# Patient Record
Sex: Male | Born: 1950 | Hispanic: No | Marital: Married | Smoking: Never smoker
Health system: Southern US, Community
[De-identification: ages and names within clinical notes are randomized; demographics above are authoritative.]

## PROBLEM LIST (undated history)

## (undated) DIAGNOSIS — I499 Cardiac arrhythmia, unspecified: Secondary | ICD-10-CM

## (undated) DIAGNOSIS — R002 Palpitations: Secondary | ICD-10-CM

## (undated) DIAGNOSIS — F32A Depression, unspecified: Secondary | ICD-10-CM

## (undated) DIAGNOSIS — I480 Paroxysmal atrial fibrillation: Secondary | ICD-10-CM

## (undated) DIAGNOSIS — F329 Major depressive disorder, single episode, unspecified: Secondary | ICD-10-CM

## (undated) DIAGNOSIS — M1990 Unspecified osteoarthritis, unspecified site: Secondary | ICD-10-CM

## (undated) HISTORY — DX: Palpitations: R00.2

## (undated) HISTORY — DX: Depression, unspecified: F32.A

## (undated) HISTORY — DX: Major depressive disorder, single episode, unspecified: F32.9

## (undated) HISTORY — DX: Paroxysmal atrial fibrillation: I48.0

## (undated) HISTORY — PX: INGUINAL HERNIA REPAIR: SUR1180

## (undated) HISTORY — PX: TOE SURGERY: SHX1073

---

## 2003-05-12 ENCOUNTER — Encounter: Payer: Self-pay | Admitting: Endocrinology

## 2003-05-12 ENCOUNTER — Encounter: Admission: RE | Admit: 2003-05-12 | Discharge: 2003-05-12 | Payer: Self-pay | Admitting: Endocrinology

## 2004-08-28 ENCOUNTER — Ambulatory Visit (HOSPITAL_COMMUNITY): Admission: RE | Admit: 2004-08-28 | Discharge: 2004-08-28 | Payer: Self-pay | Admitting: Gastroenterology

## 2008-05-18 ENCOUNTER — Ambulatory Visit: Payer: Self-pay | Admitting: Cardiology

## 2008-11-10 ENCOUNTER — Ambulatory Visit: Payer: Self-pay | Admitting: Cardiology

## 2011-02-13 NOTE — Assessment & Plan Note (Signed)
[facility]                            CARDIOLOGY OFFICE NOTE   NAME:Bush, Nathaniel                       MRN:          [id number]  DATE:05/18/2008                            DOB:          01/11/1951    I was asked by Dr. Corrin Parker to evaluate Nathaniel Bush with  intermittent palpitations.   HISTORY OF PRESENT ILLNESS:  He is 60 years of age, married, and has two  children.  He has been extremely athletic all his life and now enjoys  riding his bike and walk on the treadmill.  He sometimes gets his heart  upto 160 when climbing hills, but does not get short of breath, has no  chest discomfort, no symptoms of angina.  He has had no presyncope or  syncope.  His palpitations are really noted after he stops and cools off  for a couple of hours.  He has had cycles of these, which have been very  disturbing.   He is extremely health conscious, does not really use much caffeine,  drinks glass of wine at a time, but no more, and uses no recreational  products, and does not smoke and never has.   His past medical history; he is currently on risperidone 1 mg a day and  Cymbalta 60 mg a day for obsessive compulsive disorder and history of  depression.   He seems to think and has read that risperidone and alcohol can increase  these.  This is not a consistent finding for him however.   PAST SURGICAL HISTORY:  Had a hernia repair in 2000.   FAMILY HISTORY:  Negative for premature coronary artery disease.   SOCIAL HISTORY:  He is married and has two children.  He is retired  Archivist.   REVIEW OF SYSTEMS:  As noted in the HPI, he has a history of anxiety,  depression, and seasonal allergies.   PHYSICAL EXAM:  VITAL SIGNS:  His blood pressure is 139/83, his pulse 64  and regular, he is 5 feet and 10 inches and weighs 170 pounds.  GENERAL:  He is in great shape for man of his age.  HEENT:  Normocephalic, atraumatic.  PERRLA.  Extraocular movements  are  intact.  Sclerae are clear.  Face symmetry is normal.  NECK:  Carotids upstrokes are equal bilateral without bruits, no JVD.  Thyroid is not enlarged.  Trachea is midline.  LUNGS:  Clear.  HEART:  Regular rate and rhythm.  Soft S1 and S2.  No murmur, rub, or  gallop.  He has a significant sinus arrhythmia when I asked him to  breath in and out.  ABDOMEN:  Soft, good bowel sounds.  There is no pulsatile masses.  There  is no organomegaly.  EXTREMITIES:  No cyanosis, clubbing, or edema.  Pulses are brisk.  NEURO:  Intact.  MUSCULOSKELETAL:  Intact.  SKIN:  Unremarkable.   EKG from Dr. Jerelene Redden office on March 23, 2008, shows sinus bradycardia,  normal PR, QRS, and QTc interval.  He had a rhythm strip that he  obtained on April 07, 2008, when he  was having some of these and it shows  basically sinus arrhythmia.  No PACs.   ASSESSMENT AND PLAN:  I suspect Mr. Yates is having an occasional  premature atrial contraction or he may be having symptoms of having  baseline sinus bradycardia from conditioning on top of the sinus  arrhythmia.  I told this is a sign of good, autonomic and vagal tone,  and actually is associated with a lower risk of sudden cardiac death.   If he starts to have a cycle of these palpitations in the future, he  will call and we will place an event recorder.  I think the yield right  now will be extremely low.  I think after reassurance and reinforcing  the need for him to continue his exercise program, he felt better.  We  will see him back on a p.r.n. basis, otherwise.     Thomas C. Daleen Squibb, MD, [facility]  Electronically Signed    TCW/MedQ  DD: 05/18/2008  DT: 05/19/2008  Job #: [id number]   cc:   Alfonse Alpers. Dagoberto Ligas, M.D.

## 2011-02-16 NOTE — Op Note (Signed)
Nathaniel Bush, Nathaniel Bush              ACCOUNT NO.:  [id number]   MEDICAL RECORD NO.:  [id number]          PATIENT TYPE:  AMB   LOCATION:  ENDO                         FACILITY:  [facility]   PHYSICIAN:  John C. Madilyn Fireman, M.D.    DATE OF BIRTH:  [birth year]   DATE OF PROCEDURE:  08/28/2004  DATE OF DISCHARGE:                                 OPERATIVE REPORT   INDICATIONS FOR PROCEDURE:  Average-risk colon cancer screening in a 60-year-  old patient with no previous screening.   PROCEDURE:  The patient was placed in the left lateral decubitus position  and placed on the pulse monitor with continuous low flow oxygen delivered by  nasal cannula. The patient was sedated with 75 mcg of IV fentanyl and 7.5 mg  IV Versed. The Olympus video colonoscope was inserted into the rectum and  advanced to the cecum, confirmed by translumination of McBurney's point and  visualization of ileocecal valve and appendiceal orifice. The prep was  excellent. The cecum, ascending, transverse, descending, and sigmoid colon  all appeared normal with no masses polyps, diverticula, or other mucosal  abnormalities. The rectum likewise appeared normal, and retroflexed view of  the anus revealed no obvious internal hemorrhoids. The scope was then  withdrawn, and the patient returned to the recovery room in stable  condition. The patient tolerated the procedure well, and there were no  immediate complications.   IMPRESSION:  Normal colonoscopy.   PLAN:  Next colon screening by sigmoidoscopy in 5 years.      JCH/MEDQ  D:  08/28/2004  T:  08/28/2004  Job:  [id number]   cc:   Alfonse Alpers. Dagoberto Ligas, M.D.  [address]  [place]  [place] [address]  Fax: [phone]

## 2012-04-21 ENCOUNTER — Other Ambulatory Visit: Payer: Self-pay | Admitting: Otolaryngology

## 2012-04-21 DIAGNOSIS — J310 Chronic rhinitis: Secondary | ICD-10-CM

## 2012-04-25 ENCOUNTER — Ambulatory Visit
Admission: RE | Admit: 2012-04-25 | Discharge: 2012-04-25 | Disposition: A | Discharge status: Home / Self Care | Payer: 59 | Source: Ambulatory Visit | Attending: Otolaryngology | Admitting: Otolaryngology

## 2012-04-25 DIAGNOSIS — J310 Chronic rhinitis: Secondary | ICD-10-CM

## 2015-01-28 ENCOUNTER — Encounter: Payer: Self-pay | Admitting: Cardiology

## 2015-01-28 ENCOUNTER — Ambulatory Visit (INDEPENDENT_AMBULATORY_CARE_PROVIDER_SITE_OTHER): Payer: 59 | Admitting: Cardiology

## 2015-01-28 VITALS — BP 146/84 | HR 46 | Ht 70.0 in | Wt 172.4 lb

## 2015-01-28 DIAGNOSIS — R001 Bradycardia, unspecified: Secondary | ICD-10-CM | POA: Diagnosis not present

## 2015-01-28 NOTE — Patient Instructions (Signed)
Your physician recommends that you schedule a follow-up appointment AS NEEDED.  

## 2015-01-28 NOTE — Progress Notes (Signed)
Cardiology Office Note   Date:  01/28/2015   ID:  Nathaniel Bush, DOB [birth year], MRN [id number]  PCP:  Ezequiel Kayser, MD  Cardiologist:   Rollene Rotunda, MD   Chief Complaint  Patient presents with  . Bradycardia      History of Present Illness: Nathaniel Bush is a 64 y.o. male who presents for resulting for evaluation of bradycardia. He has no past cardiac history other than palpitations some years ago. He said he wore a monitor for a little while but didn't work very well. He's worried because he has a slow heart rate. His heart rate is in the 40s. He does not however have any symptoms. He doesn't have any presyncope or syncope. He doesn't having chest pressure, neck or arm discomfort. He has no weight gain or edema. He has no shortness of breath, PND or orthopnea. He bikes 5 times per week 20 miles at a time. His heart rate starts low but he can get it up easily and then it calms down quickly.   Past Medical History  Diagnosis Date  . Palpitations   . Depression     Past Surgical History  Procedure Laterality Date  . Inguinal hernia repair    . Toe surgery       Current Outpatient Prescriptions  Medication Sig Dispense Refill  . lithium carbonate (ESKALITH) 450 MG CR tablet Take 450 mg by mouth daily.     No current facility-administered medications for this visit.    Allergies:   Review of patient's allergies indicates no known allergies.    Social History:  The patient  reports that he has never smoked. He does not have any smokeless tobacco history on file.   Family History:  The patient's family history includes Atrial fibrillation in his father.    ROS:  Please see the history of present illness.   Otherwise, review of systems are positive for decreased hearing with hearing aids, urinary frequency..   All other systems are reviewed and negative.    PHYSICAL EXAM: VS:  BP 146/84 mmHg  Pulse 46  Ht  (1.778 m)  Wt 172 lb 6.4 oz (78.2 kg)  BMI  24.74 kg/m2 , BMI Body mass index is 24.74 kg/(m^2). GENERAL:  Well appearing HEENT:  Pupils equal round and reactive, fundi not visualized, oral mucosa unremarkable NECK:  No jugular venous distention, waveform within normal limits, carotid upstroke brisk and symmetric, no bruits, no thyromegaly LYMPHATICS:  No cervical, inguinal adenopathy LUNGS:  Clear to auscultation bilaterally BACK:  No CVA tenderness CHEST:  Unremarkable HEART:  PMI not displaced or sustained,S1 and S2 within normal limits, no S3, no S4, no clicks, no rubs, no murmurs ABD:  Flat, positive bowel sounds normal in frequency in pitch, no bruits, no rebound, no guarding, no midline pulsatile mass, no hepatomegaly, no splenomegaly EXT:  2 plus pulses throughout, no edema, no cyanosis no clubbing SKIN:  No rashes no nodules NEURO:  Cranial nerves II through XII grossly intact, motor grossly intact throughout PSYCH:  Cognitively intact, oriented to person place and time    EKG:  EKG is ordered today. The ekg ordered today demonstrates sinus bradycardia, rate 46, left axis deviation, no acute ST-T wave changes.  01/28/2015   Recent Labs: No results found for requested labs within last 365 days.    Lipid Panel No results found for: CHOL, TRIG, HDL, CHOLHDL, VLDL, LDLCALC, LDLDIRECT    Wt Readings from Last 3 Encounters:  01/28/15  172 lb 6.4 oz (78.2 kg)      Other studies Reviewed: Additional studies/ records that were reviewed today include: Office labs. Review of the above records demonstrates:  Please see elsewhere in the note.     ASSESSMENT AND PLAN:  Sinus bradycardia: This is a heightened vagal tone likely related to his endurance training. He seems this directly to have chronotropic competence. He has no symptoms. No further testing is indicated. We discussed this at length. Of note his thyroid studies were recently normal.  HTN:  His blood pressure is slightly elevated but he says this is unusual.  He'll keep an eye on this. No change in therapy is indicated.   Current medicines are reviewed at length with the patient today.  The patient does not have concerns regarding medicines.  The following changes have been made:  no change  Labs/ tests ordered today include: None  No orders of the defined types were placed in this encounter.     Disposition:   FU with me as needed.     Signed, Rollene RotundaJames Sanaz Scarlett, MD  01/28/2015 9:49 AM    [facility]

## 2018-02-06 ENCOUNTER — Encounter: Payer: Self-pay | Admitting: Cardiology

## 2018-02-06 NOTE — Progress Notes (Signed)
Cardiology Office Note   Date:  02/07/2018   ID:  Nathaniel Bush, DOB [birth year], MRN [id number]  PCP:  Rodrigo Ran, MD  Cardiologist:   Rollene Rotunda, MD   No chief complaint on file.     History of Present Illness: Nathaniel Bush is a 67 y.o. male who presents for resulting for evaluation of palpitations and low BP.  I last saw him over three years ago for evaluation of bradycardia.   At that time he had no significant symptoms so no further work up was indicated.   He called with complaints of an episode earlier this week of lightheadedness and palpitations.  He reports that he walked his dog.  He did his chores.  However, when he went to ride his bike he felt poorly.  Trouble pedaling the uphill portion out of his neighborhood.  He felt his arms tingling.  His legs were blotchy.  When he got home he sat down to take his pulse and he could not feel this.  Wife who was telemetry nurse came home many hours later and said that his heart rate go from the 50s to 120s.  The BP was 80/50.  His wife gave him a Xanax and he did have improvement.  Not seek medical attention at that time.  However, he has been quite anxious about this.   He has had some orthostatic symptoms.  He has not had any frank syncope.  He denies any chest pressure, neck or arm discomfort.  He has had no new shortness of breath, PND or orthopnea.   Past Medical History:  Diagnosis Date  . Depression   . Palpitations      Current Outpatient Medications  Medication Sig Dispense Refill  . LITHIUM CARBONATE ER PO Take 700 mg by mouth daily.      No current facility-administered medications for this visit.     Allergies:   Patient has no known allergies.    Social History:  The patient  reports that he has never smoked. He has never used smokeless tobacco.   Family History:  The patient's family history includes Atrial fibrillation in his father; Heart disease in his paternal grandmother.    ROS:  Please see  the history of present illness.   Otherwise, review of systems are positive for none..   All other systems are reviewed and negative.    PHYSICAL EXAM: VS:  BP 128/74   Pulse (!) 50   Ht  (1.753 m)   Wt 182 lb 12.8 oz (82.9 kg)   BMI 26.99 kg/m  , BMI Body mass index is 26.99 kg/m.  GENERAL:  Well appearing HEENT:  Pupils equal round and reactive, fundi not visualized, oral mucosa unremarkable NECK:  No jugular venous distention, waveform within normal limits, carotid upstroke brisk and symmetric, no bruits, no thyromegaly LYMPHATICS:  No cervical, inguinal adenopathy LUNGS:  Clear to auscultation bilaterally BACK:  No CVA tenderness CHEST:  Unremarkable HEART:  PMI not displaced or sustained,S1 and S2 within normal limits, no S3, no S4, no clicks, no rubs, no murmurs ABD:  Flat, positive bowel sounds normal in frequency in pitch, no bruits, no rebound, no guarding, no midline pulsatile mass, no hepatomegaly, no splenomegaly EXT:  2 plus pulses throughout, no edema, no cyanosis no clubbing SKIN:  No rashes no nodules NEURO:  Cranial nerves II through XII grossly intact, motor grossly intact throughout PSYCH:  Cognitively intact, oriented to person place and time  EKG:  EKG is  ordered today. The ekg ordered today demonstrates sinus bradycardia, rate 50, left axis deviation, no acute ST-T wave changes.  Poor anterior R wave progression, sinus arrhythmia.  02/07/2018   Recent Labs: No results found for requested labs within last 8760 hours.    Lipid Panel No results found for: CHOL, TRIG, HDL, CHOLHDL, VLDL, LDLCALC, LDLDIRECT    Wt Readings from Last 3 Encounters:  02/07/18 182 lb 12.8 oz (82.9 kg)  01/28/15 172 lb 6.4 oz (78.2 kg)      Other studies Reviewed: Additional studies/ records that were reviewed today include:  Review of the above records demonstrates:  Please see elsewhere in the note.     ASSESSMENT AND PLAN:  PALPITATIONS: I am unable to  understand exactly what happened the day he was feeling poorly.  We talked at length about getting this recorded by going to an urgent care or calling EMS.  He is also going to buy Apple watch.  This will allow [place] to check his arrhythmia in the future.  Given the fact that he had symptoms with exercise I am going to bring him back for a POET (Plain Old Exercise Treadmill).  Of note he was not orthostatic in the office.  Sinus bradycardia:   This will be monitored as above.  I had a long conversation about the fact that there is not an indication at this point for a pacemaker.   HTN:  The blood pressure is at target. No change in medications is indicated. We will continue with therapeutic lifestyle changes (TLC).  Current medicines are reviewed at length with the patient today.  The patient does not have concerns regarding medicines.  The following changes have been made:  None  Labs/ tests ordered today include:   Orders Placed This Encounter  Procedures  . EXERCISE TOLERANCE TEST (ETT)  . EKG 12-Lead     Disposition:   FU with me in one month.     Signed, Rollene Rotunda, MD  02/07/2018 1:16 PM    [facility]

## 2018-02-07 ENCOUNTER — Ambulatory Visit: Payer: Medicare Other | Admitting: Cardiology

## 2018-02-07 ENCOUNTER — Encounter: Payer: Self-pay | Admitting: Cardiology

## 2018-02-07 VITALS — BP 128/74 | HR 50 | Ht 69.0 in | Wt 182.8 lb

## 2018-02-07 DIAGNOSIS — R002 Palpitations: Secondary | ICD-10-CM | POA: Diagnosis not present

## 2018-02-07 DIAGNOSIS — R001 Bradycardia, unspecified: Secondary | ICD-10-CM

## 2018-02-07 DIAGNOSIS — R42 Dizziness and giddiness: Secondary | ICD-10-CM

## 2018-02-07 NOTE — Patient Instructions (Signed)
Medication Instructions:  Continue current medications  If you need a refill on your cardiac medications before your next appointment, please call your pharmacy.  Labwork: None Ordered  Testing/Procedures: Your physician has requested that you have an exercise tolerance test. For further information please visit [email or web address] Please also follow instruction sheet, as given.   Follow-Up: Your physician wants you to follow-up in: 1 Month.      Thank you for choosing [facility] at [facility]!!

## 2018-02-11 ENCOUNTER — Telehealth (HOSPITAL_COMMUNITY): Payer: Self-pay

## 2018-02-11 NOTE — Telephone Encounter (Signed)
Encounter complete. 

## 2018-02-12 ENCOUNTER — Ambulatory Visit (HOSPITAL_COMMUNITY)
Admission: RE | Admit: 2018-02-12 | Discharge: 2018-02-12 | Disposition: A | Discharge status: Home / Self Care | Payer: Medicare Other | Source: Ambulatory Visit | Attending: Internal Medicine | Admitting: Internal Medicine

## 2018-02-12 ENCOUNTER — Encounter (HOSPITAL_COMMUNITY): Payer: Self-pay | Admitting: *Deleted

## 2018-02-12 DIAGNOSIS — R42 Dizziness and giddiness: Secondary | ICD-10-CM | POA: Diagnosis not present

## 2018-02-12 LAB — EXERCISE TOLERANCE TEST
CSEPEDS: 0 s
CSEPEW: 15.3 METS
CSEPHR: 91 %
CSEPPHR: 141 {beats}/min
Exercise duration (min): 13 min
MPHR: 154 {beats}/min
RPE: 18
Rest HR: 51 {beats}/min

## 2018-02-12 NOTE — Progress Notes (Unsigned)
Patient was asymptomatic during ETT STUDY.Slowi ncrease in his heart rate was noted and viewed by Dr. Rennis Golden.

## 2018-02-13 ENCOUNTER — Telehealth: Payer: Self-pay | Admitting: Cardiology

## 2018-02-13 NOTE — Telephone Encounter (Signed)
Pt is calling  Pt is returning call and stated to please leave the message if he don't pick up.

## 2018-02-13 NOTE — Telephone Encounter (Signed)
Pt notified forwarded to PCP as directed ----------- Notes recorded by Barrie Dunker on 02/13/2018 at 11:03 AM EDT Leave message for pt to call back ------  Notes recorded by Rollene Rotunda, MD on 02/12/2018 at 10:57 PM EDT Normal POET (Plain Old Exercise Treadmill). Plan as outlined in the note.  Call Mr. Buehrer with the results and send results to Rodrigo Ran, MD

## 2018-03-19 NOTE — Progress Notes (Signed)
Cardiology Office Note   Date:  03/21/2018   ID:  Nathaniel Bush, DOB 1951-01-19, MRN [id number]  PCP:  Nathaniel Ran, MD  Cardiologist:   Rollene Rotunda, MD   Chief Complaint  Patient presents with  . Palpitations      History of Present Illness: Nathaniel Bush is a 67 y.o. male who presents for resulting for evaluation of palpitations and low BP.  I saw him recently for this and he had no abnormalities on POET (Plain Old Exercise Treadmill).  He had an excellent exercise tolerance.  He has an Apple watch now.  He has had none of the episodes that he had previously.  He has had no tachypalpitations.  He has had a normal heart rate response to exercise.  He has had low heart rates into the 30s at night but nothing during the day.  He has had no presyncope or syncope.  He said no chest pain or shortness of breath.  He is concerned about the low heart rates.  His wife was a Set designer and is concerned about this.  He has some mild orthostatic symptoms only if he stands up from his recliner.    Past Medical History:  Diagnosis Date  . Depression   . Palpitations      Current Outpatient Medications  Medication Sig Dispense Refill  . lithium carbonate (ESKALITH) 450 MG CR tablet Take 1 tablet by mouth daily.    Marland Kitchen lithium carbonate 300 MG capsule Take 1 capsule by mouth daily.     No current facility-administered medications for this visit.     Allergies:   Patient has no known allergies.    ROS:  Please see the history of present illness.   Otherwise, review of systems are positive for none..   All other systems are reviewed and negative.    PHYSICAL EXAM: VS:  BP 122/76   Pulse 60   Ht 5\' 10"  (1.778 m)   Wt 181 lb (82.1 kg)   BMI 25.97 kg/m  , BMI Body mass index is 25.97 kg/m.  GENERAL:  Well appearing NECK:  No jugular venous distention, waveform within normal limits, carotid upstroke brisk and symmetric, no bruits, no thyromegaly LUNGS:  Clear to auscultation  bilaterally CHEST:  Unremarkable HEART:  PMI not displaced or sustained,S1 and S2 within normal limits, no S3, no S4, no clicks, no rubs, no murmurs ABD:  Flat, positive bowel sounds normal in frequency in pitch, no bruits, no rebound, no guarding, no midline pulsatile mass, no hepatomegaly, no splenomegaly EXT:  2 plus pulses throughout, no edema, no cyanosis no clubbing   EKG:  EKG is  not ordered today.    Recent Labs: No results found for requested labs within last 8760 hours.    Lipid Panel No results found for: CHOL, TRIG, HDL, CHOLHDL, VLDL, LDLCALC, LDLDIRECT    Wt Readings from Last 3 Encounters:  03/21/18 181 lb (82.1 kg)  02/07/18 182 lb 12.8 oz (82.9 kg)  01/28/15 172 lb 6.4 oz (78.2 kg)      Other studies Reviewed: Additional studies/ records that were reviewed today include: POET (Plain Old Exercise Treadmill) Review of the above records demonstrates:     ASSESSMENT AND PLAN:  PALPITATIONS:    He has not had any tachypalpitations.  He will continue to monitor with his Apple watch.   Sinus bradycardia:  I will check a 24 hour Holter given the recorded bradycardia.    HTN:  The blood  pressure is at target.  No change in therapy.   Current medicines are reviewed at length with the patient today.  The patient has no concerns regarding medicines.  The following changes have been made:  None  Labs/ tests ordered today include:   Orders Placed This Encounter  Procedures  . HOLTER MONITOR - 24 HOUR     Disposition:   FU with me as needed.   Signed, Rollene RotundaJames Hanadi Stanly, MD  03/21/2018 8:46 AM    [facility]

## 2018-03-21 ENCOUNTER — Ambulatory Visit: Payer: Medicare Other | Admitting: Cardiology

## 2018-03-21 ENCOUNTER — Encounter: Payer: Self-pay | Admitting: Cardiology

## 2018-03-21 VITALS — BP 122/76 | HR 60 | Ht 70.0 in | Wt 181.0 lb

## 2018-03-21 DIAGNOSIS — R002 Palpitations: Secondary | ICD-10-CM | POA: Diagnosis not present

## 2018-03-21 DIAGNOSIS — I10 Essential (primary) hypertension: Secondary | ICD-10-CM

## 2018-03-21 DIAGNOSIS — R001 Bradycardia, unspecified: Secondary | ICD-10-CM

## 2018-03-21 NOTE — Patient Instructions (Signed)
Medication Instructions:  Continue current medications  If you need a refill on your cardiac medications before your next appointment, please call your pharmacy.  Labwork: None Ordered   Testing/Procedures: Your physician has recommended that you wear a holter monitor for 24 hours. Holter monitors are medical devices that record the heart's electrical activity. Doctors most often use these monitors to diagnose arrhythmias. Arrhythmias are problems with the speed or rhythm of the heartbeat. The monitor is a small, portable device. You can wear one while you do your normal daily activities. This is usually used to diagnose what is causing palpitations/syncope (passing out).   Follow-Up: Your physician wants you to follow-up in: As Needed.     Thank you for choosing [facility]!!

## 2018-03-31 ENCOUNTER — Other Ambulatory Visit: Payer: Self-pay | Admitting: Cardiology

## 2018-03-31 ENCOUNTER — Ambulatory Visit (INDEPENDENT_AMBULATORY_CARE_PROVIDER_SITE_OTHER): Payer: Medicare Other

## 2018-03-31 DIAGNOSIS — R001 Bradycardia, unspecified: Secondary | ICD-10-CM | POA: Diagnosis not present

## 2018-03-31 DIAGNOSIS — R002 Palpitations: Secondary | ICD-10-CM | POA: Diagnosis not present

## 2018-04-14 ENCOUNTER — Encounter: Payer: Self-pay | Admitting: Cardiology

## 2018-04-14 ENCOUNTER — Telehealth: Payer: Self-pay | Admitting: Cardiology

## 2018-04-14 NOTE — Telephone Encounter (Signed)
New Message:     Pt said he suffers with AFIB and Dr  LionsHochrien told him to run strips  From his Apple Watch and send to him. Pt says he does not know how to do this. He had an episode yesterday.

## 2018-04-14 NOTE — Telephone Encounter (Signed)
Patient called in asking how to send his EKG readings to the provider. He has been given a new My Chart code in order to activate MyChart so that he may send his readings. He verbalized his understanding.

## 2018-04-15 NOTE — Telephone Encounter (Signed)
I reviewed the monitor and he indeed does have atrial fib on his Alive Cor.  He has a Mr. Debroah BallerDavid Ensey has a CHA2DS2 - VASc score of 1.  He will start ASA 81 mg.  I would like to order an echo and then schedule follow up with me.

## 2018-04-24 ENCOUNTER — Encounter: Payer: Self-pay | Admitting: Cardiology

## 2018-04-24 DIAGNOSIS — I480 Paroxysmal atrial fibrillation: Secondary | ICD-10-CM

## 2018-04-25 NOTE — Telephone Encounter (Signed)
Order for Echo placed and send to scheduling

## 2018-04-25 NOTE — Telephone Encounter (Signed)
MESSAGE SENT TO SCHEDULING 

## 2018-04-29 ENCOUNTER — Other Ambulatory Visit: Payer: Self-pay

## 2018-04-29 ENCOUNTER — Ambulatory Visit (HOSPITAL_COMMUNITY): Payer: Medicare Other | Attending: Cardiology

## 2018-04-29 DIAGNOSIS — I081 Rheumatic disorders of both mitral and tricuspid valves: Secondary | ICD-10-CM | POA: Diagnosis not present

## 2018-04-29 DIAGNOSIS — I4891 Unspecified atrial fibrillation: Secondary | ICD-10-CM | POA: Diagnosis not present

## 2018-04-29 DIAGNOSIS — I480 Paroxysmal atrial fibrillation: Secondary | ICD-10-CM | POA: Diagnosis not present

## 2018-04-30 ENCOUNTER — Encounter: Payer: Self-pay | Admitting: Cardiology

## 2018-05-14 ENCOUNTER — Encounter: Payer: Self-pay | Admitting: Cardiology

## 2018-05-14 NOTE — Progress Notes (Signed)
Cardiology Office Note   Date:  05/15/2018   ID:  Nathaniel BallerDavid Bush, DOB November 15, 1950, MRN [id number]  PCP:  Rodrigo RanPerini, Mark, MD  Cardiologist:   Rollene RotundaJames Jayln Madeira, MD   Chief Complaint  Patient presents with  . Atrial Fibrillation      History of Present Illness: Nathaniel Bush is a 67 y.o. male who presents for resulting for evaluation of palpitations and low BP.  I saw him recently for this and he had no abnormalities on POET (Plain Old Exercise Treadmill).  He had an excellent exercise tolerance.  He has an Apple watch now.  He was found to have atrial fib. Nathaniel Bush has a CHA2DS2 - VASc score of 1.   Echo was essentially unremarkable.    Since the one event recorded and verified at this appt he has had no further episodes.  The patient denies any new symptoms such as chest discomfort, neck or arm discomfort. There has been no new shortness of breath, PND or orthopnea. There have been no reported palpitations, presyncope or syncope.  He is active riding his bike.  He is very nervous about this event.    Past Medical History:  Diagnosis Date  . Depression   . PAF (paroxysmal atrial fibrillation) (HCC)   . Palpitations      Current Outpatient Medications  Medication Sig Dispense Refill  . lithium carbonate (ESKALITH) 450 MG CR tablet Take 1 tablet by mouth daily.    Marland Kitchen. lithium carbonate 300 MG capsule Take 1 capsule by mouth daily.     No current facility-administered medications for this visit.     Allergies:   Patient has no known allergies.    ROS:  Please see the history of present illness.   Otherwise, review of systems are positive for none..   All other systems are reviewed and negative.    PHYSICAL EXAM: VS:  BP 130/74   Pulse (!) 52   Ht 5\' 9"  (1.753 m)   Wt 177 lb 3.2 oz (80.4 kg)   BMI 26.17 kg/m  , BMI Body mass index is 26.17 kg/m.  GENERAL:  Well appearing NECK:  No jugular venous distention, waveform within normal limits, carotid upstroke brisk  and symmetric, no bruits, no thyromegaly LUNGS:  Clear to auscultation bilaterally CHEST:  Unremarkable HEART:  PMI not displaced or sustained,S1 and S2 within normal limits, no S3, no S4, no clicks, no rubs, no murmurs ABD:  Flat, positive bowel sounds normal in frequency in pitch, no bruits, no rebound, no guarding, no midline pulsatile mass, no hepatomegaly, no splenomegaly EXT:  2 plus pulses throughout, no edema, no cyanosis no clubbing    EKG:  EKG is not ordered today.    Recent Labs: No results found for requested labs within last 8760 hours.    Lipid Panel No results found for: CHOL, TRIG, HDL, CHOLHDL, VLDL, LDLCALC, LDLDIRECT    Wt Readings from Last 3 Encounters:  05/15/18 177 lb 3.2 oz (80.4 kg)  03/21/18 181 lb (82.1 kg)  02/07/18 182 lb 12.8 oz (82.9 kg)      Other studies Reviewed: Additional studies/ records that were reviewed today include: Apple watch recording Review of the above records demonstrates:     ASSESSMENT AND PLAN:  PAF:   Nathaniel Bush has a CHA2DS2 - VASc score of 1.    He has not had any tachypalpitations.  He will continue to monitor with his Apple watch.   No change in therapy.  SINUS BRADYCARDIA:  There were no bradycardias.    No further evaluation.   HTN:    BP is at target.  No change in therapy.   Current medicines are reviewed at length with the patient today.  The patient has no concerns regarding medicines.  The following changes have been made:  None  Labs/ tests ordered today include: None  No orders of the defined types were placed in this encounter.    Disposition:   FU with me in six months.   Signed, Rollene RotundaJames Javiel Canepa, MD  05/15/2018 5:20 PM    [facility]

## 2018-05-15 ENCOUNTER — Encounter: Payer: Self-pay | Admitting: Cardiology

## 2018-05-15 ENCOUNTER — Ambulatory Visit: Payer: Medicare Other | Admitting: Cardiology

## 2018-05-15 VITALS — BP 130/74 | HR 52 | Ht 69.0 in | Wt 177.2 lb

## 2018-05-15 DIAGNOSIS — I480 Paroxysmal atrial fibrillation: Secondary | ICD-10-CM | POA: Diagnosis not present

## 2018-05-15 NOTE — Patient Instructions (Signed)
Medication Instructions:  Continue current medications  If you need a refill on your cardiac medications before your next appointment, please call your pharmacy.  Labwork: None Ordered   Testing/Procedures: None Ordered   Follow-Up: Your physician wants you to follow-up in: 6 Months You should receive a reminder letter in the mail two months in advance. If you do not receive a letter, please call our office in [phone] to schedule your follow-up appointment.     Thank you for choosing [facility]!!       

## 2018-08-21 ENCOUNTER — Encounter: Payer: Self-pay | Admitting: Psychiatry

## 2018-08-21 ENCOUNTER — Ambulatory Visit: Payer: Medicare Other | Admitting: Psychiatry

## 2018-08-21 DIAGNOSIS — F4310 Post-traumatic stress disorder, unspecified: Secondary | ICD-10-CM | POA: Diagnosis not present

## 2018-08-21 DIAGNOSIS — F411 Generalized anxiety disorder: Secondary | ICD-10-CM

## 2018-08-21 DIAGNOSIS — F319 Bipolar disorder, unspecified: Secondary | ICD-10-CM

## 2018-08-21 NOTE — Progress Notes (Signed)
Nathaniel Bush [id number] Oct 06, 1950 67 y.o.  Subjective:   [name] is a 67 y.o. (DOB 1951-06-10) male.  Chief Complaint:  Chief Complaint  Patient presents with  . Depression  . Anxiety    HPI Nathaniel Bush presents to the office today for follow-up of depression and anxiety.  Got really anxious and felt driven and distracted from the lamotrigine so stopped it and it went away.  Had started Lamotrigine bc was feeling blue in the morning and it helped those sx.  Maybe the Lamictal would have worked at 25mg .  Off the lamotrigine the drivenness is gone.  Started having unwanted hypersexual thoughts, intrusive and persistant out of the blue in early October after he had already stopped the lamotrigine.  So he put up with it for 2 weeks, then about Oct 21 he stopped the 300mg  lithium for 10 days.  By the end of the 10 days the thoughts were gone.  Then he restarted the 300mg  lithium with the 450 and has been fine since then. Denies other hypomanic sx when he had the hypersexual thoughts.  The thoughts started milder about a month before he went to [place].  Got worse when he went to [place] and it was wearing him down.  thougts were weird bc they were directed in any woman not specific attractive women.  Didn't like it. Not a fetish either.  Hasn't had these thoughts for several years, but had an episode like this several years ago. No trigger for the thoughts.  Not depressed. Patient reports stable mood and denies depressed or irritable moods.  Patient denies any recent difficulty with anxiety.  Patient denies difficulty with sleep initiation or maintenance. Denies appetite disturbance.  Patient reports that energy and motivation have been good.  Patient denies any difficulty with concentration.  Patient denies any suicidal ideation.  Past psychiatric medication trials include in Vega, Cerefolin NAC, Deplin, Viibryd, mirtazapine, Cytomel, lamotrigine, venlafaxine, buspirone,  carbamazepine, Trileptal, risperidone, Abilify, nefazodone, mirtazapine, Viibryd which cause GI pain, trazodone 25 mg which cause manic symptoms, Wellbutrin, duloxetine, Celexa, Lexapro, propranolol Zoloft, Wellbutrin, Trintellix.  I am uncertain if he is taking Depakote before  Review of Systems:  Review of Systems  Neurological: Negative for tremors and weakness.  Psychiatric/Behavioral: Negative for agitation, behavioral problems, confusion, decreased concentration, dysphoric mood, hallucinations, self-injury, sleep disturbance and suicidal ideas. The patient is not nervous/anxious and is not hyperactive.     Medications: I have reviewed the patient's current medications.  Current Outpatient Medications  Medication Sig Dispense Refill  . lithium carbonate (ESKALITH) 450 MG CR tablet Take 1 tablet by mouth daily.    Marland Kitchen lithium carbonate 300 MG capsule Take 1 capsule by mouth daily.    Marland Kitchen lamoTRIgine (LAMICTAL) 25 MG tablet Take 25 mg by mouth 2 (two) times daily.     No current facility-administered medications for this visit.     Medication Side Effects: None  Allergies: No Known Allergies  Past Medical History:  Diagnosis Date  . Depression   . PAF (paroxysmal atrial fibrillation) (HCC)   . Palpitations     Family History  Problem Relation Age of Onset  . Atrial fibrillation Father        pacemaker  . Heart disease Paternal Grandmother        pacemaker    Social History   Socioeconomic History  . Marital status: Married    Spouse name: Not on file  . Number of children: 2  . Years  of education: Not on file  . Highest education level: Not on file  Occupational History  . Not on file  Social Needs  . Financial resource strain: Not on file  . Food insecurity:    Worry: Not on file    Inability: Not on file  . Transportation needs:    Medical: Not on file    Non-medical: Not on file  Tobacco Use  . Smoking status: Never Smoker  . Smokeless tobacco: Never Used   Substance and Sexual Activity  . Alcohol use: Not on file  . Drug use: Not on file  . Sexual activity: Not on file  Lifestyle  . Physical activity:    Days per week: Not on file    Minutes per session: Not on file  . Stress: Not on file  Relationships  . Social connections:    Talks on phone: Not on file    Gets together: Not on file    Attends religious service: Not on file    Active member of club or organization: Not on file    Attends meetings of clubs or organizations: Not on file    Relationship status: Not on file  . Intimate partner violence:    Fear of current or ex partner: Not on file    Emotionally abused: Not on file    Physically abused: Not on file    Forced sexual activity: Not on file  Other Topics Concern  . Not on file  Social History Narrative   Lives at home with wife.  Retired Futures traderpolice detective.     Past Medical History, Surgical history, Social history, and Family history were reviewed and updated as appropriate.   W has no immediate plans to retire.  Please see review of systems for further details on the patient's review from today.   Objective:   Physical Exam:  There were no vitals taken for this visit.  Physical Exam  Constitutional: He is oriented to person, place, and time. He appears well-developed. No distress.  Musculoskeletal: He exhibits no deformity.  Neurological: He is alert and oriented to person, place, and time. He displays no tremor. Coordination and gait normal.  Psychiatric: He has a normal mood and affect. His speech is normal and behavior is normal. Judgment and thought content normal. His mood appears not anxious. His affect is not angry, not blunt, not labile and not inappropriate. Cognition and memory are normal. He does not exhibit a depressed mood. He expresses no homicidal and no suicidal ideation. He expresses no suicidal plans and no homicidal plans.  Insight intact. No auditory or visual hallucinations.  Recent  intrusive thoughts resolved.  He is attentive.    Lab Review:  No results found for: NA, K, CL, CO2, GLUCOSE, BUN, CREATININE, CALCIUM, PROT, ALBUMIN, AST, ALT, ALKPHOS, BILITOT, GFRNONAA, GFRAA  No results found for: WBC, RBC, HGB, HCT, PLT, MCV, MCH, MCHC, RDW, LYMPHSABS, MONOABS, EOSABS, BASOSABS  No results found for: POCLITH, LITHIUM   No results found for: PHENYTOIN, PHENOBARB, VALPROATE, CBMZ  Last lithium level May 13, 2018 on 750 mg lithium per day was 0.7  .res Assessment: Plan:    Bipolar affective disorder, rapid cycling (HCC)  Generalized anxiety disorder  PTSD (post-traumatic stress disorder)   Medication sensitive.  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  He is medication sensitive and is not typically stable for very long.  Reviewed the long list of prior psychiatric medications.  Consider retrying Depakote that  was not tried before because of the rapid cycling aspect of his illness.  He has some benefit from the lithium for his depression which is very helpful.  Her lithium level is stable.  He is currently stable as of 3 weeks.  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  Possible trigger was coming off the lamotrigine given no other recent change.  Discussed the other idea that somehow this is related to PTSD from working the police department but we could identify no trigger.  This appt was 30 mins.  FU 2 mos and need to repeat lithium level.  Meredith Staggers, MD, DFAPA  Please see After Visit Summary for patient specific instructions.  Future Appointments  Date Time Provider Department Center  10/21/2018  3:30 PM Cottle, Steva Ready., MD [facility] None    No orders of the defined types were placed in this encounter.     -------------------------------

## 2018-09-17 ENCOUNTER — Telehealth: Payer: Self-pay | Admitting: Psychiatry

## 2018-09-19 NOTE — Telephone Encounter (Signed)
Error

## 2018-09-22 ENCOUNTER — Telehealth: Payer: Self-pay | Admitting: Psychiatry

## 2018-09-22 ENCOUNTER — Other Ambulatory Visit: Payer: Self-pay

## 2018-09-22 MED ORDER — LITHIUM CARBONATE 300 MG PO CAPS: 300.0000 mg | ORAL_CAPSULE | Freq: Every day | ORAL | 1 refills | Status: DC

## 2018-09-22 MED ORDER — LITHIUM CARBONATE ER 450 MG PO TBCR: 450.0000 mg | EXTENDED_RELEASE_TABLET | Freq: Every day | ORAL | 1 refills | Status: DC

## 2018-09-22 NOTE — Telephone Encounter (Signed)
done

## 2018-09-22 NOTE — Telephone Encounter (Signed)
°  Pt. Called and said that he needs a refill of lithium 450 mg and lithium 300mg  sent [facility]

## 2018-10-21 ENCOUNTER — Encounter: Payer: Self-pay | Admitting: Psychiatry

## 2018-10-21 ENCOUNTER — Ambulatory Visit: Payer: Medicare Other | Admitting: Psychiatry

## 2018-10-21 DIAGNOSIS — F411 Generalized anxiety disorder: Secondary | ICD-10-CM | POA: Diagnosis not present

## 2018-10-21 DIAGNOSIS — F319 Bipolar disorder, unspecified: Secondary | ICD-10-CM | POA: Diagnosis not present

## 2018-10-21 DIAGNOSIS — F4310 Post-traumatic stress disorder, unspecified: Secondary | ICD-10-CM | POA: Diagnosis not present

## 2018-10-21 NOTE — Progress Notes (Signed)
Nathaniel Bush [id number] [birth year] 68 y.o.  Subjective:   [name] is a 68 y.o. (DOB [birth year]) male.  Chief Complaint:  Chief Complaint  Patient presents with  . Follow-up    Medication management    HPI last seen August 21, 2018 [name] presents to the office today for follow-up of depression and anxiety.     No med changes made at the last visit bc of the following history before the last visit: Got really anxious and felt driven and distracted from the lamotrigine so stopped it and it went away.  Had started Lamotrigine bc was feeling blue in the morning and it helped those sx.  Maybe the Lamictal would have worked at 25mg .  Off the lamotrigine the drivenness was gone.  Started having unwanted hypersexual thoughts, intrusive and persistant out of the blue in early October after he had already stopped the lamotrigine.  So he put up with it for 2 weeks, then about Oct 21 he stopped the 300mg  lithium for 10 days.  By the end of the 10 days the thoughts were gone.  Then he restarted the 300mg  lithium with the 450 and has been fine since then. Denies other hypomanic sx when he had the hypersexual thoughts.  The thoughts started milder about a month before he went to [place].  Got worse when he went to [place] and it was wearing him down.  thougts were weird bc they were directed in any woman not specific attractive women.  Didn't like it. Not a fetish either.  Hasn't had these thoughts for several years, but had an episode like this several years ago. No trigger for the thoughts.  Got through the holidays ok but it's hard bc feels wife goes overboard on decorations.  She's hyper and intense.  I try to fix stuff.  Not like a couple we just live together and it irritates him bc she's not attentive.  Big argument Dec 21.  Had lithium level checked and it was stable at 0.8.  Recognizes he over thinks and wife complains of it too.  Frustrated wife won't have sex.   Counseling a long while ago did not help bc one or the other would leave.  Not depressed but frustrated with marriage. Patient reports stable mood and denies depressed or irritable moods.  Patient denies any recent difficulty with anxiety.  Patient denies difficulty with sleep initiation or maintenance. Denies appetite disturbance.  Patient reports that energy and motivation have been good.  Patient denies any difficulty with concentration.  Patient denies any suicidal ideation.  Past psychiatric medication trials include in Vega, Cerefolin NAC, Deplin, Viibryd, mirtazapine, Cytomel, lamotrigine, venlafaxine, buspirone, carbamazepine, Trileptal, risperidone, Abilify, nefazodone, mirtazapine, Viibryd which cause GI pain, trazodone 25 mg which cause manic symptoms, Wellbutrin, duloxetine, Celexa, Lexapro, propranolol Zoloft, Wellbutrin, Trintellix.  I am uncertain if he is taking Depakote before  Review of Systems:  Review of Systems  Neurological: Negative for tremors and weakness.  Psychiatric/Behavioral: Negative for agitation, behavioral problems, confusion, decreased concentration, dysphoric mood, hallucinations, self-injury, sleep disturbance and suicidal ideas. The patient is not nervous/anxious and is not hyperactive.     Medications: I have reviewed the patient's current medications.  Current Outpatient Medications  Medication Sig Dispense Refill  . b complex vitamins capsule Take 1 capsule by mouth daily.    Marland Kitchen lithium carbonate (ESKALITH) 450 MG CR tablet Take 1 tablet (450 mg total) by mouth daily. 90 tablet 1  . lithium carbonate 300  MG capsule Take 1 capsule (300 mg total) by mouth daily. 90 capsule 1  . lamoTRIgine (LAMICTAL) 25 MG tablet Take 25 mg by mouth 2 (two) times daily.     No current facility-administered medications for this visit.     Medication Side Effects: None  Allergies: No Known Allergies  Past Medical History:  Diagnosis Date  . Depression   . PAF  (paroxysmal atrial fibrillation) (HCC)   . Palpitations     Family History  Problem Relation Age of Onset  . Atrial fibrillation Father        pacemaker  . Heart disease Paternal Grandmother        pacemaker    Social History   Socioeconomic History  . Marital status: Married    Spouse name: Not on file  . Number of children: 2  . Years of education: Not on file  . Highest education level: Not on file  Occupational History  . Not on file  Social Needs  . Financial resource strain: Not on file  . Food insecurity:    Worry: Not on file    Inability: Not on file  . Transportation needs:    Medical: Not on file    Non-medical: Not on file  Tobacco Use  . Smoking status: Never Smoker  . Smokeless tobacco: Never Used  Substance and Sexual Activity  . Alcohol use: Not on file  . Drug use: Not on file  . Sexual activity: Not on file  Lifestyle  . Physical activity:    Days per week: Not on file    Minutes per session: Not on file  . Stress: Not on file  Relationships  . Social connections:    Talks on phone: Not on file    Gets together: Not on file    Attends religious service: Not on file    Active member of club or organization: Not on file    Attends meetings of clubs or organizations: Not on file    Relationship status: Not on file  . Intimate partner violence:    Fear of current or ex partner: Not on file    Emotionally abused: Not on file    Physically abused: Not on file    Forced sexual activity: Not on file  Other Topics Concern  . Not on file  Social History Narrative   Lives at home with wife.  Retired Futures traderpolice detective.     Past Medical History, Surgical history, Social history, and Family history were reviewed and updated as appropriate.   W has no immediate plans to retire.  Please see review of systems for further details on the patient's review from today.   Objective:   Physical Exam:  There were no vitals taken for this  visit.  Physical Exam Constitutional:      General: He is not in acute distress.    Appearance: He is well-developed.  Musculoskeletal:        General: No deformity.  Neurological:     Mental Status: He is alert and oriented to person, place, and time.     Motor: No tremor.     Coordination: Coordination normal.     Gait: Gait normal.  Psychiatric:        Attention and Perception: He is attentive.        Mood and Affect: Mood is not anxious or depressed. Affect is not labile, blunt, angry or inappropriate.        Speech: Speech normal.  Behavior: Behavior normal.        Thought Content: Thought content normal. Thought content does not include homicidal or suicidal ideation. Thought content does not include homicidal or suicidal plan.        Judgment: Judgment normal.     Comments: Insight intact. No auditory or visual hallucinations.  Recent intrusive thoughts resolved.      Lab Review:  No results found for: NA, K, CL, CO2, GLUCOSE, BUN, CREATININE, CALCIUM, PROT, ALBUMIN, AST, ALT, ALKPHOS, BILITOT, GFRNONAA, GFRAA  No results found for: WBC, RBC, HGB, HCT, PLT, MCV, MCH, MCHC, RDW, LYMPHSABS, MONOABS, EOSABS, BASOSABS  No results found for: POCLITH, LITHIUM   No results found for: PHENYTOIN, PHENOBARB, VALPROATE, CBMZ  Last lithium level May 13, 2018 on 750 mg lithium per day was 0.7  Lithium December 18, 0.8  .res Assessment: Plan:    Bipolar affective disorder, rapid cycling (HCC)  Generalized anxiety disorder  PTSD (post-traumatic stress disorder)   Medication sensitive.  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  He is medication sensitive and is not typically stable for very long.  Part of the problem is he has a tendency to change medications on his own which complicates treatment.  Fortunately he has not done that since the last appointment.  We reinforced the importance of compliance.  Reviewed the long list of prior  psychiatric medications.  Consider retrying Depakote that was not tried before because of the rapid cycling aspect of his illness.  He has some benefit from the lithium for his depression which is very helpful.  Her lithium level is stable.  He is currently stable as of a couple of months.  Most of current stress is marital and not likely to be fixed with medication.  "If my wife would be nice to me I think I would be fine."   I don't know why we can't have sex.  Rec marital counseling.  Disc how to evaluate the effect of counseling.  No mood swings unrelated to wife.  Problem solving counseling around the marital conflict.  He will initiate marital counseling.  If he wants may consider asking Dr. Quintella ReichertAiken for a second opinion.  Depakote is the obvious alternative.  This appt was 30 mins.  3 mos  Meredith Staggersarey Cottle, MD, DFAPA  Please see After Visit Summary for patient specific instructions.  No future appointments.  No orders of the defined types were placed in this encounter.     -------------------------------

## 2019-01-19 ENCOUNTER — Ambulatory Visit: Payer: Medicare Other | Admitting: Psychiatry

## 2019-02-02 ENCOUNTER — Telehealth: Payer: Self-pay | Admitting: Cardiology

## 2019-02-02 NOTE — Telephone Encounter (Signed)
Nya,  Please help him sign up for mychart.  We are not allowed to get patient information emailed directly.

## 2019-02-02 NOTE — Telephone Encounter (Signed)
Per pt has apple watch and is wanting to email readings to Dr Antoine Poche not sure how to do this as pt states he does not have  My Chart .Will forward to Nya to see if can help pt with this. Per pt had episode off and on for 4 hours today of irreg heart beat./cy

## 2019-02-02 NOTE — Telephone Encounter (Signed)
New Message    Patient c/o Palpitations:  High priority if patient c/o lightheadedness, shortness of breath, or chest pain  1) How long have you had palpitations/irregular HR/ Afib? Are you having the symptoms now? Yes, currently having symptoms  2) Are you currently experiencing lightheadedness, SOB or CP? No   3) Do you have a history of afib (atrial fibrillation) or irregular heart rhythm? Yes  4) Have you checked your BP or HR? (document readings if available): Yes and its Normal   5) Are you experiencing any other symptoms? No, Pt is wondering how to send something from his watch

## 2019-02-03 NOTE — Telephone Encounter (Signed)
appt schedule for pt 05/06 @ 9:20 am

## 2019-02-03 NOTE — Progress Notes (Signed)
Virtual Visit via Video Note   This visit type was conducted due to national recommendations for restrictions regarding the COVID-19 Pandemic (e.g. social distancing) in an effort to limit this patient's exposure and mitigate transmission in our community.  Due to his co-morbid illnesses, this patient is at least at moderate risk for complications without adequate follow up.  This format is felt to be most appropriate for this patient at this time.  All issues noted in this document were discussed and addressed.  A limited physical exam was performed with this format.  Please refer to the patient's chart for his consent to telehealth [facility].   Date:  02/04/2019   ID:  Nathaniel Bush, DOB 12-18-1950, MRN [id number]  Patient Location: Home Provider Location: Home  PCP:  Rodrigo RanPerini, Mark, MD  Cardiologist:  Rollene RotundaJames Adelie Croswell, MD  Electrophysiologist:  None   Evaluation Performed:  Follow-Up Visit  Chief Complaint:  Palpitations  History of Present Illness:    Nathaniel Bush is a 68 y.o. male who presents for resulting for evaluation of palpitations and low BP.  When I saw him last  he had no abnormalities on POET (Plain Old Exercise Treadmill).  He had an excellent exercise tolerance.  He has Apple watch .  He was found to have atrial fib.   Echo was essentially unremarkable.    He called the other day .  He was out riding his bike.  He felt a slight fluttering.  He took his rhythm strip on this and indeed he was in flutter.  Said his heart rate riding the bike never got above his typical which can be as high as 140.  He came home and his heart rate was 100-110 which is unusual for him because he is in the 40s and 50s.  It lasted for about 8 hours and then went away spontaneously.  Heart rates now in the 40s were typically is a 50s when he moves around.  He denies any presyncope or syncope.  He said no chest pressure, neck or arm discomfort.  He feels fine.  As opposed to when he had this  diagnosed and he felt very unusual riding his bike he actually felt fine was able to complete his ride.  He is pretty convinced this is only the first episode since his initial diagnosis.  He otherwise is done well.  Denies any chest pressure, neck or arm discomfort.  He has had no new shortness of breath, PND or orthopnea.  He said no weight gain or edema.  The patient does not have symptoms concerning for COVID-19 infection (fever, chills, cough, or new shortness of breath).    Past Medical History:  Diagnosis Date   Depression    PAF (paroxysmal atrial fibrillation) (HCC)    Palpitations    Past Surgical History:  Procedure Laterality Date   INGUINAL HERNIA REPAIR     TOE SURGERY       Current Meds  Medication Sig   lithium carbonate (ESKALITH) 450 MG CR tablet Take 1 tablet (450 mg total) by mouth daily.   lithium carbonate 300 MG capsule Take 1 capsule (300 mg total) by mouth daily.     Allergies:   Patient has no known allergies.   Social History   Tobacco Use   Smoking status: Never Smoker   Smokeless tobacco: Never Used  Substance Use Topics   Alcohol use: Not on file   Drug use: Not on file     Family  Hx: The patient's family history includes Atrial fibrillation in his father; Heart disease in his paternal grandmother.  ROS:   Please see the history of present illness.    As stated in the HPI and negative for all other systems.   Prior CV studies:   The following studies were reviewed today: None  Labs/Other Tests and Data Reviewed:    EKG:  The patient's Apple Watch cardiac telemetry strip(s) personally reviewed today demonstrate:  PAF with controlled rates  Recent Labs: No results found for requested labs within last 8760 hours.   Recent Lipid Panel No results found for: CHOL, TRIG, HDL, CHOLHDL, LDLCALC, LDLDIRECT  Wt Readings from Last 3 Encounters:  02/04/19 160 lb (72.6 kg)  05/15/18 177 lb 3.2 oz (80.4 kg)  03/21/18 181 lb (82.1  kg)     Objective:    Vital Signs:  Ht 5\' 10"  (1.778 m)    Wt 160 lb (72.6 kg)    BMI 22.96 kg/m    VITAL SIGNS:  reviewed GEN:  no acute distress RESPIRATORY:  normal respiratory effort, symmetric expansion NEURO:  alert and oriented x 3, no obvious focal deficit PSYCH:  normal affect  ASSESSMENT & PLAN:    PAF:   Nathaniel Bush has a CHA2DS2 - VASc score of 1.   He had self limited episode.  At this point he has not had any change in his situation and he is not at higher risk for stroke that he was last year.  Since this was self-limited and only his second episode in 1 year and only any change in therapy or further testing is indicated.  He would let me know if this happens in the future.  We could consider further therapies at that point.    COVID-19 Education: The signs and symptoms of COVID-19 were discussed with the patient and how to seek care for testing (follow up with PCP or arrange E-visit).  The importance of social distancing was discussed today.  Time:   Today, I have spent 16 minutes with the patient with telehealth technology discussing the above problems.     Medication Adjustments/Labs and Tests Ordered: Current medicines are reviewed at length with the patient today.  Concerns regarding medicines are outlined above.   Tests Ordered: No orders of the defined types were placed in this encounter.   Medication Changes: No orders of the defined types were placed in this encounter.   Disposition:  Follow up see me in one year  Signed, Rollene Rotunda, MD  02/04/2019 9:17 AM    [facility]

## 2019-02-04 ENCOUNTER — Telehealth (INDEPENDENT_AMBULATORY_CARE_PROVIDER_SITE_OTHER): Payer: Medicare Other | Admitting: Cardiology

## 2019-02-04 ENCOUNTER — Encounter: Payer: Self-pay | Admitting: Cardiology

## 2019-02-04 VITALS — BP 126/67 | HR 49 | Ht 70.0 in | Wt 160.0 lb

## 2019-02-04 DIAGNOSIS — Z7189 Other specified counseling: Secondary | ICD-10-CM

## 2019-02-04 DIAGNOSIS — I480 Paroxysmal atrial fibrillation: Secondary | ICD-10-CM

## 2019-02-04 NOTE — Patient Instructions (Signed)

## 2019-02-24 ENCOUNTER — Telehealth: Payer: Self-pay | Admitting: Psychiatry

## 2019-02-24 ENCOUNTER — Other Ambulatory Visit: Payer: Self-pay

## 2019-02-24 MED ORDER — ALPRAZOLAM 0.25 MG PO TABS: 0.2500 mg | ORAL_TABLET | Freq: Every day | ORAL | 0 refills | Status: DC | PRN

## 2019-02-24 NOTE — Telephone Encounter (Signed)
Okay #15 alprazolam 0.25 mg 1 daily as needed anxiety

## 2019-02-24 NOTE — Telephone Encounter (Signed)
Need to review chart, alprazolam not listed in meds in epic chart

## 2019-02-24 NOTE — Telephone Encounter (Signed)
Patient need refill on Alazopam to be sent to [facility]

## 2019-02-24 NOTE — Telephone Encounter (Signed)
Looks like last fill for alprazolam 0.25 mg was 08/2017, please advise

## 2019-02-24 NOTE — Telephone Encounter (Signed)
rx called in to [place] 

## 2019-03-04 ENCOUNTER — Other Ambulatory Visit: Payer: Self-pay | Admitting: Psychiatry

## 2019-03-16 ENCOUNTER — Other Ambulatory Visit: Payer: Self-pay

## 2019-03-16 ENCOUNTER — Encounter: Payer: Self-pay | Admitting: Psychiatry

## 2019-03-16 ENCOUNTER — Ambulatory Visit: Payer: Medicare Other | Admitting: Psychiatry

## 2019-03-16 DIAGNOSIS — F319 Bipolar disorder, unspecified: Secondary | ICD-10-CM | POA: Diagnosis not present

## 2019-03-16 DIAGNOSIS — F411 Generalized anxiety disorder: Secondary | ICD-10-CM | POA: Diagnosis not present

## 2019-03-16 DIAGNOSIS — F4310 Post-traumatic stress disorder, unspecified: Secondary | ICD-10-CM | POA: Diagnosis not present

## 2019-03-16 NOTE — Progress Notes (Addendum)
Nathaniel Bush Duhe [id number] 18-Feb-1951 68 y.o.  Subjective:   [name] Grulke is a 68 y.o. (DOB 18-Feb-1951) male.  Chief Complaint:  Chief Complaint  Patient presents with  . Follow-up    Medication Management    HPI   Nathaniel Bush Fullen presents to the office today for follow-up of depression and anxiety.    Last seen in January.  It was relatively stable except for marital stress.  No meds were changed.  Nothing's changed.  Including meds.  Handling stress of socialization and Covid very well.  No unusual issues.. Really slowed down watching the news bc creating stress.  Not taking much Xanax.   No problems with the lithium.   Not depressed but frustrated with marriage. Patient reports stable mood and denies depressed or irritable moods.  Patient denies any recent difficulty with anxiety.  Patient denies difficulty with sleep initiation or maintenance. Denies appetite disturbance.  Patient reports that energy and motivation have been good.  Patient denies any difficulty with concentration.  Patient denies any suicidal ideation.  Going to PCP on the July 7 and will have lithium level.  Still riding bike regularly.  Fastest time this year yesterday.  Past psychiatric medication trials include in [place], Cerefolin NAC, Deplin, Viibryd, mirtazapine, Cytomel, lamotrigine, venlafaxine, buspirone, carbamazepine, Trileptal, risperidone, Abilify, nefazodone, mirtazapine, Viibryd which cause GI pain, trazodone 25 mg which cause manic symptoms, Wellbutrin, duloxetine, Celexa, Lexapro, propranolol Zoloft, Wellbutrin, Trintellix.  I am uncertain if he is taking Depakote before  Review of Systems:  Review of Systems  Neurological: Negative for tremors and weakness.  Psychiatric/Behavioral: Negative for agitation, behavioral problems, confusion, decreased concentration, dysphoric mood, hallucinations, self-injury, sleep disturbance and suicidal ideas. The patient is not nervous/anxious and  is not hyperactive.     Medications: I have reviewed the patient's current medications.  Current Outpatient Medications  Medication Sig Dispense Refill  . ALPRAZolam (XANAX) 0.25 MG tablet Take 1 tablet (0.25 mg total) by mouth daily as needed for anxiety. 15 tablet 0  . diclofenac sodium (VOLTAREN) 1 % GEL     . lithium carbonate (ESKALITH) 450 MG CR tablet TAKE 1 TABLET BY MOUTH  DAILY 90 tablet 0  . lithium carbonate 300 MG capsule TAKE 1 CAPSULE BY MOUTH  DAILY 90 capsule 0  . Multiple Vitamins-Minerals (OCUVITE EXTRA PO) Take by mouth.     No current facility-administered medications for this visit.     Medication Side Effects: None  Allergies: No Known Allergies  Past Medical History:  Diagnosis Date  . Depression   . PAF (paroxysmal atrial fibrillation) (HCC)   . Palpitations     Family History  Problem Relation Age of Onset  . Atrial fibrillation Father        pacemaker  . Heart disease Paternal Grandmother        pacemaker    Social History   Socioeconomic History  . Marital status: Married    Spouse name: Not on file  . Number of children: 2  . Years of education: Not on file  . Highest education level: Not on file  Occupational History  . Not on file  Social Needs  . Financial resource strain: Not on file  . Food insecurity    Worry: Not on file    Inability: Not on file  . Transportation needs    Medical: Not on file    Non-medical: Not on file  Tobacco Use  . Smoking status: Never Smoker  . Smokeless tobacco:  Never Used  Substance and Sexual Activity  . Alcohol use: Not on file  . Drug use: Not on file  . Sexual activity: Not on file  Lifestyle  . Physical activity    Days per week: Not on file    Minutes per session: Not on file  . Stress: Not on file  Relationships  . Social Musicianconnections    Talks on phone: Not on file    Gets together: Not on file    Attends religious service: Not on file    Active member of club or organization: Not  on file    Attends meetings of clubs or organizations: Not on file    Relationship status: Not on file  . Intimate partner violence    Fear of current or ex partner: Not on file    Emotionally abused: Not on file    Physically abused: Not on file    Forced sexual activity: Not on file  Other Topics Concern  . Not on file  Social History Narrative   Lives at home with wife.  Retired Futures traderpolice detective.     Past Medical History, Surgical history, Social history, and Family history were reviewed and updated as appropriate.   W has no immediate plans to retire.  Please see review of systems for further details on the patient's review from today.   Objective:   Physical Exam:  There were no vitals taken for this visit.  Physical Exam Constitutional:      General: He is not in acute distress.    Appearance: He is well-developed.  Musculoskeletal:        General: No deformity.  Neurological:     Mental Status: He is alert and oriented to person, place, and time.     Motor: No tremor.     Coordination: Coordination normal.     Gait: Gait normal.  Psychiatric:        Attention and Perception: He is attentive.        Mood and Affect: Mood is not anxious or depressed. Affect is not labile, blunt, angry or inappropriate.        Speech: Speech normal.        Behavior: Behavior normal.        Thought Content: Thought content normal. Thought content does not include homicidal or suicidal ideation. Thought content does not include homicidal or suicidal plan.        Judgment: Judgment normal.     Comments: Insight intact. No auditory or visual hallucinations.  Recent intrusive thoughts resolved.     Lab Review:  No results found for: NA, K, CL, CO2, GLUCOSE, BUN, CREATININE, CALCIUM, PROT, ALBUMIN, AST, ALT, ALKPHOS, BILITOT, GFRNONAA, GFRAA  No results found for: WBC, RBC, HGB, HCT, PLT, MCV, MCH, MCHC, RDW, LYMPHSABS, MONOABS, EOSABS, BASOSABS  No results found for: POCLITH,  LITHIUM   No results found for: PHENYTOIN, PHENOBARB, VALPROATE, CBMZ  Last lithium level May 13, 2018 on 750 mg lithium per day was 0.7  Lithium December 18, 0.8  .res Assessment: Plan:   Onalee HuaDavid was seen today for follow-up.  Diagnoses and all orders for this visit:  Bipolar affective disorder, rapid cycling (HCC)  Generalized anxiety disorder  PTSD (post-traumatic stress disorder)  Medication sensitive.  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  He is medication sensitive and is not typically stable for very long.  Part of the problem is he has a tendency to change medications on his  own which complicates treatment.  Fortunately he has not done that since the last appointment.  We reinforced the importance of compliance.  Reviewed the long list of prior psychiatric medications.  Consider retrying Depakote that was not tried before because of the rapid cycling aspect of his illness.  He has some benefit from the lithium for his depression which is very helpful.  Her lithium level is stable.  He is currently stable for several months.  Most of current stress is marital and not likely to be fixed with medication.  "If my wife would be nice to me I think I would be fine."   I don't know why we can't have sex.  Rec marital counseling.  Disc how to evaluate the effect of counseling.  No mood swings unrelated to wife.  Problem solving counseling around the marital conflict.  He will initiate marital counseling.  If he wants may consider asking Dr. Barbie Banner for a second opinion.  Depakote is the obvious alternative.  Pending lithium level.  4-6 mos  Lynder Parents, MD, DFAPA  Please see After Visit Summary for patient specific instructions.  No future appointments.  No orders of the defined types were placed in this encounter.  Labs received did not dated April 07, 2019: Normal BMP with creatinine 1.0 calcium 9.6.  Normal CBC and lipid profile.  Normal TSH Vitamin  D 36.9 Testosterone 7.6 with normal 6.6-18.1 Lithium 0.8 No indication for med changes Lynder Parents MD, DFAPA   -------------------------------

## 2019-04-09 ENCOUNTER — Other Ambulatory Visit: Payer: Self-pay | Admitting: Internal Medicine

## 2019-04-09 DIAGNOSIS — E785 Hyperlipidemia, unspecified: Secondary | ICD-10-CM

## 2019-04-21 ENCOUNTER — Other Ambulatory Visit: Payer: Medicare Other

## 2019-05-18 ENCOUNTER — Other Ambulatory Visit: Payer: Self-pay | Admitting: Internal Medicine

## 2019-05-18 DIAGNOSIS — M533 Sacrococcygeal disorders, not elsewhere classified: Secondary | ICD-10-CM

## 2019-05-18 DIAGNOSIS — M5136 Other intervertebral disc degeneration, lumbar region: Secondary | ICD-10-CM

## 2019-06-04 ENCOUNTER — Other Ambulatory Visit: Payer: Self-pay

## 2019-06-04 ENCOUNTER — Ambulatory Visit
Admission: RE | Admit: 2019-06-04 | Discharge: 2019-06-04 | Disposition: A | Discharge status: Home / Self Care | Payer: Medicare Other | Source: Ambulatory Visit | Attending: Internal Medicine | Admitting: Internal Medicine

## 2019-06-04 DIAGNOSIS — M533 Sacrococcygeal disorders, not elsewhere classified: Secondary | ICD-10-CM

## 2019-06-04 DIAGNOSIS — M5136 Other intervertebral disc degeneration, lumbar region: Secondary | ICD-10-CM

## 2019-06-10 ENCOUNTER — Other Ambulatory Visit: Payer: Self-pay | Admitting: Psychiatry

## 2019-06-13 ENCOUNTER — Other Ambulatory Visit: Payer: Medicare Other

## 2019-07-03 ENCOUNTER — Telehealth: Payer: Self-pay | Admitting: *Deleted

## 2019-07-03 NOTE — Telephone Encounter (Signed)
Spoke with patient and he feels fine, does not feel like heart is racing. Blood pressure 123/85 and 120/76. Patient wants to know if he should be taking ASA if so what dose.  Will forward to  Dr Percival Spanish for review Scheduled appointment for 10/6 with Doreene Burke PA

## 2019-07-03 NOTE — Telephone Encounter (Signed)
  Below [employer] received, Left message to call back to discuss how patient is feeling  10 days ago I went to [place] to visit my son (returned yesterday).  The day before I left I noticed that my resting pulse rate had increased from 49 to anywhere from 69 to 90.  I felt fine and had no ill effects and walked an average of 3 to 6 miles daily, but no biking.  I also had intermittent AFIB from time to time,.  Last night resting at bed time was 64.  I rode the bike trainer today and with moderate exertion met pulse went to a steady 128 where it would usually be a steady 95 indoors,.  When I finished I was in AFIB yet again.   What's up with this sudden increase in my pulse rate. No new meds and still just taking 763m of Lithium.  My Lithium tests are always in the normal range and one is due next month again.   What do I need to do before taking the bike out on the road again? Thanks [employer]

## 2019-07-05 NOTE — Telephone Encounter (Signed)
Great question.  Current guidelines in this [place] do suggest ASA although the data are not compelling in support of this.  Until the guidelines change to agree with the [place] guidelines I do suggest a low dose 81 mg ASA daily.

## 2019-07-07 ENCOUNTER — Encounter: Payer: Self-pay | Admitting: Cardiology

## 2019-07-07 ENCOUNTER — Ambulatory Visit (INDEPENDENT_AMBULATORY_CARE_PROVIDER_SITE_OTHER): Payer: Medicare Other | Admitting: Cardiology

## 2019-07-07 ENCOUNTER — Other Ambulatory Visit: Payer: Self-pay

## 2019-07-07 VITALS — BP 124/78 | HR 65 | Temp 97.0°F | Ht 69.0 in | Wt 163.0 lb

## 2019-07-07 DIAGNOSIS — I4892 Unspecified atrial flutter: Secondary | ICD-10-CM | POA: Insufficient documentation

## 2019-07-07 DIAGNOSIS — I483 Typical atrial flutter: Secondary | ICD-10-CM | POA: Diagnosis not present

## 2019-07-07 DIAGNOSIS — R001 Bradycardia, unspecified: Secondary | ICD-10-CM | POA: Diagnosis not present

## 2019-07-07 DIAGNOSIS — Z8659 Personal history of other mental and behavioral disorders: Secondary | ICD-10-CM | POA: Diagnosis not present

## 2019-07-07 LAB — CBC WITH DIFFERENTIAL/PLATELET
Basophils Absolute: 0.1 10*3/uL (ref 0.0–0.2)
Basos: 1 %
EOS (ABSOLUTE): 0.3 10*3/uL (ref 0.0–0.4)
Eos: 4 %
Hematocrit: 41.9 % (ref 37.5–51.0)
Hemoglobin: 14.7 g/dL (ref 13.0–17.7)
Immature Grans (Abs): 0 10*3/uL (ref 0.0–0.1)
Immature Granulocytes: 0 %
Lymphocytes Absolute: 1.6 10*3/uL (ref 0.7–3.1)
Lymphs: 24 %
MCH: 32 pg (ref 26.6–33.0)
MCHC: 35.1 g/dL (ref 31.5–35.7)
MCV: 91 fL (ref 79–97)
Monocytes Absolute: 0.6 10*3/uL (ref 0.1–0.9)
Monocytes: 9 %
Neutrophils Absolute: 4.1 10*3/uL (ref 1.4–7.0)
Neutrophils: 62 %
Platelets: 163 10*3/uL (ref 150–450)
RBC: 4.6 x10E6/uL (ref 4.14–5.80)
RDW: 11.3 % — ABNORMAL LOW (ref 11.6–15.4)
WBC: 6.6 10*3/uL (ref 3.4–10.8)

## 2019-07-07 LAB — COMPREHENSIVE METABOLIC PANEL
ALT: 17 IU/L (ref 0–44)
AST: 20 IU/L (ref 0–40)
Albumin/Globulin Ratio: 2.4 — ABNORMAL HIGH (ref 1.2–2.2)
Albumin: 4.6 g/dL (ref 3.8–4.8)
Alkaline Phosphatase: 54 IU/L (ref 39–117)
BUN/Creatinine Ratio: 16 (ref 10–24)
BUN: 16 mg/dL (ref 8–27)
Bilirubin Total: 1.5 mg/dL — ABNORMAL HIGH (ref 0.0–1.2)
CO2: 24 mmol/L (ref 20–29)
Calcium: 10 mg/dL (ref 8.6–10.2)
Chloride: 102 mmol/L (ref 96–106)
Creatinine, Ser: 1 mg/dL (ref 0.76–1.27)
GFR calc Af Amer: 89 mL/min/{1.73_m2} (ref >59)
GFR calc non Af Amer: 77 mL/min/{1.73_m2} (ref >59)
Globulin, Total: 1.9 g/dL (ref 1.5–4.5)
Glucose: 91 mg/dL (ref 65–99)
Potassium: 4.6 mmol/L (ref 3.5–5.2)
Sodium: 140 mmol/L (ref 134–144)
Total Protein: 6.5 g/dL (ref 6.0–8.5)

## 2019-07-07 LAB — TSH: TSH: 3.17 u[IU]/mL (ref 0.450–4.500)

## 2019-07-07 LAB — MAGNESIUM: Magnesium: 2.4 mg/dL — ABNORMAL HIGH (ref 1.6–2.3)

## 2019-07-07 MED ORDER — APIXABAN 5 MG PO TABS: 5.0000 mg | ORAL_TABLET | Freq: Two times a day (BID) | ORAL | 3 refills | Status: DC

## 2019-07-07 NOTE — Telephone Encounter (Signed)
Patient was seen in office today by Doreene Burke PA who started Eliquis for Atrial Flutter

## 2019-07-07 NOTE — Patient Instructions (Addendum)
Medication Instructions:  STOP Aspirin  START Eliquis Take 1 tablet twice a day  If you need a refill on your cardiac medications before your next appointment, please call your pharmacy.   Lab work: Your physician recommends that you return for lab work in: TODAY-CBC, CMET, MAG, TSH If you have labs (blood work) drawn today and your tests are completely normal, you will receive your results only by: Marland Kitchen MyChart Message (if you have MyChart) OR . A paper copy in the mail If you have any lab test that is abnormal or we need to change your treatment, we will call you to review the results.  Testing/Procedures: NONE   Follow-Up: At [facility], you and your health needs are our priority.  As part of our continuing mission to provide you with exceptional heart care, we have created designated Provider Care Teams.  These Care Teams include your primary Cardiologist (physician) and Advanced Practice Providers (APPs -  Physician Assistants and Nurse Practitioners) who all work together to provide you with the care you need, when you need it. You will need a follow up appointment in 6-8 weeks.  Please call our office 2 months in advance to schedule this appointment.  You may see Minus Breeding, MD or one of the following Advanced Practice Providers on your designated Care Team:   Rosaria Ferries, PA-C . Jory Sims, DNP, ANP  Any Other Special Instructions Will Be Listed Below (If Applicable). You have been referred to AFIB CLINIC-STAT

## 2019-07-07 NOTE — Assessment & Plan Note (Signed)
Recent onset

## 2019-07-07 NOTE — Assessment & Plan Note (Signed)
H/O depression and OCD- on Lithium, he reports stable drug levels

## 2019-07-07 NOTE — Progress Notes (Signed)
Cardiology Office Note:    Date:  07/07/2019   ID:  Nathaniel Bush, DOB 07-May-1951, MRN [id number]  PCP:  Crist Infante, MD  Cardiologist:  Minus Breeding, MD  Electrophysiologist:  None   Referring MD: Crist Infante, MD   Palpitations-   History of Present Illness:    Nathaniel Bush is a 68 y.o. male with a hx of palpitations, presumed PAF, and obsessive-compulsive disorder and depression on chronic lithium.  He has had bradycardia in the past which is been a concern to the patient and his wife although it is not clear that he has been symptomatic with this.  He has had palpitations and is had what we think is PAF by his monitor watch.  He is a Sports administrator DS VASC 1 and has not been on anticoagulation.  Holter monitor in July 2019 showed no arrhythmia or significant bradycardia.  Treadmill test in May 2019 was negative.  Echocardiogram in July 2019 showed normal LV function with normal left atrial size.  The patient is in the office today accompanied by his wife who is an Therapist, sports.  He was recently out of town and noted erratic heart rate readings on his watch.  He says at times it is in the 40s and other times up to 120 with exercise which is high for him.  He denies any syncope.  In the past he has had near syncope when he stands up too quickly, but not recently.  His EKG in the office today shows atrial flutter with a ventricular response of 65.    Past Medical History:  Diagnosis Date  . Depression   . PAF (paroxysmal atrial fibrillation) ([place])   . Palpitations     Past Surgical History:  Procedure Laterality Date  . INGUINAL HERNIA REPAIR    . TOE SURGERY      Current Medications: Current Meds  Medication Sig  . ALPRAZolam (XANAX) 0.25 MG tablet Take 1 tablet (0.25 mg total) by mouth daily as needed for anxiety.  . diclofenac sodium (VOLTAREN) 1 % GEL   . lithium carbonate (ESKALITH) 450 MG CR tablet TAKE 1 TABLET BY MOUTH  DAILY  . lithium carbonate 300 MG capsule TAKE 1 CAPSULE BY  MOUTH  DAILY  . Multiple Vitamins-Minerals (OCUVITE EXTRA PO) Take by mouth.     Allergies:   Patient has no known allergies.   Social History   Socioeconomic History  . Marital status: Married    Spouse name: Not on file  . Number of children: 2  . Years of education: Not on file  . Highest education level: Not on file  Occupational History  . Not on file  Social Needs  . Financial resource strain: Not on file  . Food insecurity    Worry: Not on file    Inability: Not on file  . Transportation needs    Medical: Not on file    Non-medical: Not on file  Tobacco Use  . Smoking status: Never Smoker  . Smokeless tobacco: Never Used  Substance and Sexual Activity  . Alcohol use: Not on file  . Drug use: Not on file  . Sexual activity: Not on file  Lifestyle  . Physical activity    Days per week: Not on file    Minutes per session: Not on file  . Stress: Not on file  Relationships  . Social Herbalist on phone: Not on file    Gets together: Not on file  Attends religious service: Not on file    Active member of club or organization: Not on file    Attends meetings of clubs or organizations: Not on file    Relationship status: Not on file  Other Topics Concern  . Not on file  Social History Narrative   Lives at home with wife.  Retired Futures traderpolice detective.      Family History: The patient's family history includes Atrial fibrillation in his father; Heart disease in his paternal grandmother. His father and brother had pacemakers placed.  ROS:   Please see the history of present illness.  No history of fever, chills, OTC medication use or GI bleeding.    All other systems reviewed and are negative.  EKGs/Labs/Other Studies Reviewed:    The following studies were reviewed today: Echo July 2019 POET [id number]  EKG:  EKG is ordered today.  The ekg ordered today demonstrates Atrial flutter with VR 65  Recent Labs: No results found for requested labs within  last 8760 hours.  Recent Lipid Panel No results found for: CHOL, TRIG, HDL, CHOLHDL, VLDL, LDLCALC, LDLDIRECT  Physical Exam:    VS:  BP 124/78   Pulse 65   Temp (!) 97 F (36.1 C)   Ht 5\' 9"  (1.753 m)   Wt 163 lb (73.9 kg)   BMI 24.07 kg/m     Wt Readings from Last 3 Encounters:  07/07/19 163 lb (73.9 kg)  02/04/19 160 lb (72.6 kg)  05/15/18 177 lb 3.2 oz (80.4 kg)     GEN:  Well nourished, well developed in no acute distress HEENT: Normal NECK: No JVD; No carotid bruits LYMPHATICS: No lymphadenopathy CARDIAC: regularly irregular rhythm, no murmurs, rubs, gallops RESPIRATORY:  Clear to auscultation without rales, wheezing or rhonchi  ABDOMEN: Soft, non-tender, non-distended MUSCULOSKELETAL:  No edema; No deformity  SKIN: Warm and dry NEUROLOGIC:  Alert and oriented x 3 PSYCHIATRIC:  Normal affect   ASSESSMENT:    Atrial flutter (HCC) Recent onset  Bradycardia H/O chronic bradycardia, not clearly symptomatic, the patient is very athletic and an avid exerciser and bicyclists.   History of depression H/O depression and OCD- on Lithium, he reports stable drug levels  PLAN:    Refer to AF clinic- ? Ablation candidate. Start Eliquis 5 mg BID, stop ASA.  Check labs-CBC, CMET, Mg++, TSH.    Medication Adjustments/Labs and Tests Ordered: Current medicines are reviewed at length with the patient today.  Concerns regarding medicines are outlined above.  Orders Placed This Encounter  Procedures  . CBC with Differential  . TSH  . Comprehensive Metabolic Panel (CMET)  . Magnesium  . Amb Referral to AFIB Clinic   Meds ordered this encounter  Medications  . apixaban (ELIQUIS) 5 MG TABS tablet    Sig: Take 1 tablet (5 mg total) by mouth 2 (two) times daily.    Dispense:  60 tablet    Refill:  3    Patient Instructions  Medication Instructions:  STOP Aspirin  START Eliquis Take 1 tablet twice a day  If you need a refill on your cardiac medications before your next  appointment, please call your pharmacy.   Lab work: Your physician recommends that you return for lab work in: TODAY-CBC, CMET, MAG, TSH If you have labs (blood work) drawn today and your tests are completely normal, you will receive your results only by: Marland Kitchen. MyChart Message (if you have MyChart) OR . A paper copy in the mail If you have any  lab test that is abnormal or we need to change your treatment, we will call you to review the results.  Testing/Procedures: NONE   Follow-Up: At [facility], you and your health needs are our priority.  As part of our continuing mission to provide you with exceptional heart care, we have created designated Provider Care Teams.  These Care Teams include your primary Cardiologist (physician) and Advanced Practice Providers (APPs -  Physician Assistants and Nurse Practitioners) who all work together to provide you with the care you need, when you need it. You will need a follow up appointment in 6-8 weeks.  Please call our office 2 months in advance to schedule this appointment.  You may see Rollene Rotunda, MD or one of the following Advanced Practice Providers on your designated Care Team:   Theodore Demark, PA-C . Joni Reining, DNP, ANP  Any Other Special Instructions Will Be Listed Below (If Applicable). You have been referred to AFIB CLINIC-STAT      Signed, Corine Shelter, PA-C  07/07/2019 11:03 AM    [facility]

## 2019-07-07 NOTE — Assessment & Plan Note (Signed)
H/O chronic bradycardia, not clearly symptomatic, the patient is very athletic and an avid exerciser and bicyclists.

## 2019-07-08 NOTE — Addendum Note (Signed)
Addended by: Therisa Doyne on: 07/08/2019 12:04 PM   Modules accepted: Orders

## 2019-07-09 ENCOUNTER — Encounter (HOSPITAL_COMMUNITY): Payer: Self-pay | Admitting: Physician Assistant

## 2019-07-09 ENCOUNTER — Other Ambulatory Visit: Payer: Self-pay

## 2019-07-09 ENCOUNTER — Ambulatory Visit (HOSPITAL_COMMUNITY)
Admission: RE | Admit: 2019-07-09 | Discharge: 2019-07-09 | Disposition: A | Discharge status: Home / Self Care | Payer: Medicare Other | Source: Ambulatory Visit | Attending: Nurse Practitioner | Admitting: Nurse Practitioner

## 2019-07-09 VITALS — BP 146/74 | HR 80 | Ht 69.0 in | Wt 161.4 lb

## 2019-07-09 DIAGNOSIS — Z79899 Other long term (current) drug therapy: Secondary | ICD-10-CM | POA: Insufficient documentation

## 2019-07-09 DIAGNOSIS — F329 Major depressive disorder, single episode, unspecified: Secondary | ICD-10-CM | POA: Insufficient documentation

## 2019-07-09 DIAGNOSIS — I480 Paroxysmal atrial fibrillation: Secondary | ICD-10-CM | POA: Insufficient documentation

## 2019-07-09 DIAGNOSIS — Z7901 Long term (current) use of anticoagulants: Secondary | ICD-10-CM | POA: Diagnosis not present

## 2019-07-09 DIAGNOSIS — I483 Typical atrial flutter: Secondary | ICD-10-CM | POA: Diagnosis present

## 2019-07-09 NOTE — Progress Notes (Signed)
Primary Care Physician: Crist Infante, MD Primary Cardiologist: Dr Percival Spanish Primary Electrophysiologist: none Referring Physician: Kerin Ransom PA-C   Nathaniel Bush is a 68 y.o. male with a history of OCD and depression on chronic lithium and atrial flutter who presents for consultation in the [facility]. Patient seen by Kerin Ransom on 07/07/19 and found to have rate controlled, typical atrial flutter. He has had palpitations in the past and cardiac monitoring showed no arrhythmias. 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 Echocardiogram in July 2019 showed normal LV function with normal left atrial size. Patient denies significant snoring but does admit to alcohol use. He does have symptoms of heart racing at times and exercise intolerance.   Today, he denies symptoms of chest pain, shortness of breath, orthopnea, PND, lower extremity edema, dizziness, presyncope, syncope, snoring, daytime somnolence, bleeding, or neurologic sequela. The patient is tolerating medications without difficulties and is otherwise without complaint today.    Atrial Fibrillation Risk Factors:  he does not have symptoms or diagnosis of sleep apnea. he does not have a history of rheumatic fever. he does have a history of alcohol use. The patient does have a history of early familial atrial fibrillation or other arrhythmias. Father and grandfather had PPM.  he has a BMI of Body mass index is 23.83 kg/m.Marland Kitchen Filed Weights   07/09/19 1337  Weight: 73.2 kg    Family History  Problem Relation Age of Onset  . Atrial fibrillation Father        pacemaker  . Heart disease Paternal Grandmother        pacemaker     Atrial Fibrillation Management history:  Previous antiarrhythmic drugs: none Previous cardioversions: none Previous ablations: none  CHADS2VASC score: 1 Anticoagulation history: Eliquis   Past Medical History:  Diagnosis Date  . Depression   . PAF (paroxysmal atrial fibrillation) ([place])   . Palpitations    Past Surgical History:  Procedure Laterality Date  . INGUINAL HERNIA REPAIR    . TOE SURGERY      Current Outpatient Medications  Medication Sig Dispense Refill  . ALPRAZolam (XANAX) 0.25 MG tablet Take 1 tablet (0.25 mg total) by mouth daily as needed for anxiety. 15 tablet 0  . apixaban (ELIQUIS) 5 MG TABS tablet Take 1 tablet (5 mg total) by mouth 2 (two) times daily. 60 tablet 3  . diclofenac sodium (VOLTAREN) 1 % GEL     . lithium carbonate (ESKALITH) 450 MG CR tablet TAKE 1 TABLET BY MOUTH  DAILY 90 tablet 0  . lithium carbonate 300 MG capsule TAKE 1 CAPSULE BY MOUTH  DAILY 90 capsule 0  . Multiple Vitamins-Minerals (OCUVITE EXTRA PO) Take by mouth.     No current facility-administered medications for this encounter.     No Known Allergies  Social History   Socioeconomic History  . Marital status: Married    Spouse name: Not on file  . Number of children: 2  . Years of education: Not on file  . Highest education level: Not on file  Occupational History  . Not on file  Social Needs  . Financial resource strain: Not on file  . Food insecurity    Worry: Not on file    Inability: Not on file  . Transportation needs    Medical: Not on file  Non-medical: Not on file  Tobacco Use  . Smoking status: Never Smoker  . Smokeless tobacco: Never Used  Substance and Sexual Activity  . Alcohol use: Not on file  . Drug use: Not on file  . Sexual activity: Not on file  Lifestyle  . Physical activity    Days per week: Not on file    Minutes per session: Not on file  . Stress: Not on file  Relationships  . Social Musicianconnections    Talks on phone: Not on file    Gets together: Not on file    Attends religious service: Not on file    Active member of club or organization: Not on file    Attends  meetings of clubs or organizations: Not on file    Relationship status: Not on file  . Intimate partner violence    Fear of current or ex partner: Not on file    Emotionally abused: Not on file    Physically abused: Not on file    Forced sexual activity: Not on file  Other Topics Concern  . Not on file  Social History Narrative   Lives at home with wife.  Retired Futures traderpolice detective.      ROS- All systems are reviewed and negative except as per the HPI above.  Physical Exam: Vitals:   07/09/19 1337  BP: (!) 146/74  Pulse: 80  Weight: 73.2 kg  Height: 5\' 9"  (1.753 m)    GEN- The patient is well appearing, alert and oriented x 3 today.   Head- normocephalic, atraumatic Eyes-  Sclera clear, conjunctiva pink Ears- hearing intact Oropharynx- clear Neck- supple  Lungs- Clear to ausculation bilaterally, normal work of breathing Heart- irregular rate and rhythm, no murmurs, rubs or gallops  GI- soft, NT, ND, + BS Extremities- no clubbing, cyanosis, or edema MS- no significant deformity or atrophy Skin- no rash or lesion Psych- euthymic mood, full affect Neuro- strength and sensation are intact  Wt Readings from Last 3 Encounters:  07/09/19 73.2 kg  07/07/19 73.9 kg  02/04/19 72.6 kg    EKG today demonstrates typical atrial flutter with variable block HR 80, QRS 90, QTc 424  Echo 04/29/18 demonstrated  - Left ventricle: The cavity size was normal. Systolic function was   normal. The estimated ejection fraction was in the range of 55%   to 60%. Wall motion was normal; there were no regional wall   motion abnormalities. Left ventricular diastolic function   parameters were normal. - Aortic valve: There was trivial regurgitation. - Mitral valve: There was mild regurgitation. - Right ventricle: The cavity size was mildly dilated. Wall   thickness was normal. - Tricuspid valve: There was mild regurgitation. - Pulmonic valve: There was mild regurgitation.  Impressions:   - Normal LVF with mild MR, mild TR and mild PR. Trivial AR. Mildly   dilated RV. PASP 27mmHg.  Epic records are reviewed at length today  Assessment and Plan:  1. Typical atrial flutter/? Paroxysmal afib General education about atrial flutter discussed and questions answered.  We also discussed his stroke risk and the risks and benefits of anticoagulation.  We discussed therapeutic options including ablation. After discussing the risk and benefits he would like to be referred for ablation. Continue Eliquis 5 mg BID Patient noted afib on Apple Watch but difficult to tell if true afib or atrial flutter with variable block. Lifestyle modification was discussed and encouraged including regular physical activity and alcohol reduction.   This patients  CHA2DS2-VASc Score and unadjusted Ischemic Stroke Rate (% per year) is equal to 0.6 % stroke rate/year from a score of 1  Above score calculated as 1 point each if present [CHF, HTN, DM, Vascular=MI/PAD/Aortic Plaque, Age if 65-74, or Male] Above score calculated as 2 points each if present [Age > 75, or Stroke/TIA/TE]   Follow up with Dr Elberta Fortis to consider ablation.    Jorja Loa PA-C [facility] [address] [place], [place] [address] [phone] 07/09/2019 2:11 PM

## 2019-07-21 ENCOUNTER — Other Ambulatory Visit: Payer: Self-pay

## 2019-07-21 ENCOUNTER — Encounter: Payer: Self-pay | Admitting: Cardiology

## 2019-07-21 ENCOUNTER — Ambulatory Visit: Payer: Medicare Other | Admitting: Cardiology

## 2019-07-21 VITALS — BP 108/78 | HR 69 | Ht 69.0 in | Wt 161.4 lb

## 2019-07-21 DIAGNOSIS — I483 Typical atrial flutter: Secondary | ICD-10-CM

## 2019-07-21 DIAGNOSIS — Z01812 Encounter for preprocedural laboratory examination: Secondary | ICD-10-CM

## 2019-07-21 LAB — BASIC METABOLIC PANEL
BUN/Creatinine Ratio: 14 (ref 10–24)
BUN: 15 mg/dL (ref 8–27)
CO2: 24 mmol/L (ref 20–29)
Calcium: 9.9 mg/dL (ref 8.6–10.2)
Chloride: 103 mmol/L (ref 96–106)
Creatinine, Ser: 1.04 mg/dL (ref 0.76–1.27)
GFR calc Af Amer: 85 mL/min/{1.73_m2} (ref >59)
GFR calc non Af Amer: 73 mL/min/{1.73_m2} (ref >59)
Glucose: 101 mg/dL — ABNORMAL HIGH (ref 65–99)
Potassium: 5.2 mmol/L (ref 3.5–5.2)
Sodium: 140 mmol/L (ref 134–144)

## 2019-07-21 LAB — CBC
Hematocrit: 42 % (ref 37.5–51.0)
Hemoglobin: 14.7 g/dL (ref 13.0–17.7)
MCH: 31.4 pg (ref 26.6–33.0)
MCHC: 35 g/dL (ref 31.5–35.7)
MCV: 90 fL (ref 79–97)
Platelets: 189 10*3/uL (ref 150–450)
RBC: 4.68 x10E6/uL (ref 4.14–5.80)
RDW: 11.8 % (ref 11.6–15.4)
WBC: 7.2 10*3/uL (ref 3.4–10.8)

## 2019-07-21 NOTE — Patient Instructions (Addendum)
Medication Instructions:  Your physician recommends that you continue on your current medications as directed. Please refer to the Current Medication list given to you today.  Labwork: Return for pre procedure lab work today:  for [employer] & CBC If you have labs (blood work) drawn today and your tests are completely normal, you will receive your results only by:  [place] (if you have MyChart) OR  A paper copy in the mail If you have any lab test that is abnormal or we need to change your treatment, we will call you to review the results.  Testing/Procedures: Your physician has recommended that you have an ablation. Catheter ablation is a medical procedure used to treat some cardiac arrhythmias (irregular heartbeats). During catheter ablation, a long, thin, flexible tube is put into a blood vessel in your groin (upper thigh), or neck. This tube is called an ablation catheter. It is then guided to your heart through the blood vessel. Radio frequency waves destroy small areas of heart tissue where abnormal heartbeats may cause an arrhythmia to start.   Follow-Up: You are scheduled for post procedure follow up, with Dr. Curt Bears, on 09/17/2019 @ 8:45 am  * If you need a refill on your cardiac medications before your next appointment, please call your pharmacy.   Thank you for choosing [facility]!!   Trinidad Curet, RN [phone]  Any Other Special Instructions Will Be Listed Below (If Applicable).    Electrophysiology/Ablation Procedure Instructions   You are scheduled for a(n) AFLutter ablation on 08/14/2019 with Dr. Allegra Lai.   1.   Pre procedure testing-             A.  LAB WORK --- On 07/21/19  for your pre procedure blood work.                 B. COVID TEST-- On 08/11/19 @ 2:00 pm - You will go to [facility] for your Covid testing.   This is a drive thru test site.  There will be multiple testing areas.  Be sure to share with  the first checkpoint that you are there for pre-procedure/surgery testing. This will put you into the right (yellow) lane that leads to the PAT testing team. Stay in your car and the nurse team will come to your car to test you.  After you are tested please go home and self quarantine until the day of your procedure.     2. On the day of your procedure 08/14/19 you will go to [facility] [phone]) at 10:30 am.  Dennis Bast will go to the main entrance A [employer]) and enter where the [employer] are.  Your driver will drop you off and you will head down the hallway to ADMITTING.  You may have one support person come in to the hospital with you.  They will be asked to wait in the waiting room.   3.   Do not eat or drink after midnight prior to your procedure.   4.   Do NOT take any medications the morning of your procedure.   5.  Plan for an overnight stay.  If you use your phone frequently bring your phone charger.   * If you have ANY questions please call the office [phone] and ask for Jerran Tappan RN or send me a MyChart message   * Occasionally, EP Studies and ablations can become lengthy.  Please make your  family aware of this before your procedure starts.  Average time ranges from 2-8 hours for EP studies/ablations.  Your physician will call your family after the procedure with the results.                                     Cardiac Ablation Cardiac ablation is a procedure to disable (ablate) a small amount of heart tissue in very specific places. The heart has many electrical connections. Sometimes these connections are abnormal and can cause the heart to beat very fast or irregularly. Ablating some of the problem areas can improve the heart rhythm or return it to normal. Ablation may be done for people who:  Have Wolff-Parkinson-White syndrome.  Have fast heart rhythms (tachycardia).  Have taken medicines for an abnormal heart rhythm (arrhythmia) that were not  effective or caused side effects.  Have a high-risk heartbeat that may be life-threatening.  During the procedure, a small incision is made in the neck or the groin, and a long, thin, flexible tube (catheter) is inserted into the incision and moved to the heart. Small devices (electrodes) on the tip of the catheter will send out electrical currents. A type of X-ray (fluoroscopy) will be used to help guide the catheter and to provide images of the heart. Tell a health care provider about:  Any allergies you have.  All medicines you are taking, including vitamins, herbs, eye drops, creams, and over-the-counter medicines.  Any problems you or family members have had with anesthetic medicines.  Any blood disorders you have.  Any surgeries you have had.  Any medical conditions you have, such as kidney failure.  Whether you are pregnant or may be pregnant. What are the risks? Generally, this is a safe procedure. However, problems may occur, including:  Infection.  Bruising and bleeding at the catheter insertion site.  Bleeding into the chest, especially into the sac that surrounds the heart. This is a serious complication.  Stroke or blood clots.  Damage to other structures or organs.  Allergic reaction to medicines or dyes.  Need for a permanent pacemaker if the normal electrical system is damaged. A pacemaker is a small computer that sends electrical signals to the heart and helps your heart beat normally.  The procedure not being fully effective. This may not be recognized until months later. Repeat ablation procedures are sometimes required.  What happens before the procedure?  Follow instructions from your health care provider about eating or drinking restrictions.  Ask your health care provider about: ? Changing or stopping your regular medicines. This is especially important if you are taking diabetes medicines or blood thinners. ? Taking medicines such as aspirin and  ibuprofen. These medicines can thin your blood. Do not take these medicines before your procedure if your health care provider instructs you not to.  Plan to have someone take you home from the hospital or clinic.  If you will be going home right after the procedure, plan to have someone with you for 24 hours. What happens during the procedure?  To lower your risk of infection: ? Your health care team will wash or sanitize their hands. ? Your skin will be washed with soap. ? Hair may be removed from the incision area.  An IV tube will be inserted into one of your veins.  You will be given a medicine to help you relax (sedative).  The skin on  your neck or groin will be numbed.  An incision will be made in your neck or your groin.  A needle will be inserted through the incision and into a large vein in your neck or groin.  A catheter will be inserted into the needle and moved to your heart.  Dye may be injected through the catheter to help your surgeon see the area of the heart that needs treatment.  Electrical currents will be sent from the catheter to ablate heart tissue in desired areas. There are three types of energy that may be used to ablate heart tissue: ? Heat (radiofrequency energy). ? Laser energy. ? Extreme cold (cryoablation).  When the necessary tissue has been ablated, the catheter will be removed.  Pressure will be held on the catheter insertion area to prevent excessive bleeding.  A bandage (dressing) will be placed over the catheter insertion area. The procedure may vary among health care providers and hospitals. What happens after the procedure?  Your blood pressure, heart rate, breathing rate, and blood oxygen level will be monitored until the medicines you were given have worn off.  Your catheter insertion area will be monitored for bleeding. You will need to lie still for a few hours to ensure that you do not bleed from the catheter insertion area.  Do  not drive for 5-7 days or as long as directed by your health care provider. Summary  Cardiac ablation is a procedure to disable (ablate) a small amount of heart tissue in very specific places. Ablating some of the problem areas can improve the heart rhythm or return it to normal.  During the procedure, electrical currents will be sent from the catheter to ablate heart tissue in desired areas. This information is not intended to replace advice given to you by your health care provider. Make sure you discuss any questions you have with your health care provider. Document Released: 02/03/2009 Document Revised: 08/06/2016 Document Reviewed: 08/06/2016 Elsevier Interactive Patient Education  [employer]2018 [employer]

## 2019-07-21 NOTE — Progress Notes (Signed)
Electrophysiology Office Note   Date:  07/21/2019   ID:  Castin, Donaghue 11-Apr-1951, MRN [id number]  PCP:  Rodrigo Ran, MD  Cardiologist:  Hochrein Primary Electrophysiologist:  Dorrell Mitcheltree Jorja Loa, MD    Chief Complaint: AF   History of Present Illness: Nathaniel Bush is a 68 y.o. male who is being seen today for the evaluation of AF at the request of Jorja Loa. Presenting today for electrophysiology evaluation.  He has a history significant for OCD, depression, on chronic lithium, and atrial flutter.  He was found to have rate controlled atrial flutter in cardiology clinic.  He wore an apple watch which potentially showed atrial fibrillation.  He states that when he exercises at times his heart rate gets into the 200s.  He is otherwise unaware of atrial fibrillation or flutter at rest.  He states that he does get a little bit more short of breath with exercise.  Today, he denies symptoms of palpitations, chest pain, shortness of breath, orthopnea, PND, lower extremity edema, claudication, dizziness, presyncope, syncope, bleeding, or neurologic sequela. The patient is tolerating medications without difficulties.    Past Medical History:  Diagnosis Date  . Depression   . PAF (paroxysmal atrial fibrillation) (HCC)   . Palpitations    Past Surgical History:  Procedure Laterality Date  . INGUINAL HERNIA REPAIR    . TOE SURGERY       Current Outpatient Medications  Medication Sig Dispense Refill  . ALPRAZolam (XANAX) 0.25 MG tablet Take 1 tablet (0.25 mg total) by mouth daily as needed for anxiety. 15 tablet 0  . apixaban (ELIQUIS) 5 MG TABS tablet Take 1 tablet (5 mg total) by mouth 2 (two) times daily. 60 tablet 3  . diclofenac sodium (VOLTAREN) 1 % GEL     . lithium carbonate (ESKALITH) 450 MG CR tablet TAKE 1 TABLET BY MOUTH  DAILY 90 tablet 0  . lithium carbonate 300 MG capsule TAKE 1 CAPSULE BY MOUTH  DAILY 90 capsule 0  . Multiple Vitamins-Minerals (OCUVITE  EXTRA PO) Take by mouth.     No current facility-administered medications for this visit.     Allergies:   Patient has no known allergies.   Social History:  The patient  reports that he has never smoked. He has never used smokeless tobacco. He reports current alcohol use of about 3.0 standard drinks of alcohol per week. He reports that he does not use drugs.   Family History:  The patient's family history includes Atrial fibrillation in his father; Heart disease in his paternal grandmother.    ROS:  Please see the history of present illness.   Otherwise, review of systems is positive for none.   All other systems are reviewed and negative.    PHYSICAL EXAM: VS:  BP 108/78   Pulse 69   Ht 5\' 9"  (1.753 m)   Wt 161 lb 6.4 oz (73.2 kg)   SpO2 98%   BMI 23.83 kg/m  , BMI Body mass index is 23.83 kg/m. GEN: Well nourished, well developed, in no acute distress  HEENT: normal  Neck: no JVD, carotid bruits, or masses Cardiac: Irregular; no murmurs, rubs, or gallops,no edema  Respiratory:  clear to auscultation bilaterally, normal work of breathing GI: soft, nontender, nondistended, + BS MS: no deformity or atrophy  Skin: warm and dry Neuro:  Strength and sensation are intact Psych: euthymic mood, full affect  EKG:  EKG is not ordered today. Personal review of the ekg ordered 07/09/19  shows atrial flutter  Recent Labs: 07/07/2019: ALT 17; BUN 16; Creatinine, Ser 1.00; Hemoglobin 14.7; Magnesium 2.4; Platelets 163; Potassium 4.6; Sodium 140; TSH 3.170    Lipid Panel  No results found for: CHOL, TRIG, HDL, CHOLHDL, VLDL, LDLCALC, LDLDIRECT   Wt Readings from Last 3 Encounters:  07/21/19 161 lb 6.4 oz (73.2 kg)  07/09/19 161 lb 6.4 oz (73.2 kg)  07/07/19 163 lb (73.9 kg)      Other studies Reviewed: Additional studies/ records that were reviewed today include: TTE 04/29/18  Review of the above records today demonstrates:  - Left ventricle: The cavity size was normal. Systolic  function was   normal. The estimated ejection fraction was in the range of 55%   to 60%. Wall motion was normal; there were no regional wall   motion abnormalities. Left ventricular diastolic function   parameters were normal. - Aortic valve: There was trivial regurgitation. - Mitral valve: There was mild regurgitation. - Right ventricle: The cavity size was mildly dilated. Wall   thickness was normal. - Tricuspid valve: There was mild regurgitation. - Pulmonic valve: There was mild regurgitation.- Left ventricle: The cavity size was normal. Systolic function was   normal. The estimated ejection fraction was in the range of 55%   to 60%. Wall motion was normal; there were no regional wall   motion abnormalities. Left ventricular diastolic function   parameters were normal. - Aortic valve: There was trivial regurgitation. - Mitral valve: There was mild regurgitation. - Right ventricle: The cavity size was mildly dilated. Wall   thickness was normal. - Tricuspid valve: There was mild regurgitation. - Pulmonic valve: There was mild regurgitation.  Holter 04/10/18 personally reviewed. NSR,  Rare ventricular ectopy. Atrial ectopy with PACs and bigeminy Rare brief runs of PAT.  No sustained arrhythmias   ASSESSMENT AND PLAN:  1.  Typical atrial flutter: Currently on Eliquis.    At this point, he would prefer to be off of Eliquis and would prefer ablation.  He brings in apple watch recordings that appear to be due to atrial fibrillation, but and correlating his recordings to his ECG today, I feel that these are all due to atrial flutter.  Risks of ablation include bleeding, tamponade, heart block, stroke, damage surrounding organs among others.  He understands these risks and has agreed to the procedure.  We Marrion Finan continue Eliquis around the time of ablation but Teigan Sahli likely be able to stop it post procedure.  This patients CHA2DS2-VASc Score and unadjusted Ischemic Stroke Rate (% per year)  is equal to 0.6 % stroke rate/year from a score of 1  Above score calculated as 1 point each if present [CHF, HTN, DM, Vascular=MI/PAD/Aortic Plaque, Age if 65-74, or Male] Above score calculated as 2 points each if present [Age > 75, or Stroke/TIA/TE]   Current medicines are reviewed at length with the patient today.   The patient does not have concerns regarding his medicines.  The following changes were made today:  none  Labs/ tests ordered today include:  Orders Placed This Encounter  Procedures  . Basic metabolic panel  . CBC  . EKG 12-Lead   Case discussed with referring cardiologist  Disposition:   FU with Yasha Tibbett 3 months  Signed, Marcial Pless Meredith Leeds, MD  07/21/2019 9:51 AM     [facility] [address] [place] [address] [phone] (office) [phone] (fax)

## 2019-08-10 ENCOUNTER — Telehealth: Payer: Self-pay | Admitting: *Deleted

## 2019-08-10 NOTE — Telephone Encounter (Signed)
Pt made aware of scheduling changes at hospital this Friday.  Pt made aware to arrive at 6:00 am Friday for his procedure at 8am. Patient verbalized understanding and agreeable to plan.

## 2019-08-11 ENCOUNTER — Other Ambulatory Visit (HOSPITAL_COMMUNITY)
Admission: RE | Admit: 2019-08-11 | Discharge: 2019-08-11 | Disposition: A | Discharge status: Home / Self Care | Payer: Medicare Other | Source: Ambulatory Visit | Attending: Cardiology | Admitting: Cardiology

## 2019-08-11 DIAGNOSIS — Z20828 Contact with and (suspected) exposure to other viral communicable diseases: Secondary | ICD-10-CM | POA: Insufficient documentation

## 2019-08-11 DIAGNOSIS — Z01812 Encounter for preprocedural laboratory examination: Secondary | ICD-10-CM | POA: Diagnosis present

## 2019-08-13 LAB — NOVEL CORONAVIRUS, NAA (HOSP ORDER, SEND-OUT TO REF LAB; TAT 18-24 HRS): SARS-CoV-2, NAA: NOT DETECTED

## 2019-08-13 NOTE — Anesthesia Preprocedure Evaluation (Addendum)
Anesthesia Evaluation  Patient identified by MRN, date of birth, ID band Patient awake    Reviewed: Allergy & Precautions, NPO status , Patient's Chart, lab work & pertinent test results  History of Anesthesia Complications Negative for: history of anesthetic complications  Airway Mallampati: II  TM Distance: >3 FB Neck ROM: Full    Dental no notable dental hx. (+) Dental Advisory Given, Teeth Intact,    Pulmonary neg pulmonary ROS,    Pulmonary exam normal        Cardiovascular + dysrhythmias Atrial Fibrillation  Rhythm:Regular Rate:Normal     Neuro/Psych PSYCHIATRIC DISORDERS Depression negative neurological ROS     GI/Hepatic negative GI ROS, Neg liver ROS,   Endo/Other  negative endocrine ROS  Renal/GU negative Renal ROS     Musculoskeletal negative musculoskeletal ROS (+)   Abdominal   Peds  Hematology negative hematology ROS (+)   Anesthesia Other Findings Day of surgery medications reviewed with the patient.  Reproductive/Obstetrics                           Anesthesia Physical Anesthesia Plan  ASA: II  Anesthesia Plan: General   Post-op Pain Management:    Induction: Intravenous  PONV Risk Score and Plan: Ondansetron and Dexamethasone  Airway Management Planned: Oral ETT  Additional Equipment:   Intra-op Plan:   Post-operative Plan: Extubation in OR  Informed Consent: I have reviewed the patients History and Physical, chart, labs and discussed the procedure including the risks, benefits and alternatives for the proposed anesthesia with the patient or authorized representative who has indicated his/her understanding and acceptance.     Dental advisory given  Plan Discussed with: CRNA and Anesthesiologist  Anesthesia Plan Comments:        Anesthesia Quick Evaluation

## 2019-08-14 ENCOUNTER — Other Ambulatory Visit: Payer: Self-pay

## 2019-08-14 ENCOUNTER — Ambulatory Visit (HOSPITAL_COMMUNITY)
Admission: RE | Admit: 2019-08-14 | Discharge: 2019-08-14 | Disposition: A | Discharge status: Home / Self Care | Payer: Medicare Other | Attending: Cardiology | Admitting: Cardiology

## 2019-08-14 ENCOUNTER — Ambulatory Visit (HOSPITAL_COMMUNITY): Payer: Medicare Other | Admitting: Anesthesiology

## 2019-08-14 ENCOUNTER — Encounter (HOSPITAL_COMMUNITY)
Admission: RE | Disposition: A | Discharge status: Home / Self Care | Payer: Self-pay | Source: Home / Self Care | Attending: Cardiology

## 2019-08-14 ENCOUNTER — Encounter (HOSPITAL_COMMUNITY): Payer: Self-pay | Admitting: Anesthesiology

## 2019-08-14 DIAGNOSIS — I4892 Unspecified atrial flutter: Secondary | ICD-10-CM | POA: Insufficient documentation

## 2019-08-14 DIAGNOSIS — F329 Major depressive disorder, single episode, unspecified: Secondary | ICD-10-CM | POA: Insufficient documentation

## 2019-08-14 DIAGNOSIS — Z79899 Other long term (current) drug therapy: Secondary | ICD-10-CM | POA: Diagnosis not present

## 2019-08-14 DIAGNOSIS — I480 Paroxysmal atrial fibrillation: Secondary | ICD-10-CM | POA: Insufficient documentation

## 2019-08-14 DIAGNOSIS — Z7901 Long term (current) use of anticoagulants: Secondary | ICD-10-CM | POA: Diagnosis not present

## 2019-08-14 HISTORY — PX: A-FLUTTER ABLATION: EP1230

## 2019-08-14 SURGERY — A-FLUTTER ABLATION
Anesthesia: General

## 2019-08-14 MED ORDER — ONDANSETRON HCL 4 MG/2ML IJ SOLN
INTRAMUSCULAR | Status: DC | PRN
  Administered 2019-08-14: 4 mg via INTRAVENOUS

## 2019-08-14 MED ORDER — FENTANYL CITRATE (PF) 100 MCG/2ML IJ SOLN
INTRAMUSCULAR | Status: DC | PRN
  Administered 2019-08-14 (×2): 50 ug via INTRAVENOUS

## 2019-08-14 MED ORDER — LIDOCAINE 2% (20 MG/ML) 5 ML SYRINGE
INTRAMUSCULAR | Status: DC | PRN
  Administered 2019-08-14: 100 mg via INTRAVENOUS

## 2019-08-14 MED ORDER — MIDAZOLAM HCL 5 MG/5ML IJ SOLN
INTRAMUSCULAR | Status: DC | PRN
  Administered 2019-08-14: 2 mg via INTRAVENOUS

## 2019-08-14 MED ORDER — DEXAMETHASONE SODIUM PHOSPHATE 10 MG/ML IJ SOLN
INTRAMUSCULAR | Status: DC | PRN
  Administered 2019-08-14: 10 mg via INTRAVENOUS

## 2019-08-14 MED ORDER — HEPARIN (PORCINE) IN NACL 1000-0.9 UT/500ML-% IV SOLN
INTRAVENOUS | Status: AC
  Filled 2019-08-14: qty 500

## 2019-08-14 MED ORDER — PROPOFOL 10 MG/ML IV BOLUS
INTRAVENOUS | Status: DC | PRN
  Administered 2019-08-14: 200 mg via INTRAVENOUS

## 2019-08-14 MED ORDER — SODIUM CHLORIDE 0.9 % IV SOLN
INTRAVENOUS | Status: DC
  Administered 2019-08-14: 07:00:00 via INTRAVENOUS

## 2019-08-14 MED ORDER — SODIUM CHLORIDE 0.9% FLUSH: 3.0000 mL | Freq: Two times a day (BID) | INTRAVENOUS | Status: DC

## 2019-08-14 MED ORDER — SODIUM CHLORIDE 0.9 % IV SOLN: 250.0000 mL | INTRAVENOUS | Status: DC | PRN

## 2019-08-14 MED ORDER — BUPIVACAINE HCL (PF) 0.25 % IJ SOLN
INTRAMUSCULAR | Status: AC
  Filled 2019-08-14: qty 60

## 2019-08-14 MED ORDER — BUPIVACAINE HCL (PF) 0.25 % IJ SOLN
INTRAMUSCULAR | Status: DC | PRN
  Administered 2019-08-14: 30 mL

## 2019-08-14 MED ORDER — PHENYLEPHRINE 40 MCG/ML (10ML) SYRINGE FOR IV PUSH (FOR BLOOD PRESSURE SUPPORT)
PREFILLED_SYRINGE | INTRAVENOUS | Status: DC | PRN
  Administered 2019-08-14: 80 ug via INTRAVENOUS
  Administered 2019-08-14: 120 ug via INTRAVENOUS

## 2019-08-14 MED ORDER — SUGAMMADEX SODIUM 200 MG/2ML IV SOLN
INTRAVENOUS | Status: DC | PRN
  Administered 2019-08-14: 150 mg via INTRAVENOUS

## 2019-08-14 MED ORDER — ROCURONIUM BROMIDE 10 MG/ML (PF) SYRINGE
PREFILLED_SYRINGE | INTRAVENOUS | Status: DC | PRN
  Administered 2019-08-14: 80 mg via INTRAVENOUS

## 2019-08-14 MED ORDER — PHENYLEPHRINE HCL-NACL 10-0.9 MG/250ML-% IV SOLN
INTRAVENOUS | Status: DC | PRN
  Administered 2019-08-14: 40 ug/min via INTRAVENOUS

## 2019-08-14 MED ORDER — EPHEDRINE SULFATE-NACL 50-0.9 MG/10ML-% IV SOSY
PREFILLED_SYRINGE | INTRAVENOUS | Status: DC | PRN
  Administered 2019-08-14 (×2): 5 mg via INTRAVENOUS

## 2019-08-14 MED ORDER — ONDANSETRON HCL 4 MG/2ML IJ SOLN: 4.0000 mg | Freq: Four times a day (QID) | INTRAMUSCULAR | Status: DC | PRN

## 2019-08-14 MED ORDER — HEPARIN (PORCINE) IN NACL 1000-0.9 UT/500ML-% IV SOLN
INTRAVENOUS | Status: DC | PRN
  Administered 2019-08-14 (×2): 500 mL

## 2019-08-14 MED ORDER — ACETAMINOPHEN 325 MG PO TABS: 650.0000 mg | ORAL_TABLET | ORAL | Status: DC | PRN

## 2019-08-14 MED ORDER — SODIUM CHLORIDE 0.9% FLUSH: 3.0000 mL | INTRAVENOUS | Status: DC | PRN

## 2019-08-14 SURGICAL SUPPLY — 11 items
CATH EZ STEER NAV 8MM F-J CUR (ABLATOR) ×2 IMPLANT
CATH WEBSTER BI DIR CS D-F CRV (CATHETERS) ×2 IMPLANT
DEVICE CLOSURE PERCLS PRGLD 6F (VASCULAR PRODUCTS) ×2 IMPLANT
PACK EP LATEX FREE (CUSTOM PROCEDURE TRAY) ×1
PACK EP LF (CUSTOM PROCEDURE TRAY) ×1 IMPLANT
PAD PRO RADIOLUCENT 2001M-C (PAD) ×2 IMPLANT
PATCH CARTO3 (PAD) ×2 IMPLANT
PERCLOSE PROGLIDE 6F (VASCULAR PRODUCTS) ×4
SHEATH PINNACLE 7F 10CM (SHEATH) ×2 IMPLANT
SHEATH PINNACLE 8F 10CM (SHEATH) ×2 IMPLANT
SHEATH PROBE COVER 6X72 (BAG) ×2 IMPLANT

## 2019-08-14 NOTE — Transfer of Care (Signed)
Immediate Anesthesia Transfer of Care Note  Patient: Nathaniel Bush  Procedure(s) Performed: A-FLUTTER ABLATION (N/A )  Patient Location: PACU  Anesthesia Type:General  Level of Consciousness: oriented, drowsy and patient cooperative  Airway & Oxygen Therapy: Patient Spontanous Breathing and Patient connected to nasal cannula oxygen  Post-op Assessment: Report given to RN and Post -op Vital signs reviewed and stable  Post vital signs: Reviewed  Last Vitals:  Vitals Value Taken Time  BP 121/66 08/14/19 1005  Temp 36.2 C 08/14/19 1005  Pulse 70 08/14/19 1009  Resp 17 08/14/19 1009  SpO2 100 % 08/14/19 1009  Vitals shown include unvalidated device data.  Last Pain:  Vitals:   08/14/19 1005  TempSrc: Temporal  PainSc: 0-No pain         Complications: No apparent anesthesia complications

## 2019-08-14 NOTE — Anesthesia Procedure Notes (Signed)
Procedure Name: Intubation Date/Time: 08/14/2019 8:18 AM Performed by: Jenne Campus, CRNA Pre-anesthesia Checklist: Patient identified, Emergency Drugs available, Suction available and Patient being monitored Patient Re-evaluated:Patient Re-evaluated prior to induction Oxygen Delivery Method: Circle System Utilized Preoxygenation: Pre-oxygenation with 100% oxygen Induction Type: IV induction Ventilation: Mask ventilation without difficulty Laryngoscope Size: Miller and 2 Grade View: Grade I Tube type: Oral Tube size: 7.5 mm Number of attempts: 1 Airway Equipment and Method: Stylet Placement Confirmation: ETT inserted through vocal cords under direct vision,  positive ETCO2 and breath sounds checked- equal and bilateral Secured at: 22 cm Tube secured with: Tape Dental Injury: Teeth and Oropharynx as per pre-operative assessment

## 2019-08-14 NOTE — Discharge Instructions (Signed)
Post procedure care instructions No driving for 4 days. No lifting over 5 lbs for 1 week. No vigorous or sexual activity for 1 week. You may return to work/usual activities on 08/21/2019. Keep procedure site clean & dry. If you notice increased pain, swelling, bleeding or pus, call/return!  You may shower, but no soaking baths/hot tubs/pools for 1 week.      Cardiac Ablation, Care After This sheet gives you information about how to care for yourself after your procedure. Your health care provider may also give you more specific instructions. If you have problems or questions, contact your health care provider. What can I expect after the procedure? After the procedure, it is common to have:  Bruising around your puncture site.  Tenderness around your puncture site.  Skipped heartbeats.  Tiredness (fatigue).  Follow these instructions at home: Puncture site care   Follow instructions from your health care provider about how to take care of your puncture site. Make sure you: ? If present, leave stitches (sutures), skin glue, or adhesive strips in place. These skin closures may need to stay in place for up to 2 weeks. If adhesive strip edges start to loosen and curl up, you may trim the loose edges. Do not remove adhesive strips completely unless your health care provider tells you to do that.  Check your puncture site every day for signs of infection. Check for: ? Redness, swelling, or pain. ? Fluid or blood. If your puncture site starts to bleed, lie down on your back, apply firm pressure to the area, and contact your health care provider. ? Warmth. ? Pus or a bad smell. Driving  Do not drive for at least 4 days after your procedure or however long your health care provider recommends.  Do not drive or use heavy machinery while taking prescription pain medicine.  Do not drive for 24 hours if you were given a medicine to help you relax (sedative) during your  procedure. Activity  Avoid activities that take a lot of effort for at least 7 days after your procedure.  Do not lift anything that is heavier than 5 lb (4.5 kg) for one week.   No sexual activity for 1 week.   Return to your normal activities as told by your health care provider. Ask your health care provider what activities are safe for you. General instructions  Take over-the-counter and prescription medicines only as told by your health care provider.  Do not use any products that contain nicotine or tobacco, such as cigarettes and e-cigarettes. If you need help quitting, ask your health care provider.  You may shower after 24 hours, but Do not take baths, swim, or use a hot tub for 1 week.   Do not drink alcohol for 24 hours after your procedure.  Keep all follow-up visits as told by your health care provider. This is important. Contact a health care provider if:  You have redness, mild swelling, or pain around your puncture site.  You have fluid or blood coming from your puncture site that stops after applying firm pressure to the area.  Your puncture site feels warm to the touch.  You have pus or a bad smell coming from your puncture site.  You have a fever.  You have chest pain or discomfort that spreads to your neck, jaw, or arm.  You are sweating a lot.  You feel nauseous.  You have a fast or irregular heartbeat.  You have shortness of breath.  You are dizzy or light-headed and feel the need to lie down.  You have pain or numbness in the arm or leg closest to your puncture site. Get help right away if:  Your puncture site suddenly swells.  Your puncture site is bleeding and the bleeding does not stop after applying firm pressure to the area. These symptoms may represent a serious problem that is an emergency. Do not wait to see if the symptoms will go away. Get medical help right away. Call your local emergency services (911 in the [place] Do not drive  yourself to the hospital. Summary  After the procedure, it is normal to have bruising and tenderness at the puncture site in your groin, neck, or forearm.  Check your puncture site every day for signs of infection.  Get help right away if your puncture site is bleeding and the bleeding does not stop after applying firm pressure to the area. This is a medical emergency. This information is not intended to replace advice given to you by your health care provider. Make sure you discuss any questions you have with your health care provider.

## 2019-08-14 NOTE — Anesthesia Postprocedure Evaluation (Signed)
Anesthesia Post Note  Patient: Nathaniel Bush  Procedure(s) Performed: A-FLUTTER ABLATION (N/A )     Patient location during evaluation: PACU Anesthesia Type: General Level of consciousness: sedated Pain management: pain level controlled Vital Signs Assessment: post-procedure vital signs reviewed and stable Respiratory status: spontaneous breathing and respiratory function stable Cardiovascular status: stable Postop Assessment: no apparent nausea or vomiting Anesthetic complications: no    Last Vitals:  Vitals:   08/14/19 1050 08/14/19 1107  BP: 116/70 117/74  Pulse: 62 66  Resp: 15 15  Temp:    SpO2: 100% 99%    Last Pain:  Vitals:   08/14/19 1107  TempSrc:   PainSc: 0-No pain                 Sanela Evola DANIEL

## 2019-08-14 NOTE — H&P (Signed)
Nathaniel Bush has presented today for surgery, with the diagnosis of atrial flutter.  The various methods of treatment have been discussed with the patient and family. After consideration of risks, benefits and other options for treatment, the patient has consented to  Procedure(s): Catheter ablation as a surgical intervention .  Risks include but not limited to bleeding, tamponade, heart block, stroke, damage to surrounding organs, among others. The patient's history has been reviewed, patient examined, no change in status, stable for surgery.  I have reviewed the patient's chart and labs.  Questions were answered to the patient's satisfaction.    Will Curt Bears, MD 08/14/2019 7:10 AM

## 2019-08-14 NOTE — Progress Notes (Signed)
No bleeding or hematoma noted after ambulation 

## 2019-08-17 ENCOUNTER — Other Ambulatory Visit: Payer: Self-pay | Admitting: Psychiatry

## 2019-08-17 NOTE — Telephone Encounter (Signed)
Informed pt Dr Curt Bears recommends cancelling appt tomorrow w/ Dr. Percival Spanish d/t reported symptoms.  Pt aware office will call to reschedule.  Pt had me speak with his wife who reports she is concerned that groin site may be infected.  Reports red, bruised, warm to touch, swollen.  She will send in pictures via mychart this evening so Dr. Curt Bears can review in the morning

## 2019-08-17 NOTE — Progress Notes (Signed)
Pt states he started with a fever and cold/ congestion SX on Saturday. He now states the temp is gone and feels like he has a cold. No problem with site.

## 2019-08-18 ENCOUNTER — Ambulatory Visit: Payer: Medicare Other | Admitting: Cardiology

## 2019-08-19 NOTE — Telephone Encounter (Signed)
Followed up w/ pt per Dr. Curt Bears request. Pt reports no fever last night or today.  States he is feeling good. Advised pt to let us know if fever re-occurs and/or groin swelling worsens. Patient verbalized understanding and agreeable to plan.  Will forward to [facility] for his FYI.

## 2019-09-08 ENCOUNTER — Telehealth: Payer: Self-pay

## 2019-09-08 ENCOUNTER — Other Ambulatory Visit: Payer: Self-pay | Admitting: Orthopedic Surgery

## 2019-09-08 NOTE — Telephone Encounter (Signed)
   [facility] Pre-operative Risk Assessment    Request for surgical clearance:  1. What type of surgery is being performed? Left sided lumbar 4-5 LUMBAR-SACRUM, TRANSACROMINAL LUMBAR INTERBODY FUSION W/INSTRUMENTATION AN ALLOGRAFT   2. When is this surgery scheduled? 10-08-2019   3. What type of clearance is required (medical clearance vs. Pharmacy clearance to hold med vs. Both)? BOTH  4. Are there any medications that need to be held prior to surgery and how long? NOT LISTED PT TAKES ELIQUIS   5. Practice name and name of physician performing surgery? [facility] MARK DUMONSKI, MD ATTN:DONNA   6. What is your office phone number  [phone]    7.   What is your office fax number [phone]  8.   Anesthesia type (None, local, MAC, general) ? NOT LISTED

## 2019-09-08 NOTE — Telephone Encounter (Addendum)
Pt takes Eliquis for aflutter post ablation on 08/14/19 with CHADS2VASc score of 1 (age). Discussed with Dr Curt Bears - since ablation was for aflutter, not afib, pt should be able to stop his Eliquis 1 month after ablation. Pt has an appt with Dr Curt Bears on 12/17 and will discuss permanent Eliquis d/c at that time.

## 2019-09-08 NOTE — Telephone Encounter (Signed)
Pharmacy can you comment on holding Eliquis in this patient. He had an atrial flutter ablation 08/14/2019, surgery scheduled for 10/08/19.  Kerin Ransom PA-C 09/08/2019 3:00 PM

## 2019-09-09 ENCOUNTER — Telehealth: Payer: Self-pay | Admitting: *Deleted

## 2019-09-09 NOTE — Telephone Encounter (Signed)
Patient has follow up with me on 12/17. Nathaniel Bush put preop eval in the chart at that time and discuss anticoagulation during that visit.

## 2019-09-09 NOTE — Telephone Encounter (Signed)
   [facility] Pre-operative Risk Assessment    Request for surgical clearance: SURG CLEARANCE WAS PUT IN YESTERDAY FOR SAME SURGERY TO BE DONE WITH [facility]  1. What type of surgery is being performed? LUMBAR FUSION   2. When is this surgery scheduled? TBD   3. What type of clearance is required (medical clearance vs. Pharmacy clearance to hold med vs. Both)? BOTH  4. Are there any medications that need to be held prior to surgery and how long? ELIQUIS   5. Practice name and name of physician performing surgery? [facility]; DR. Arnoldo Morale   6. What is your office phone number [phone]    7.   What is your office fax number [phone] ATTN: [email or web address]  8.   Anesthesia type (None, local, MAC, general) ? GENERAL   Julaine Hua 09/09/2019, 11:57 AM  _________________________________________________________________   (provider comments below)

## 2019-09-09 NOTE — Telephone Encounter (Signed)
This patient has an appointment with Dr Curt Bears on 09/17/2019- pre op clearance and anticoagulation to be discussed at that appointment.   Kerin Ransom PA-C 09/09/2019 8:41 AM

## 2019-09-09 NOTE — Telephone Encounter (Addendum)
The patient will be seen by Dr Curt Bears in the office 12/17- pre op clearance and anticoagulation to be discussed at that visit. I will forward this to the operating surgeon.   Kerin Ransom PA-C 09/09/2019 4:35 PM

## 2019-09-09 NOTE — Telephone Encounter (Signed)
Dr Curt Bears this patient is requesting cardiac clearance for lumbar fusion. He is s/p RFA for A-flutter 08/14/2019.  He tells me he was instructed to stop his anticoagulation 30 days post RFA which would be 12/13, though I don't see that documented.  Also, does he needs any SBE prophylaxis > 30 days post RFA?  He did not want to wait until his 12/15 office visit to discuss this, he wants to be cleared ASAP.   Thanks  Kerin Ransom PA-C 09/09/2019 1:38 PM

## 2019-09-10 ENCOUNTER — Other Ambulatory Visit: Payer: Self-pay | Admitting: Neurosurgery

## 2019-09-14 DIAGNOSIS — Z01810 Encounter for preprocedural cardiovascular examination: Secondary | ICD-10-CM | POA: Insufficient documentation

## 2019-09-14 DIAGNOSIS — Z7189 Other specified counseling: Secondary | ICD-10-CM | POA: Insufficient documentation

## 2019-09-14 NOTE — Progress Notes (Signed)
Cardiology Office Note   Date:  09/15/2019   ID:  Nathaniel Bush, DOB Oct 27, 1950, MRN [id number]  PCP:  Crist Infante, MD  Cardiologist:   Minus Breeding, MD   Chief Complaint  Patient presents with  . Atrial Flutter      History of Present Illness: Nathaniel Bush is a 68 y.o. male who follow up of atrial fib.   He is status post atrial flutter ablation.  He has lumbar fusion planned.  It was suggested that he could stop his Eliquis on the 13th of this month one month after the ablation.   Since I last saw him he has a few palpitations.  However, he is done remarkably well.  He is back to riding his bike and exercising routinely. The patient denies any new symptoms such as chest discomfort, neck or arm discomfort. There has been no new shortness of breath, PND or orthopnea. There have been no reportedpresyncope or syncope.   His monitors demonstrated normal sinus rhythm.  He is no longer had any of the rapid rate that was his atrial arrhythmia.   Past Medical History:  Diagnosis Date  . Depression   . PAF (paroxysmal atrial fibrillation) ([place])   . Palpitations     Past Surgical History:  Procedure Laterality Date  . A-FLUTTER ABLATION N/A 08/14/2019   Procedure: A-FLUTTER ABLATION;  Surgeon: Constance Haw, MD;  Location: [facility];  Service: Cardiovascular;  Laterality: N/A;  . INGUINAL HERNIA REPAIR    . TOE SURGERY       Current Outpatient Medications  Medication Sig Dispense Refill  . ALPRAZolam (XANAX) 0.25 MG tablet Take 1 tablet (0.25 mg total) by mouth daily as needed for anxiety. 15 tablet 0  . apixaban (ELIQUIS) 5 MG TABS tablet Take 1 tablet (5 mg total) by mouth 2 (two) times daily. 60 tablet 3  . lithium carbonate (ESKALITH) 450 MG CR tablet TAKE 1 TABLET BY MOUTH  DAILY 90 tablet 0  . lithium carbonate 300 MG capsule TAKE 1 CAPSULE BY MOUTH  DAILY 90 capsule 0   No current facility-administered medications for this visit.    Allergies:    Patient has no known allergies.    ROS:  Please see the history of present illness.   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   All other systems are reviewed and negative.    PHYSICAL EXAM: VS:  BP 132/76   Pulse (!) 53   Ht 5\' 10"  (1.778 m)   Wt 165 lb 12.8 oz (75.2 kg)   BMI 23.79 kg/m  , BMI Body mass index is 23.79 kg/m. GENERAL:  Well appearing NECK:  No jugular venous distention, waveform within normal limits, carotid upstroke brisk and symmetric, no bruits, no thyromegaly LUNGS:  Clear to auscultation bilaterally CHEST:  Unremarkable HEART:  PMI not displaced or sustained,S1 and S2 within normal limits, no S3, no S4, no clicks, no rubs, no murmurs ABD:  Flat, positive bowel sounds normal in frequency in pitch, no bruits, no rebound, no guarding, no midline pulsatile mass, no hepatomegaly, no splenomegaly EXT:  2 plus pulses throughout, no edema, no cyanosis no clubbing    EKG:  EKG is ordered today. The ekg ordered today demonstrates sinus bradycardia, rate 3, leftward axis, poor anterior R wave progression.   Recent Labs: 07/07/2019: ALT 17; Magnesium 2.4; TSH 3.170 07/21/2019: BUN 15; Creatinine, Ser 1.04; Hemoglobin 14.7; Platelets 189; Potassium 5.2; Sodium 140    Lipid  Panel No results found for: CHOL, TRIG, HDL, CHOLHDL, VLDL, LDLCALC, LDLDIRECT    Wt Readings from Last 3 Encounters:  09/15/19 165 lb 12.8 oz (75.2 kg)  08/14/19 158 lb (71.7 kg)  07/21/19 161 lb 6.4 oz (73.2 kg)      Other studies Reviewed: Additional studies/ records that were reviewed today include: None. Review of the above records demonstrates:  Please see elsewhere in the note.     ASSESSMENT AND PLAN:  ATRIAL FLUTTER:   The procedure seems to have been curative.  He is status post ablation.  He is off his anticoagulation tomorrow.  No change in therapy.  No further evaluation.  PREOP:  He is to have lumbar fusion.  He is at acceptable risk for the planned  procedure.  No further testing is suggested.  COVID EDUCATION: We talked about the vaccine.  Current medicines are reviewed at length with the patient today.  The patient does not have concerns regarding medicines.  The following changes have been made:  no change  Labs/ tests ordered today include: None  Orders Placed This Encounter  Procedures  . EKG 12-Lead     Disposition:   FU with me in one year.     Signed, Rollene Rotunda, MD  09/15/2019 2:22 PM    [facility]

## 2019-09-15 ENCOUNTER — Encounter: Payer: Self-pay | Admitting: Cardiology

## 2019-09-15 ENCOUNTER — Ambulatory Visit: Payer: Medicare Other | Admitting: Cardiology

## 2019-09-15 ENCOUNTER — Encounter: Payer: Self-pay | Admitting: Psychiatry

## 2019-09-15 ENCOUNTER — Ambulatory Visit (INDEPENDENT_AMBULATORY_CARE_PROVIDER_SITE_OTHER): Payer: Medicare Other | Admitting: Psychiatry

## 2019-09-15 ENCOUNTER — Other Ambulatory Visit: Payer: Self-pay

## 2019-09-15 VITALS — BP 132/76 | HR 53 | Ht 70.0 in | Wt 165.8 lb

## 2019-09-15 DIAGNOSIS — Z7189 Other specified counseling: Secondary | ICD-10-CM

## 2019-09-15 DIAGNOSIS — Z01810 Encounter for preprocedural cardiovascular examination: Secondary | ICD-10-CM

## 2019-09-15 DIAGNOSIS — I483 Typical atrial flutter: Secondary | ICD-10-CM | POA: Diagnosis not present

## 2019-09-15 DIAGNOSIS — F411 Generalized anxiety disorder: Secondary | ICD-10-CM | POA: Diagnosis not present

## 2019-09-15 DIAGNOSIS — F4310 Post-traumatic stress disorder, unspecified: Secondary | ICD-10-CM | POA: Diagnosis not present

## 2019-09-15 DIAGNOSIS — F319 Bipolar disorder, unspecified: Secondary | ICD-10-CM

## 2019-09-15 NOTE — Patient Instructions (Signed)
Medication Instructions:  Your physician recommends that you continue on your current medications as directed. Please refer to the Current Medication list given to you today.  *If you need a refill on your cardiac medications before your next appointment, please call your pharmacy*  Lab Work: noen If you have labs (blood work) drawn today and your tests are completely normal, you will receive your results only by: Marland Kitchen MyChart Message (if you have MyChart) OR . A paper copy in the mail If you have any lab test that is abnormal or we need to change your treatment, we will call you to review the results.  Testing/Procedures: none  Follow-Up: At [facility], you and your health needs are our priority.  As part of our continuing mission to provide you with exceptional heart care, we have created designated Provider Care Teams.  These Care Teams include your primary Cardiologist (physician) and Advanced Practice Providers (APPs -  Physician Assistants and Nurse Practitioners) who all work together to provide you with the care you need, when you need it.  Your next appointment:   12 month(s)  The format for your next appointment:   Either In Person or Virtual  Provider:   DR. Percival Spanish

## 2019-09-15 NOTE — Patient Instructions (Signed)
Add vitamin D 2000 to 5000 units daily through the winter

## 2019-09-15 NOTE — Progress Notes (Signed)
Nathaniel Bush [id number] [birth year] 68 y.o.  Subjective:   [name] is a 68 y.o. (DOB Jul 03, 1951) male.  Chief Complaint:  Chief Complaint  Patient presents with  . Follow-up    Medication Management  . Other    Bipolar affective disorder, rapid cycling    HPI   Nathaniel Bush presents to the office today for follow-up of depression and anxiety.    Last seen in March 16, 2019.  It was relatively stable except for marital stress.  No meds were changed.  Had Aflutter with successful ablation.  Overall mood good without change since here.  Nothing's changed.  Including meds.  Handling stress of socialization and Covid very well.  No unusual issues.. Really slowed down watching the news bc creating stress.  Not taking much Xanax.   No problems with the lithium.   Not depressed but frustrated with marriage. Patient reports stable mood and denies depressed or irritable moods.  Patient denies any recent difficulty with anxiety.  Patient denies difficulty with sleep initiation or maintenance. Denies appetite disturbance.  Patient reports that energy and motivation have been good.  Patient denies any difficulty with concentration.  Patient denies any suicidal ideation.  Going to PCP on the July 7 and will have lithium level.  Still riding bike regularly.  Fastest time this year yesterday.  Past psychiatric medication trials include in [place], Cerefolin NAC, Deplin, Viibryd, mirtazapine, Cytomel, lamotrigine, venlafaxine, buspirone, carbamazepine, Trileptal, risperidone, Abilify, nefazodone, mirtazapine, Viibryd which cause GI pain, trazodone 25 mg which cause manic symptoms, Wellbutrin, duloxetine, Celexa, Lexapro, propranolol Zoloft, Wellbutrin, Trintellix.  I am uncertain if he is taking Depakote before  Review of Systems:  Review of Systems  Musculoskeletal: Positive for arthralgias and back pain.  Neurological: Negative for tremors and weakness.   Psychiatric/Behavioral: Negative for agitation, behavioral problems, confusion, decreased concentration, dysphoric mood, hallucinations, self-injury, sleep disturbance and suicidal ideas. The patient is not nervous/anxious and is not hyperactive.     Medications: I have reviewed the patient's current medications.  Current Outpatient Medications  Medication Sig Dispense Refill  . ALPRAZolam (XANAX) 0.25 MG tablet Take 1 tablet (0.25 mg total) by mouth daily as needed for anxiety. 15 tablet 0  . apixaban (ELIQUIS) 5 MG TABS tablet Take 1 tablet (5 mg total) by mouth 2 (two) times daily. 60 tablet 3  . lithium carbonate (ESKALITH) 450 MG CR tablet TAKE 1 TABLET BY MOUTH  DAILY 90 tablet 0  . lithium carbonate 300 MG capsule TAKE 1 CAPSULE BY MOUTH  DAILY 90 capsule 0   No current facility-administered medications for this visit.    Medication Side Effects: None  Allergies: No Known Allergies  Past Medical History:  Diagnosis Date  . Depression   . PAF (paroxysmal atrial fibrillation) ([place])   . Palpitations     Family History  Problem Relation Age of Onset  . Atrial fibrillation Father        pacemaker  . Heart disease Paternal Grandmother        pacemaker    Social History   Socioeconomic History  . Marital status: Married    Spouse name: Not on file  . Number of children: 2  . Years of education: Not on file  . Highest education level: Not on file  Occupational History  . Not on file  Tobacco Use  . Smoking status: Never Smoker  . Smokeless tobacco: Never Used  Substance and Sexual Activity  . Alcohol use: Yes  Alcohol/week: 3.0 standard drinks    Types: 2 Glasses of wine, 1 Cans of beer per week    Comment: daily  . Drug use: Never  . Sexual activity: Not on file  Other Topics Concern  . Not on file  Social History Narrative   Lives at home with wife.  Retired Futures traderpolice detective.    Social Determinants of Health   Financial Resource Strain:   .  Difficulty of Paying Living Expenses: Not on file  Food Insecurity:   . Worried About Programme researcher, broadcasting/film/videounning Out of Food in the Last Year: Not on file  . Ran Out of Food in the Last Year: Not on file  Transportation Needs:   . Lack of Transportation (Medical): Not on file  . Lack of Transportation (Non-Medical): Not on file  Physical Activity:   . Days of Exercise per Week: Not on file  . Minutes of Exercise per Session: Not on file  Stress:   . Feeling of Stress : Not on file  Social Connections:   . Frequency of Communication with Friends and Family: Not on file  . Frequency of Social Gatherings with Friends and Family: Not on file  . Attends Religious Services: Not on file  . Active Member of Clubs or Organizations: Not on file  . Attends BankerClub or Organization Meetings: Not on file  . Marital Status: Not on file  Intimate Partner Violence:   . Fear of Current or Ex-Partner: Not on file  . Emotionally Abused: Not on file  . Physically Abused: Not on file  . Sexually Abused: Not on file    Past Medical History, Surgical history, Social history, and Family history were reviewed and updated as appropriate.   W has no immediate plans to retire.  Please see review of systems for further details on the patient's review from today.   Objective:   Physical Exam:  There were no vitals taken for this visit.  Physical Exam Constitutional:      General: He is not in acute distress.    Appearance: He is well-developed.  Musculoskeletal:        General: No deformity.  Neurological:     Mental Status: He is alert and oriented to person, place, and time.     Motor: No tremor.     Coordination: Coordination normal.     Gait: Gait normal.  Psychiatric:        Attention and Perception: He is attentive.        Mood and Affect: Mood is not anxious or depressed. Affect is not labile, blunt, angry or inappropriate.        Speech: Speech normal.        Behavior: Behavior normal.        Thought  Content: Thought content normal. Thought content does not include homicidal or suicidal ideation. Thought content does not include homicidal or suicidal plan.        Judgment: Judgment normal.     Comments: Insight intact. No auditory or visual hallucinations.  Recent intrusive thoughts resolved.     Lab Review:     Component Value Date/Time   NA 140 07/21/2019 1001   K 5.2 07/21/2019 1001   CL 103 07/21/2019 1001   CO2 24 07/21/2019 1001   GLUCOSE 101 (H) 07/21/2019 1001   BUN 15 07/21/2019 1001   CREATININE 1.04 07/21/2019 1001   CALCIUM 9.9 07/21/2019 1001   PROT 6.5 07/07/2019 1106   ALBUMIN 4.6 07/07/2019 1106  AST 20 07/07/2019 1106   ALT 17 07/07/2019 1106   ALKPHOS 54 07/07/2019 1106   BILITOT 1.5 (H) 07/07/2019 1106   GFRNONAA 73 07/21/2019 1001   GFRAA 85 07/21/2019 1001       Component Value Date/Time   WBC 7.2 07/21/2019 1001   RBC 4.68 07/21/2019 1001   HGB 14.7 07/21/2019 1001   HCT 42.0 07/21/2019 1001   PLT 189 07/21/2019 1001   MCV 90 07/21/2019 1001   MCH 31.4 07/21/2019 1001   MCHC 35.0 07/21/2019 1001   RDW 11.8 07/21/2019 1001   LYMPHSABS 1.6 07/07/2019 1106   EOSABS 0.3 07/07/2019 1106   BASOSABS 0.1 07/07/2019 1106    No results found for: POCLITH, LITHIUM   No results found for: PHENYTOIN, PHENOBARB, VALPROATE, CBMZ  Last lithium level May 13, 2018 on 750 mg lithium per day was 0.7  Lithium December 18, 0.8  Labs received did not dated April 07, 2019: Normal BMP with creatinine 1.0 calcium 9.6.  Normal CBC and lipid profile.  Normal TSH Vitamin D 36.9 Testosterone 7.6 with normal 6.6-18.1 Lithium 0.8 No indication for  .res Assessment: Plan:   There are no diagnoses linked to this encounter.   Medication sensitive.  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  He is medication sensitive and is not typically stable for very long.  Part of the problem is he has a tendency to change medications on his own which  complicates treatment.  Fortunately he has not done that since the last 2 appointment.  We reinforced the importance of compliance.  Reviewed the long list of prior psychiatric medications.  He has some benefit from the lithium for his depression which is very helpful.  Her lithium level is stable.  He is currently stable for several months.  He acknowledges the benefit of the lithium overall and that his overall mood has been good for the last several months.  Marital stresses chronic but no worse.  No med changes today and he agrees with the exception of adding vitamin D  Disc labs above at length including normal BMP, CBC, TSH, testosterone in the low normal range and vitamin D in the low normal range.  Recommend vitamin D at least through the winter.  We discussed the potential mental health benefits for seasonal depression as well as recent studies suggesting middle age man with vitamin D levels in the top three quarters of the normal range have better long term health outcomes independent and dependent on Covid infections.Goal with vitamin D IMO above 50. Suggest supplement 2000-5000 units daily through the winter.  lithium level November at Dr. Laurey Morale office =0.8.  No change indicated. Counseled patient regarding potential benefits, risks, and side effects of lithium to include potential risk of lithium affecting thyroid and renal function.  Discussed need for periodic lab monitoring to determine drug level and to assess for potential adverse effects.  Counseled patient regarding signs and symptoms of lithium toxicity and advised that they notify office immediately or seek urgent medical attention if experiencing these signs and symptoms.  Patient advised to contact office with any questions or concerns.  6 mos  Meredith Staggers, MD, DFAPA  Please see After Visit Summary for patient specific instructions.  Future Appointments  Date Time Provider Department Center  09/15/2019  1:40 PM  Rollene Rotunda, MD [facility]  09/17/2019  8:45 AM Camnitz, Andree Coss, MD [facility]    No orders of the defined types were  placed in this encounter.     -------------------------------

## 2019-09-16 ENCOUNTER — Telehealth: Payer: Self-pay | Admitting: *Deleted

## 2019-09-16 NOTE — Telephone Encounter (Signed)
Virtual Visit Pre-Appointment Phone Call  "(Name), I am calling you today to discuss your upcoming appointment. We are currently trying to limit exposure to the virus that causes COVID-19 by seeing patients at home rather than in the office."  1. "What is the BEST phone number to call the day of the visit?" - include this in appointment notes  2. "Do you have or have access to (through a family member/friend) a smartphone with video capability that we can use for your visit?" a. If yes - list this number in appt notes as "cell" (if different from BEST phone #) and list the appointment type as a VIDEO visit in appointment notes b. If no - list the appointment type as a PHONE visit in appointment notes  3. Confirm consent - "In the setting of the current Covid19 crisis, you are scheduled for a (phone or video) visit with your provider on (date) at (time).  Just as we do with many in-office visits, in order for you to participate in this visit, we must obtain consent.  If you'd like, I can send this to your mychart (if signed up) or email for you to review.  Otherwise, I can obtain your verbal consent now.  All virtual visits are billed to your insurance company just like a normal visit would be.  By agreeing to a virtual visit, we'd like you to understand that the technology does not allow for your provider to perform an examination, and thus may limit your provider's ability to fully assess your condition. If your provider identifies any concerns that need to be evaluated in person, we will make arrangements to do so.  Finally, though the technology is pretty good, we cannot assure that it will always work on either your or our end, and in the setting of a video visit, we may have to convert it to a phone-only visit.  In either situation, we cannot ensure that we have a secure connection.  Are you willing to proceed?" STAFF: Did the patient verbally acknowledge consent to telehealth visit? Document  YES/NO here: YES  4. Advise patient to be prepared - "Two hours prior to your appointment, go ahead and check your blood pressure, pulse, oxygen saturation, and your weight (if you have the equipment to check those) and write them all down. When your visit starts, your provider will ask you for this information. If you have an Apple Watch or Kardia device, please plan to have heart rate information ready on the day of your appointment. Please have a pen and paper handy nearby the day of the visit as well."  5. Give patient instructions for MyChart download to smartphone OR Doximity/Doxy.me as below if video visit (depending on what platform provider is using)  6. Inform patient they will receive a phone call 15 minutes prior to their appointment time (may be from unknown caller ID) so they should be prepared to answer    TELEPHONE CALL NOTE  Nathaniel Bush has been deemed a candidate for a follow-up tele-health visit to limit community exposure during the Covid-19 pandemic. I spoke with the patient via phone to ensure availability of phone/video source, confirm preferred email & phone number, and discuss instructions and expectations.  I reminded Nathaniel Bush to be prepared with any vital sign and/or heart rhythm information that could potentially be obtained via home monitoring, at the time of his visit. I reminded Nathaniel Bush to expect a phone call prior to his visit.  Baird Lyons, RN 09/16/2019 9:00 AM   INSTRUCTIONS FOR DOWNLOADING THE MYCHART APP TO SMARTPHONE  - The patient must first make sure to have activated MyChart and know their login information - If Apple, go to [employer] and type in MyChart in the search bar and download the app. If Android, ask patient to go to [employer] and type in [place] in the search bar and download the app. The app is free but as with any other app downloads, their phone may require them to verify saved payment information or Apple/Android  password.  - The patient will need to then log into the app with their MyChart username and password, and select [facility] as their healthcare provider to link the account. When it is time for your visit, go to the MyChart app, find appointments, and click Begin Video Visit. Be sure to Select Allow for your device to access the Microphone and Camera for your visit. You will then be connected, and your provider will be with you shortly.  **If they have any issues connecting, or need assistance please contact MyChart service desk [phone])**  **If using a computer, in order to ensure the best quality for their visit they will need to use either of the following Internet Browsers: [employer] or Google Chrome**  IF USING DOXIMITY or DOXY.ME - The patient will receive a link just prior to their visit by text.     FULL LENGTH CONSENT FOR TELE-HEALTH VISIT   I hereby voluntarily request, consent and authorize [facility] and its employed or contracted physicians, physician assistants, nurse practitioners or other licensed health care professionals (the Practitioner), to provide me with telemedicine health care services (the "Services") as deemed necessary by the treating Practitioner. I acknowledge and consent to receive the Services by the Practitioner via telemedicine. I understand that the telemedicine visit will involve communicating with the Practitioner through live audiovisual communication technology and the disclosure of certain medical information by electronic transmission. I acknowledge that I have been given the opportunity to request an in-person assessment or other available alternative prior to the telemedicine visit and am voluntarily participating in the telemedicine visit.  I understand that I have the right to withhold or withdraw my consent to the use of telemedicine in the course of my care at any time, without affecting my right to future care or treatment,  and that the Practitioner or I may terminate the telemedicine visit at any time. I understand that I have the right to inspect all information obtained and/or recorded in the course of the telemedicine visit and may receive copies of available information for a reasonable fee.  I understand that some of the potential risks of receiving the Services via telemedicine include:  Marland Kitchen Delay or interruption in medical evaluation due to technological equipment failure or disruption; . Information transmitted may not be sufficient (e.g. poor resolution of images) to allow for appropriate medical decision making by the Practitioner; and/or  . In rare instances, security protocols could fail, causing a breach of personal health information.  Furthermore, I acknowledge that it is my responsibility to provide information about my medical history, conditions and care that is complete and accurate to the best of my ability. I acknowledge that Practitioner's advice, recommendations, and/or decision may be based on factors not within their control, such as incomplete or inaccurate data provided by me or distortions of diagnostic images or specimens that may result from electronic transmissions. I understand that  the practice of medicine is not an exact science and that Practitioner makes no warranties or guarantees regarding treatment outcomes. I acknowledge that I will receive a copy of this consent concurrently upon execution via email to the email address I last provided but may also request a printed copy by calling the office of [place].    I understand that my insurance will be billed for this visit.   I have read or had this consent read to me. . I understand the contents of this consent, which adequately explains the benefits and risks of the Services being provided via telemedicine.  . I have been provided ample opportunity to ask questions regarding this consent and the Services and have had my questions  answered to my satisfaction. . I give my informed consent for the services to be provided through the use of telemedicine in my medical care  By participating in this telemedicine visit I agree to the above.

## 2019-09-17 ENCOUNTER — Telehealth (INDEPENDENT_AMBULATORY_CARE_PROVIDER_SITE_OTHER): Payer: Medicare Other | Admitting: Cardiology

## 2019-09-17 ENCOUNTER — Other Ambulatory Visit: Payer: Self-pay

## 2019-09-17 DIAGNOSIS — I483 Typical atrial flutter: Secondary | ICD-10-CM

## 2019-09-17 NOTE — Progress Notes (Signed)
Electrophysiology TeleHealth Note   Due to national recommendations of social distancing due to COVID 19, an audio/video telehealth visit is felt to be most appropriate for this patient at this time.  See Epic message for the patient's consent to telehealth for [facility].   Date:  09/17/2019   ID:  [name], DOB 17-Jan-1951, MRN [id number]  Location: patient's home  Provider location: [address], [place]  Evaluation Performed: Follow-up visit  PCP:  Rodrigo Ran, MD  Cardiologist:  Rollene Rotunda, MD  Electrophysiologist:  Dr Elberta Fortis  Chief Complaint:  Atrial flutter  History of Present Illness:    Nathaniel Bush is a 68 y.o. male who presents via audio/video conferencing for a telehealth visit today.  Since last being seen in our clinic, the patient reports doing very well.  Today, he denies symptoms of palpitations, chest pain, shortness of breath,  lower extremity edema, dizziness, presyncope, or syncope.  The patient is otherwise without complaint today.  The patient denies symptoms of fevers, chills, cough, or new SOB worrisome for COVID 19.  He has a history significant for OCD, depression, atrial flutter.  He had an atrial flutter ablation 08/14/2019.  Today, denies symptoms of palpitations, chest pain, shortness of breath, orthopnea, PND, lower extremity edema, claudication, dizziness, presyncope, syncope, bleeding, or neurologic sequela. The patient is tolerating medications without difficulties.  He has been doing well since ablation.  He has had no further episodes of atrial flutter.  He continues to exercise without issue.  Past Medical History:  Diagnosis Date  . Depression   . PAF (paroxysmal atrial fibrillation) (HCC)   . Palpitations     Past Surgical History:  Procedure Laterality Date  . A-FLUTTER ABLATION N/A 08/14/2019   Procedure: A-FLUTTER ABLATION;  Surgeon: Regan Lemming, MD;  Location: [facility];  Service:  Cardiovascular;  Laterality: N/A;  . INGUINAL HERNIA REPAIR    . TOE SURGERY      Current Outpatient Medications  Medication Sig Dispense Refill  . ALPRAZolam (XANAX) 0.25 MG tablet Take 1 tablet (0.25 mg total) by mouth daily as needed for anxiety. 15 tablet 0  . lithium carbonate (ESKALITH) 450 MG CR tablet TAKE 1 TABLET BY MOUTH  DAILY 90 tablet 0  . lithium carbonate 300 MG capsule TAKE 1 CAPSULE BY MOUTH  DAILY 90 capsule 0  . apixaban (ELIQUIS) 5 MG TABS tablet Take 1 tablet (5 mg total) by mouth 2 (two) times daily. (Patient not taking: Reported on 09/16/2019) 60 tablet 3   No current facility-administered medications for this visit.    Allergies:   Patient has no known allergies.   Social History:  The patient  reports that he has never smoked. He has never used smokeless tobacco. He reports current alcohol use of about 3.0 standard drinks of alcohol per week. He reports that he does not use drugs.   Family History:  The patient's  family history includes Atrial fibrillation in his father; Heart disease in his paternal grandmother.   ROS:  Please see the history of present illness.   All other systems are personally reviewed and negative.    Exam:    Vital Signs:  BP 132/70   Pulse (!) 56   Well appearing, alert and conversant, regular work of breathing,  good skin color Eyes- anicteric, neuro- grossly intact, skin- no apparent rash or lesions or cyanosis, mouth- oral mucosa is pink   Labs/Other Tests and Data Reviewed:  Recent Labs: 07/07/2019: ALT 17; Magnesium 2.4; TSH 3.170 07/21/2019: BUN 15; Creatinine, Ser 1.04; Hemoglobin 14.7; Platelets 189; Potassium 5.2; Sodium 140   Wt Readings from Last 3 Encounters:  09/15/19 165 lb 12.8 oz (75.2 kg)  08/14/19 158 lb (71.7 kg)  07/21/19 161 lb 6.4 oz (73.2 kg)     Other studies personally reviewed: Additional studies/ records that were reviewed today include: ECG 10/15/18 personally reviewed  Review of the above  records today demonstrates: Sinus rhythm, poor R wave progression  ASSESSMENT & PLAN:    1.  Typical atrial flutter: Status post ablation 08/14/2019.  Patient has stopped his anticoagulation.  CHA2DS2-VASc of 1.  Fortunately has had no further episodes of atrial flutter.  We Keilan Nichol continue with current management per Dr. Percival Spanish.  We Deanna Wiater see him back on an as-needed basis.   COVID 19 screen The patient denies symptoms of COVID 19 at this time.  The importance of social distancing was discussed today.  Follow-up: As needed  Current medicines are reviewed at length with the patient today.   The patient does not have concerns regarding his medicines.  The following changes were made today:  none  Labs/ tests ordered today include:  No orders of the defined types were placed in this encounter.    Patient Risk:  after full review of this patients clinical status, I feel that they are at moderate risk at this time.  Today, I have spent 7 minutes with the patient with telehealth technology discussing atrial flutter.    Signed, Cristino Degroff Meredith Leeds, MD  09/17/2019 8:57 AM     [facility] [address] [place] [address] [phone] (office) [phone] (fax)

## 2019-09-20 ENCOUNTER — Other Ambulatory Visit: Payer: Self-pay | Admitting: Psychiatry

## 2019-09-22 ENCOUNTER — Other Ambulatory Visit: Payer: Self-pay | Admitting: Neurosurgery

## 2019-09-23 NOTE — Progress Notes (Addendum)
[place], [facility] [address] [place] [address] Phone: [phone] Fax: [place], [place] [address] Phone: [phone] Fax: [phone]      Your procedure is scheduled on September 30, 2019.  Report to [facility] Main Entrance "A" at 9:45 A.M., and check in at the Admitting office.  Call this number if you have problems the morning of surgery:  [phone]  Call [phone] if you have any questions prior to your surgery date Monday-Friday 8am-4pm    Remember:  Do not eat after midnight the night before your surgery  You may drink clear liquids until 8:45 the morning of your surgery.   Clear liquids allowed are: Water, Non-Citrus Juices (without pulp), Carbonated Beverages, Clear Tea, Black Coffee Only, and Gatorade  Please complete your PRE-SURGERY ENSURE that was provided to you by 8:45 A.M.  the morning of surgery.  Please, if able, drink it in one setting. DO NOT SIP.    Take these medicines the morning of surgery with A SIP OF WATER: Lithium Carbonate Xanax - as needed  As of today, STOP taking any Aspirin (unless otherwise instructed by your surgeon), Aleve, Naproxen, Ibuprofen, Motrin, Advil, Goody's, BC's, all herbal medications, fish oil, and all vitamins.    The Morning of Surgery  Do not wear jewelry.  Do not wear lotions, powders, colognes, or deodorant    Men may shave face and neck.  Do not bring valuables to the hospital.  [facility] is not responsible for any belongings or valuables.  If you are a smoker, DO NOT Smoke 24 hours prior to surgery  If you wear a CPAP at night please bring your mask, tubing, and machine the morning of surgery   Remember that you must have someone to transport you home after your surgery, and remain with you for 24 hours if you are discharged the same  day.   Please bring cases for contacts, glasses, hearing aids, dentures or bridgework because it cannot be worn into surgery.    Leave your suitcase in the car.  After surgery it may be brought to your room.  For patients admitted to the hospital, discharge time will be determined by your treatment team.  Patients discharged the day of surgery will not be allowed to drive home.    Special instructions:   [facility]- Preparing For Surgery  Before surgery, you can play an important role. Because skin is not sterile, your skin needs to be as free of germs as possible. You can reduce the number of germs on your skin by washing with CHG (chlorahexidine gluconate) Soap before surgery.  CHG is an antiseptic cleaner which kills germs and bonds with the skin to continue killing germs even after washing.    Oral Hygiene is also important to reduce your risk of infection.  Remember - BRUSH YOUR TEETH THE MORNING OF SURGERY WITH YOUR REGULAR TOOTHPASTE  Please do not use if you have an allergy to CHG or antibacterial soaps. If your skin becomes reddened/irritated stop using the CHG.  Do not shave (including legs and underarms) for at least 48 hours prior to first CHG shower. It is OK to shave your face.  Please follow these instructions carefully.   1. Shower the NIGHT BEFORE SURGERY and the MORNING OF SURGERY with CHG Soap.   2. If you  chose to wash your hair, wash your hair first as usual with your normal shampoo.  3. After you shampoo, rinse your hair and body thoroughly to remove the shampoo.  4. Use CHG as you would any other liquid soap. You can apply CHG directly to the skin and wash gently with a scrungie or a clean washcloth.   5. Apply the CHG Soap to your body ONLY FROM THE NECK DOWN.  Do not use on open wounds or open sores. Avoid contact with your eyes, ears, mouth and genitals (private parts). Wash Face and genitals (private parts)  with your normal soap.   6. Wash thoroughly,  paying special attention to the area where your surgery will be performed.  7. Thoroughly rinse your body with warm water from the neck down.  8. DO NOT shower/wash with your normal soap after using and rinsing off the CHG Soap.  9. Pat yourself dry with a CLEAN TOWEL.  10. Wear CLEAN PAJAMAS to bed the night before surgery, wear comfortable clothes the morning of surgery  11. Place CLEAN SHEETS on your bed the night of your first shower and DO NOT SLEEP WITH PETS.    Day of Surgery:  Please shower the morning of surgery with the CHG soap Do not apply any deodorants/lotions. Please wear clean clothes to the hospital/surgery center.   Remember to brush your teeth WITH YOUR REGULAR TOOTHPASTE.   Please read over the following fact sheets that you were given.

## 2019-09-24 ENCOUNTER — Encounter (HOSPITAL_COMMUNITY): Payer: Self-pay

## 2019-09-24 ENCOUNTER — Other Ambulatory Visit: Payer: Self-pay

## 2019-09-24 ENCOUNTER — Encounter (HOSPITAL_COMMUNITY)
Admission: RE | Admit: 2019-09-24 | Discharge: 2019-09-24 | Disposition: A | Discharge status: Home / Self Care | Payer: Medicare Other | Source: Ambulatory Visit | Attending: Neurosurgery | Admitting: Neurosurgery

## 2019-09-24 DIAGNOSIS — I4892 Unspecified atrial flutter: Secondary | ICD-10-CM | POA: Diagnosis not present

## 2019-09-24 DIAGNOSIS — Z01818 Encounter for other preprocedural examination: Secondary | ICD-10-CM | POA: Insufficient documentation

## 2019-09-24 HISTORY — DX: Cardiac arrhythmia, unspecified: I49.9

## 2019-09-24 LAB — COMPREHENSIVE METABOLIC PANEL
ALT: 22 U/L (ref 0–44)
AST: 20 U/L (ref 15–41)
Albumin: 4.6 g/dL (ref 3.5–5.0)
Alkaline Phosphatase: 43 U/L (ref 38–126)
Anion gap: 9 (ref 5–15)
BUN: 14 mg/dL (ref 8–23)
CO2: 26 mmol/L (ref 22–32)
Calcium: 9.8 mg/dL (ref 8.9–10.3)
Chloride: 105 mmol/L (ref 98–111)
Creatinine, Ser: 0.81 mg/dL (ref 0.61–1.24)
GFR calc Af Amer: >60 mL/min (ref >60)
GFR calc non Af Amer: >60 mL/min (ref >60)
Glucose, Bld: 114 mg/dL — ABNORMAL HIGH (ref 70–99)
Potassium: 4.3 mmol/L (ref 3.5–5.1)
Sodium: 140 mmol/L (ref 135–145)
Total Bilirubin: 1.7 mg/dL — ABNORMAL HIGH (ref 0.3–1.2)
Total Protein: 6.8 g/dL (ref 6.5–8.1)

## 2019-09-24 LAB — URINALYSIS, ROUTINE W REFLEX MICROSCOPIC
Bilirubin Urine: NEGATIVE
Glucose, UA: NEGATIVE mg/dL
Hgb urine dipstick: NEGATIVE
Ketones, ur: NEGATIVE mg/dL
Leukocytes,Ua: NEGATIVE
Nitrite: NEGATIVE
Protein, ur: NEGATIVE mg/dL
Specific Gravity, Urine: 1.016 (ref 1.005–1.030)
pH: 6 (ref 5.0–8.0)

## 2019-09-24 LAB — CBC WITH DIFFERENTIAL/PLATELET
Abs Immature Granulocytes: 0.05 10*3/uL (ref 0.00–0.07)
Basophils Absolute: 0.1 10*3/uL (ref 0.0–0.1)
Basophils Relative: 1 %
Eosinophils Absolute: 0.4 10*3/uL (ref 0.0–0.5)
Eosinophils Relative: 5 %
HCT: 46.1 % (ref 39.0–52.0)
Hemoglobin: 15.4 g/dL (ref 13.0–17.0)
Immature Granulocytes: 1 %
Lymphocytes Relative: 22 %
Lymphs Abs: 1.5 10*3/uL (ref 0.7–4.0)
MCH: 31.1 pg (ref 26.0–34.0)
MCHC: 33.4 g/dL (ref 30.0–36.0)
MCV: 93.1 fL (ref 80.0–100.0)
Monocytes Absolute: 0.5 10*3/uL (ref 0.1–1.0)
Monocytes Relative: 8 %
Neutro Abs: 4.3 10*3/uL (ref 1.7–7.7)
Neutrophils Relative %: 63 %
Platelets: 159 10*3/uL (ref 150–400)
RBC: 4.95 MIL/uL (ref 4.22–5.81)
RDW: 12 % (ref 11.5–15.5)
WBC: 6.8 10*3/uL (ref 4.0–10.5)
nRBC: 0 % (ref 0.0–0.2)

## 2019-09-24 LAB — PROTIME-INR
INR: 1 (ref 0.8–1.2)
Prothrombin Time: 13.1 seconds (ref 11.4–15.2)

## 2019-09-24 LAB — SURGICAL PCR SCREEN
MRSA, PCR: NEGATIVE
Staphylococcus aureus: NEGATIVE

## 2019-09-24 LAB — APTT: aPTT: 29 seconds (ref 24–36)

## 2019-09-24 LAB — ABO/RH: ABO/RH(D): A POS

## 2019-09-24 NOTE — Progress Notes (Signed)
PCP:  Dr. Crist Infante Cardiologist:  Dr. Curt Bears  EKG:  09/15/19 CXR:  NA ECHO:  04/29/18 Stress Test:  02/12/18 Cardiac Cath:  Atrial flutter ablation on 08/14/19  Blood Thinners:  Patient stopped Eliquis 12/13 and will not be going back on it.  Covid 09/28/19  Patient denies shortness of breath, fever, cough, and chest pain at PAT appointment.  Patient verbalized understanding of instructions provided today at the PAT appointment.  Patient asked to review instructions at home and day of surgery.

## 2019-09-24 NOTE — Progress Notes (Signed)
Pt called to inquire of why he was getting messages from his my chart account regarding lab results from his pre-op appt today, because they were ordered under Dr. Lynann Bologna. Dr. Lynann Bologna will not be performing his surgery, and he wanted to be sure that his surgery for Dr. Arnoldo Morale will still happen on Dec. 30th. Pt was also given incorrect pre-op instructions regarding when to stop eating and drinking, since Dr. Laurena Bering orders had an ERAS protocol entailed. Pt advised not eat or drink after midnight, but he could drink it at a later time, just not the day of surgery.

## 2019-09-28 ENCOUNTER — Telehealth: Payer: Self-pay | Admitting: Psychiatry

## 2019-09-28 ENCOUNTER — Other Ambulatory Visit: Payer: Self-pay | Admitting: Psychiatry

## 2019-09-28 ENCOUNTER — Other Ambulatory Visit: Payer: Self-pay

## 2019-09-28 ENCOUNTER — Other Ambulatory Visit (HOSPITAL_COMMUNITY)
Admission: RE | Admit: 2019-09-28 | Discharge: 2019-09-28 | Disposition: A | Discharge status: Home / Self Care | Payer: Medicare Other | Source: Ambulatory Visit | Attending: Neurosurgery | Admitting: Neurosurgery

## 2019-09-28 LAB — TYPE AND SCREEN
ABO/RH(D): A POS
Antibody Screen: NEGATIVE

## 2019-09-28 LAB — SARS CORONAVIRUS 2 (TAT 6-24 HRS): SARS Coronavirus 2: NEGATIVE

## 2019-09-28 MED ORDER — LITHIUM CARBONATE ER 450 MG PO TBCR: 450.0000 mg | EXTENDED_RELEASE_TABLET | Freq: Every day | ORAL | 1 refills | Status: DC

## 2019-09-28 NOTE — Anesthesia Preprocedure Evaluation (Addendum)
Anesthesia Evaluation  Patient identified by MRN, date of birth, ID band Patient awake    Reviewed: Allergy & Precautions, NPO status , Patient's Chart, lab work & pertinent test results  Airway Mallampati: II  TM Distance: >3 FB Neck ROM: Full    Dental no notable dental hx.    Pulmonary neg pulmonary ROS,    Pulmonary exam normal breath sounds clear to auscultation       Cardiovascular Normal cardiovascular exam+ dysrhythmias  Rhythm:Regular Rate:Normal     Neuro/Psych PSYCHIATRIC DISORDERS Depression negative neurological ROS     GI/Hepatic negative GI ROS, Neg liver ROS,   Endo/Other  negative endocrine ROS  Renal/GU negative Renal ROS     Musculoskeletal negative musculoskeletal ROS (+)   Abdominal   Peds  Hematology negative hematology ROS (+)   Anesthesia Other Findings   Reproductive/Obstetrics                                                              Anesthesia Evaluation  Patient identified by MRN, date of birth, ID band Patient awake    Reviewed: Allergy & Precautions, NPO status , Patient's Chart, lab work & pertinent test results  History of Anesthesia Complications Negative for: history of anesthetic complications  Airway Mallampati: II  TM Distance: >3 FB Neck ROM: Full    Dental no notable dental hx. (+) Dental Advisory Given, Teeth Intact,    Pulmonary neg pulmonary ROS,    Pulmonary exam normal        Cardiovascular + dysrhythmias Atrial Fibrillation  Rhythm:Regular Rate:Normal     Neuro/Psych PSYCHIATRIC DISORDERS Depression negative neurological ROS     GI/Hepatic negative GI ROS, Neg liver ROS,   Endo/Other  negative endocrine ROS  Renal/GU negative Renal ROS     Musculoskeletal negative musculoskeletal ROS (+)   Abdominal   Peds  Hematology negative hematology ROS (+)   Anesthesia Other Findings Day of surgery  medications reviewed with the patient.  Reproductive/Obstetrics                           Anesthesia Physical Anesthesia Plan  ASA: II  Anesthesia Plan: General   Post-op Pain Management:    Induction: Intravenous  PONV Risk Score and Plan: Ondansetron and Dexamethasone  Airway Management Planned: Oral ETT  Additional Equipment:   Intra-op Plan:   Post-operative Plan: Extubation in OR  Informed Consent: I have reviewed the patients History and Physical, chart, labs and discussed the procedure including the risks, benefits and alternatives for the proposed anesthesia with the patient or authorized representative who has indicated his/her understanding and acceptance.     Dental advisory given  Plan Discussed with: CRNA and Anesthesiologist  Anesthesia Plan Comments:        Anesthesia Quick Evaluation  Anesthesia Physical Anesthesia Plan  ASA: II  Anesthesia Plan: General   Post-op Pain Management:    Induction: Intravenous  PONV Risk Score and Plan:   Airway Management Planned: Oral ETT  Additional Equipment: None  Intra-op Plan:   Post-operative Plan: Extubation in OR  Informed Consent: I have reviewed the patients History and Physical, chart, labs and discussed the procedure including the risks, benefits and alternatives for the proposed anesthesia with the patient or  authorized representative who has indicated his/her understanding and acceptance.     Dental advisory given  Plan Discussed with: CRNA  Anesthesia Plan Comments: (Follows with cardiology for hx of aflutter s/p successful ablation 08/14/19. Last seen by Dr. Curt Bears 09/17/19 and per his note the pt had not had any recurrence of aflutter and continues to exercise without issue. He no longer requires anticoagulation. Pt was cleared for surgery on 12/15 by Dr. Percival Spanish, "He is to have lumbar fusion.  He is at acceptable risk for the planned procedure.  No further  testing is suggested."  Preop labs reviewed, unremarkable.   EKG 09/15/19: Sinus bradycardia with first degree AV block with PACs. Rate 53. LAD.   TTE 04/30/19: - Left ventricle: The cavity size was normal. Systolic function was   normal. The estimated ejection fraction was in the range of 55%   to 60%. Wall motion was normal; there were no regional wall   motion abnormalities. Left ventricular diastolic function   parameters were normal. - Aortic valve: There was trivial regurgitation. - Mitral valve: There was mild regurgitation. - Right ventricle: The cavity size was mildly dilated. Wall   thickness was normal. - Tricuspid valve: There was mild regurgitation. - Pulmonic valve: There was mild regurgitation.  Impressions: - Normal LVF with mild MR, mild TR and mild PR. Trivial AR. Mildly dilated RV. PASP 7mmHg.)       Anesthesia Quick Evaluation

## 2019-09-28 NOTE — Telephone Encounter (Signed)
Patient called and said that he needs [place] to authorize a refill of his lithium 450 mg to be sent to [facility]

## 2019-09-28 NOTE — Progress Notes (Signed)
Anesthesia Chart Review:  Follows with cardiology for hx of aflutter s/p successful ablation 08/14/19. Last seen by Dr. Curt Bears 09/17/19 and per his note the pt had not had any recurrence of aflutter and continues to exercise without issue. He no longer requires anticoagulation. Pt was cleared for surgery on 12/15 by Dr. Percival Spanish, "He is to have lumbar fusion.  He is at acceptable risk for the planned procedure.  No further testing is suggested."  Preop labs reviewed, unremarkable.   EKG 09/15/19: Sinus bradycardia with first degree AV block with PACs. Rate 53. LAD.   TTE 04/30/19: - Left ventricle: The cavity size was normal. Systolic function was   normal. The estimated ejection fraction was in the range of 55%   to 60%. Wall motion was normal; there were no regional wall   motion abnormalities. Left ventricular diastolic function   parameters were normal. - Aortic valve: There was trivial regurgitation. - Mitral valve: There was mild regurgitation. - Right ventricle: The cavity size was mildly dilated. Wall   thickness was normal. - Tricuspid valve: There was mild regurgitation. - Pulmonic valve: There was mild regurgitation.  Impressions:  - Normal LVF with mild MR, mild TR and mild PR. Trivial AR. Mildly   dilated RV. PASP 46mmHg.  Wynonia Musty [facility] Short Stay Center/Anesthesiology Phone [phone] 09/28/2019 9:28 AM

## 2019-09-28 NOTE — Telephone Encounter (Signed)
90 day with 1 refill submitted for Lithium 450 mg to [facility]

## 2019-09-29 NOTE — Telephone Encounter (Signed)
Looks like he's having surgery tomorrow

## 2019-09-30 ENCOUNTER — Inpatient Hospital Stay (HOSPITAL_COMMUNITY)
Admission: RE | Disposition: A | Discharge status: Home / Self Care | Payer: Self-pay | Source: Home / Self Care | Attending: Neurosurgery

## 2019-09-30 ENCOUNTER — Inpatient Hospital Stay (HOSPITAL_COMMUNITY): Payer: Medicare Other

## 2019-09-30 ENCOUNTER — Inpatient Hospital Stay (HOSPITAL_COMMUNITY)
Admission: RE | Admit: 2019-09-30 | Discharge: 2019-10-01 | DRG: 455 | Disposition: A | Discharge status: Home / Self Care | Payer: Medicare Other | Attending: Neurosurgery | Admitting: Neurosurgery

## 2019-09-30 ENCOUNTER — Other Ambulatory Visit: Payer: Self-pay

## 2019-09-30 ENCOUNTER — Inpatient Hospital Stay (HOSPITAL_COMMUNITY): Payer: Medicare Other | Admitting: Physician Assistant

## 2019-09-30 ENCOUNTER — Inpatient Hospital Stay (HOSPITAL_COMMUNITY): Payer: Medicare Other | Admitting: Anesthesiology

## 2019-09-30 ENCOUNTER — Encounter (HOSPITAL_COMMUNITY): Payer: Self-pay | Admitting: Neurosurgery

## 2019-09-30 DIAGNOSIS — I483 Typical atrial flutter: Secondary | ICD-10-CM

## 2019-09-30 DIAGNOSIS — Z7901 Long term (current) use of anticoagulants: Secondary | ICD-10-CM

## 2019-09-30 DIAGNOSIS — I480 Paroxysmal atrial fibrillation: Secondary | ICD-10-CM | POA: Diagnosis present

## 2019-09-30 DIAGNOSIS — Z8249 Family history of ischemic heart disease and other diseases of the circulatory system: Secondary | ICD-10-CM | POA: Diagnosis not present

## 2019-09-30 DIAGNOSIS — F329 Major depressive disorder, single episode, unspecified: Secondary | ICD-10-CM | POA: Diagnosis present

## 2019-09-30 DIAGNOSIS — Z20828 Contact with and (suspected) exposure to other viral communicable diseases: Secondary | ICD-10-CM | POA: Diagnosis present

## 2019-09-30 DIAGNOSIS — Z79899 Other long term (current) drug therapy: Secondary | ICD-10-CM | POA: Diagnosis not present

## 2019-09-30 DIAGNOSIS — M4316 Spondylolisthesis, lumbar region: Principal | ICD-10-CM | POA: Diagnosis present

## 2019-09-30 DIAGNOSIS — M479 Spondylosis, unspecified: Secondary | ICD-10-CM | POA: Diagnosis present

## 2019-09-30 DIAGNOSIS — M48061 Spinal stenosis, lumbar region without neurogenic claudication: Secondary | ICD-10-CM | POA: Diagnosis present

## 2019-09-30 DIAGNOSIS — Z419 Encounter for procedure for purposes other than remedying health state, unspecified: Secondary | ICD-10-CM

## 2019-09-30 DIAGNOSIS — M5416 Radiculopathy, lumbar region: Secondary | ICD-10-CM | POA: Diagnosis present

## 2019-09-30 SURGERY — POSTERIOR LUMBAR FUSION 1 LEVEL
Anesthesia: General

## 2019-09-30 MED ORDER — MIDAZOLAM HCL 2 MG/2ML IJ SOLN
INTRAMUSCULAR | Status: DC | PRN
  Administered 2019-09-30: 2 mg via INTRAVENOUS

## 2019-09-30 MED ORDER — BACITRACIN ZINC 500 UNIT/GM EX OINT
TOPICAL_OINTMENT | CUTANEOUS | Status: DC | PRN
  Administered 2019-09-30: 1 via TOPICAL

## 2019-09-30 MED ORDER — OXYCODONE HCL 5 MG PO TABS: 5.0000 mg | ORAL_TABLET | ORAL | Status: DC | PRN

## 2019-09-30 MED ORDER — BUPIVACAINE-EPINEPHRINE (PF) 0.25% -1:200000 IJ SOLN
INTRAMUSCULAR | Status: DC | PRN
  Administered 2019-09-30: 10 mL

## 2019-09-30 MED ORDER — HYDROMORPHONE HCL 1 MG/ML IJ SOLN
INTRAMUSCULAR | Status: AC
  Filled 2019-09-30: qty 1

## 2019-09-30 MED ORDER — MEPERIDINE HCL 25 MG/ML IJ SOLN: 6.2500 mg | INTRAMUSCULAR | Status: DC | PRN

## 2019-09-30 MED ORDER — LITHIUM CARBONATE ER 450 MG PO TBCR
450.0000 mg | EXTENDED_RELEASE_TABLET | Freq: Every day | ORAL | Status: DC
  Administered 2019-09-30: 450 mg via ORAL
  Filled 2019-09-30 (×3): qty 1

## 2019-09-30 MED ORDER — CEFAZOLIN SODIUM-DEXTROSE 2-4 GM/100ML-% IV SOLN
2.0000 g | Freq: Three times a day (TID) | INTRAVENOUS | Status: AC
  Administered 2019-09-30 – 2019-10-01 (×2): 2 g via INTRAVENOUS
  Filled 2019-09-30 (×2): qty 100

## 2019-09-30 MED ORDER — ONDANSETRON HCL 4 MG PO TABS: 4.0000 mg | ORAL_TABLET | Freq: Four times a day (QID) | ORAL | Status: DC | PRN

## 2019-09-30 MED ORDER — ONDANSETRON HCL 4 MG/2ML IJ SOLN
INTRAMUSCULAR | Status: DC | PRN
  Administered 2019-09-30: 4 mg via INTRAVENOUS

## 2019-09-30 MED ORDER — BACITRACIN ZINC 500 UNIT/GM EX OINT
TOPICAL_OINTMENT | CUTANEOUS | Status: AC
  Filled 2019-09-30: qty 28.35

## 2019-09-30 MED ORDER — CHLORHEXIDINE GLUCONATE CLOTH 2 % EX PADS: 6.0000 | MEDICATED_PAD | Freq: Once | CUTANEOUS | Status: DC

## 2019-09-30 MED ORDER — MIDAZOLAM HCL 2 MG/2ML IJ SOLN
INTRAMUSCULAR | Status: AC
  Filled 2019-09-30: qty 2

## 2019-09-30 MED ORDER — SODIUM CHLORIDE 0.9% FLUSH
3.0000 mL | Freq: Two times a day (BID) | INTRAVENOUS | Status: DC
  Administered 2019-09-30: 20:00:00 3 mL via INTRAVENOUS

## 2019-09-30 MED ORDER — PROMETHAZINE HCL 25 MG/ML IJ SOLN
6.2500 mg | INTRAMUSCULAR | Status: DC | PRN
  Administered 2019-09-30: 17:00:00 6.25 mg via INTRAVENOUS

## 2019-09-30 MED ORDER — DEXAMETHASONE SODIUM PHOSPHATE 10 MG/ML IJ SOLN
INTRAMUSCULAR | Status: DC | PRN
  Administered 2019-09-30: 10 mg via INTRAVENOUS

## 2019-09-30 MED ORDER — MORPHINE SULFATE (PF) 4 MG/ML IV SOLN: 4.0000 mg | INTRAVENOUS | Status: DC | PRN

## 2019-09-30 MED ORDER — BUPIVACAINE LIPOSOME 1.3 % IJ SUSP
INTRAMUSCULAR | Status: DC | PRN
  Administered 2019-09-30: 20 mL

## 2019-09-30 MED ORDER — THROMBIN 5000 UNITS EX SOLR
CUTANEOUS | Status: AC
  Filled 2019-09-30: qty 5000

## 2019-09-30 MED ORDER — PHENOL 1.4 % MT LIQD: 1.0000 | OROMUCOSAL | Status: DC | PRN

## 2019-09-30 MED ORDER — ACETAMINOPHEN 650 MG RE SUPP: 650.0000 mg | RECTAL | Status: DC | PRN

## 2019-09-30 MED ORDER — FENTANYL CITRATE (PF) 250 MCG/5ML IJ SOLN
INTRAMUSCULAR | Status: AC
  Filled 2019-09-30: qty 5

## 2019-09-30 MED ORDER — HYDROMORPHONE HCL 1 MG/ML IJ SOLN
0.2500 mg | INTRAMUSCULAR | Status: DC | PRN
  Administered 2019-09-30: 0.5 mg via INTRAVENOUS

## 2019-09-30 MED ORDER — LACTATED RINGERS IV SOLN: INTRAVENOUS | Status: DC

## 2019-09-30 MED ORDER — LITHIUM CARBONATE ER 450 MG PO TBCR: 450.0000 mg | EXTENDED_RELEASE_TABLET | Freq: Every day | ORAL | Status: DC

## 2019-09-30 MED ORDER — PROPOFOL 10 MG/ML IV BOLUS
INTRAVENOUS | Status: AC
  Filled 2019-09-30: qty 20

## 2019-09-30 MED ORDER — CYCLOBENZAPRINE HCL 10 MG PO TABS
10.0000 mg | ORAL_TABLET | Freq: Three times a day (TID) | ORAL | Status: DC | PRN
  Administered 2019-09-30: 10 mg via ORAL
  Filled 2019-09-30: qty 1

## 2019-09-30 MED ORDER — PROPOFOL 10 MG/ML IV BOLUS
INTRAVENOUS | Status: DC | PRN
  Administered 2019-09-30: 140 mg via INTRAVENOUS

## 2019-09-30 MED ORDER — ROCURONIUM BROMIDE 10 MG/ML (PF) SYRINGE
PREFILLED_SYRINGE | INTRAVENOUS | Status: DC | PRN
  Administered 2019-09-30: 50 mg via INTRAVENOUS
  Administered 2019-09-30: 10 mg via INTRAVENOUS
  Administered 2019-09-30 (×2): 20 mg via INTRAVENOUS

## 2019-09-30 MED ORDER — 0.9 % SODIUM CHLORIDE (POUR BTL) OPTIME
TOPICAL | Status: DC | PRN
  Administered 2019-09-30: 1000 mL

## 2019-09-30 MED ORDER — PROMETHAZINE HCL 25 MG/ML IJ SOLN
INTRAMUSCULAR | Status: AC
  Filled 2019-09-30: qty 1

## 2019-09-30 MED ORDER — ACETAMINOPHEN 500 MG PO TABS
1000.0000 mg | ORAL_TABLET | Freq: Four times a day (QID) | ORAL | Status: DC
  Administered 2019-09-30 – 2019-10-01 (×3): 1000 mg via ORAL
  Filled 2019-09-30 (×3): qty 2

## 2019-09-30 MED ORDER — CEFAZOLIN SODIUM-DEXTROSE 2-4 GM/100ML-% IV SOLN
INTRAVENOUS | Status: AC
  Filled 2019-09-30: qty 100

## 2019-09-30 MED ORDER — ONDANSETRON HCL 4 MG/2ML IJ SOLN: 4.0000 mg | Freq: Four times a day (QID) | INTRAMUSCULAR | Status: DC | PRN

## 2019-09-30 MED ORDER — BUPIVACAINE LIPOSOME 1.3 % IJ SUSP
20.0000 mL | Freq: Once | INTRAMUSCULAR | Status: DC
  Filled 2019-09-30: qty 20

## 2019-09-30 MED ORDER — THROMBIN 5000 UNITS EX SOLR
OROMUCOSAL | Status: DC | PRN
  Administered 2019-09-30 (×2): 5 mL via TOPICAL

## 2019-09-30 MED ORDER — DOCUSATE SODIUM 100 MG PO CAPS
100.0000 mg | ORAL_CAPSULE | Freq: Two times a day (BID) | ORAL | Status: DC
  Administered 2019-09-30: 100 mg via ORAL
  Filled 2019-09-30: qty 1

## 2019-09-30 MED ORDER — LIDOCAINE 2% (20 MG/ML) 5 ML SYRINGE
INTRAMUSCULAR | Status: DC | PRN
  Administered 2019-09-30: 100 mg via INTRAVENOUS

## 2019-09-30 MED ORDER — OXYCODONE HCL 5 MG PO TABS
10.0000 mg | ORAL_TABLET | ORAL | Status: DC | PRN
  Administered 2019-09-30 (×2): 10 mg via ORAL
  Filled 2019-09-30 (×3): qty 2

## 2019-09-30 MED ORDER — SODIUM CHLORIDE 0.9 % IV SOLN
250.0000 mL | INTRAVENOUS | Status: DC
  Administered 2019-09-30: 250 mL via INTRAVENOUS

## 2019-09-30 MED ORDER — ACETAMINOPHEN 325 MG PO TABS: 650.0000 mg | ORAL_TABLET | ORAL | Status: DC | PRN

## 2019-09-30 MED ORDER — FENTANYL CITRATE (PF) 250 MCG/5ML IJ SOLN
INTRAMUSCULAR | Status: DC | PRN
  Administered 2019-09-30 (×3): 50 ug via INTRAVENOUS
  Administered 2019-09-30: 100 ug via INTRAVENOUS

## 2019-09-30 MED ORDER — BISACODYL 10 MG RE SUPP: 10.0000 mg | Freq: Every day | RECTAL | Status: DC | PRN

## 2019-09-30 MED ORDER — SUGAMMADEX SODIUM 200 MG/2ML IV SOLN
INTRAVENOUS | Status: DC | PRN
  Administered 2019-09-30: 200 mg via INTRAVENOUS

## 2019-09-30 MED ORDER — LITHIUM CARBONATE 300 MG PO CAPS: 300.0000 mg | ORAL_CAPSULE | Freq: Every day | ORAL | Status: DC

## 2019-09-30 MED ORDER — SODIUM CHLORIDE 0.9% FLUSH: 3.0000 mL | INTRAVENOUS | Status: DC | PRN

## 2019-09-30 MED ORDER — CEFAZOLIN SODIUM-DEXTROSE 2-4 GM/100ML-% IV SOLN
2.0000 g | INTRAVENOUS | Status: AC
  Administered 2019-09-30: 2 g via INTRAVENOUS

## 2019-09-30 MED ORDER — DEXAMETHASONE SODIUM PHOSPHATE 10 MG/ML IJ SOLN
INTRAMUSCULAR | Status: AC
  Filled 2019-09-30: qty 1

## 2019-09-30 MED ORDER — LITHIUM CARBONATE 300 MG PO CAPS
300.0000 mg | ORAL_CAPSULE | Freq: Every day | ORAL | Status: DC
  Administered 2019-09-30: 300 mg via ORAL
  Filled 2019-09-30 (×3): qty 1

## 2019-09-30 MED ORDER — MENTHOL 3 MG MT LOZG: 1.0000 | LOZENGE | OROMUCOSAL | Status: DC | PRN

## 2019-09-30 MED ORDER — HYDROXYZINE HCL 50 MG/ML IM SOLN
50.0000 mg | Freq: Four times a day (QID) | INTRAMUSCULAR | Status: DC | PRN
  Administered 2019-09-30: 50 mg via INTRAMUSCULAR
  Filled 2019-09-30: qty 1

## 2019-09-30 MED ORDER — SODIUM CHLORIDE 0.9 % IV SOLN
INTRAVENOUS | Status: DC | PRN
  Administered 2019-09-30: 14:00:00 500 mL

## 2019-09-30 SURGICAL SUPPLY — 65 items
BAG DECANTER FOR FLEXI CONT (MISCELLANEOUS) ×2 IMPLANT
BENZOIN TINCTURE PRP APPL 2/3 (GAUZE/BANDAGES/DRESSINGS) ×2 IMPLANT
BLADE CLIPPER SURG (BLADE) IMPLANT
BUR MATCHSTICK NEURO 3.0 LAGG (BURR) ×2 IMPLANT
BUR PRECISION FLUTE 6.0 (BURR) ×2 IMPLANT
CANISTER SUCT 3000ML PPV (MISCELLANEOUS) ×2 IMPLANT
CAP LOCK DLX THRD (Cap) ×8 IMPLANT
CARTRIDGE OIL MAESTRO DRILL (MISCELLANEOUS) ×1 IMPLANT
CONT SPEC 4OZ CLIKSEAL STRL BL (MISCELLANEOUS) ×2 IMPLANT
COVER BACK TABLE 60X90IN (DRAPES) ×2 IMPLANT
COVER WAND RF STERILE (DRAPES) IMPLANT
DECANTER SPIKE VIAL GLASS SM (MISCELLANEOUS) ×2 IMPLANT
DIFFUSER DRILL AIR PNEUMATIC (MISCELLANEOUS) ×2 IMPLANT
DRAPE C-ARM 42X72 X-RAY (DRAPES) ×4 IMPLANT
DRAPE HALF SHEET 40X57 (DRAPES) ×2 IMPLANT
DRAPE LAPAROTOMY 100X72X124 (DRAPES) ×2 IMPLANT
DRAPE SURG 17X23 STRL (DRAPES) ×8 IMPLANT
DRSG OPSITE POSTOP 4X8 (GAUZE/BANDAGES/DRESSINGS) ×2 IMPLANT
ELECT BLADE 4.0 EZ CLEAN MEGAD (MISCELLANEOUS) ×2
ELECT REM PT RETURN 9FT ADLT (ELECTROSURGICAL) ×2
ELECTRODE BLDE 4.0 EZ CLN MEGD (MISCELLANEOUS) ×1 IMPLANT
ELECTRODE REM PT RTRN 9FT ADLT (ELECTROSURGICAL) ×1 IMPLANT
EVACUATOR 1/8 PVC DRAIN (DRAIN) IMPLANT
GAUZE 4X4 16PLY RFD (DISPOSABLE) ×2 IMPLANT
GAUZE SPONGE 4X4 12PLY STRL (GAUZE/BANDAGES/DRESSINGS) ×2 IMPLANT
GLOVE BIO SURGEON STRL SZ8 (GLOVE) ×4 IMPLANT
GLOVE BIO SURGEON STRL SZ8.5 (GLOVE) ×4 IMPLANT
GLOVE EXAM NITRILE XL STR (GLOVE) IMPLANT
GOWN STRL REUS W/ TWL LRG LVL3 (GOWN DISPOSABLE) IMPLANT
GOWN STRL REUS W/ TWL XL LVL3 (GOWN DISPOSABLE) ×2 IMPLANT
GOWN STRL REUS W/TWL 2XL LVL3 (GOWN DISPOSABLE) IMPLANT
GOWN STRL REUS W/TWL LRG LVL3 (GOWN DISPOSABLE)
GOWN STRL REUS W/TWL XL LVL3 (GOWN DISPOSABLE) ×2
HEMOSTAT POWDER KIT SURGIFOAM (HEMOSTASIS) ×4 IMPLANT
KIT BASIN OR (CUSTOM PROCEDURE TRAY) ×2 IMPLANT
KIT TURNOVER KIT B (KITS) ×2 IMPLANT
MILL MEDIUM DISP (BLADE) IMPLANT
NEEDLE HYPO 21X1.5 SAFETY (NEEDLE) ×2 IMPLANT
NEEDLE HYPO 22GX1.5 SAFETY (NEEDLE) ×2 IMPLANT
NS IRRIG 1000ML POUR BTL (IV SOLUTION) ×2 IMPLANT
OIL CARTRIDGE MAESTRO DRILL (MISCELLANEOUS) ×2
PACK LAMINECTOMY NEURO (CUSTOM PROCEDURE TRAY) ×2 IMPLANT
PAD ARMBOARD 7.5X6 YLW CONV (MISCELLANEOUS) ×6 IMPLANT
PATTIES SURGICAL .5 X.5 (GAUZE/BANDAGES/DRESSINGS) ×2 IMPLANT
PATTIES SURGICAL .5 X1 (DISPOSABLE) IMPLANT
PATTIES SURGICAL 1X1 (DISPOSABLE) ×2 IMPLANT
PUTTY DBM 10CC CALC GRAN (Putty) ×2 IMPLANT
ROD CREO DLX CVD 6.35X40 (Rod) ×1 IMPLANT
ROD CURVED TI 6.35X40 (Rod) ×1 IMPLANT
ROD CURVED TI 6.35X45 (Rod) ×2 IMPLANT
SCREW PA DLX CREO 7.5X50 (Screw) ×4 IMPLANT
SCREW PA DLX CREO 7.5X55 (Screw) ×4 IMPLANT
SPACER ALTERA 10X31-15 (Spacer) ×2 IMPLANT
SPONGE LAP 4X18 RFD (DISPOSABLE) IMPLANT
SPONGE NEURO XRAY DETECT 1X3 (DISPOSABLE) IMPLANT
SPONGE SURGIFOAM ABS GEL 100 (HEMOSTASIS) IMPLANT
STRIP CLOSURE SKIN 1/2X4 (GAUZE/BANDAGES/DRESSINGS) ×2 IMPLANT
SUT VIC AB 1 CT1 18XBRD ANBCTR (SUTURE) ×2 IMPLANT
SUT VIC AB 1 CT1 8-18 (SUTURE) ×2
SUT VIC AB 2-0 CP2 18 (SUTURE) ×4 IMPLANT
SYR 20ML LL LF (SYRINGE) IMPLANT
TOWEL GREEN STERILE (TOWEL DISPOSABLE) ×2 IMPLANT
TOWEL GREEN STERILE FF (TOWEL DISPOSABLE) ×2 IMPLANT
TRAY FOLEY MTR SLVR 16FR STAT (SET/KITS/TRAYS/PACK) ×2 IMPLANT
WATER STERILE IRR 1000ML POUR (IV SOLUTION) ×2 IMPLANT

## 2019-09-30 NOTE — Op Note (Signed)
Brief history: The patient is a 68 year old male who has complained of back and bilateral leg pain consistent with neurogenic claudication.  He has failed medical management and was worked up with lumbar x-rays and lumbar MRI which demonstrated an L4-5 spondylolisthesis and spinal stenosis.  I discussed the various treatment options with him.  He has weighed the risks, benefits and alternatives of surgery and decided proceed with an L4-5 decompression, instrumentation and fusion.  Preoperative diagnosis: L4-5 spondylolisthesis, facet arthropathy, spinal stenosis compressing both the L4 and the L5 nerve roots; lumbago; lumbar radiculopathy; neurogenic claudication  Postoperative diagnosis: The same  Procedure: Bilateral L4-5 laminotomy/foraminotomies/medial facetectomy to decompress the bilateral L4 and L5 nerve roots(the work required to do this was in addition to the work required to do the posterior lumbar interbody fusion because of the patient's spinal stenosis, facet arthropathy. Etc. requiring a wide decompression of the nerve roots.);  L4-5 transforaminal lumbar interbody fusion with local morselized autograft bone and Zimmer DBM; insertion of interbody prosthesis at L4-5 (globus peek expandable interbody prosthesis); posterior nonsegmental instrumentation from L4 to L5 with globus titanium pedicle screws and rods; posterior lateral arthrodesis at L4-5 with local morselized autograft bone and Zimmer DBM.  Surgeon: Dr. Delma Officer  Asst.: Dr. Maeola Harman and Hildred Priest nurse practitioner  Anesthesia: Dicky Doe. endotracheal  Estimated blood loss: 250 cc  Drains: None  Complications: None  Description of procedure: The patient was brought to the operating room by the anesthesia team. General endotracheal anesthesia was induced. The patient was turned to the prone position on the Wilson frame. The patient's lumbosacral region was then prepared with Betadine scrub and Betadine solution. Sterile  drapes were applied.  I then injected the area to be incised with Marcaine with epinephrine solution. I then used the scalpel to make a linear midline incision over the L4-5 interspace. I then used electrocautery to perform a bilateral subperiosteal dissection exposing the spinous process and lamina of L4 and L5. We then obtained intraoperative radiograph to confirm our location. We then inserted the Verstrac retractor to provide exposure.  I began the decompression by using the high speed drill to perform laminotomies at L4-5 bilaterally. We then used the Kerrison punches to widen the laminotomy and removed the ligamentum flavum at L4-5 bilaterally. We used the Kerrison punches to remove the medial facets at L4-5 bilaterally. We performed wide foraminotomies about the bilateral L4 and L5 nerve roots completing the decompression.  We now turned our attention to the posterior lumbar interbody fusion. I used a scalpel to incise the intervertebral disc at L4-5 bilaterally. I then performed a partial intervertebral discectomy at L4-5 bilaterally using the pituitary forceps. We prepared the vertebral endplates at L4-5 bilaterally for the fusion by removing the soft tissues with the curettes. We then used the trial spacers to pick the appropriate sized interbody prosthesis. We prefilled his prosthesis with a combination of local morselized autograft bone that we obtained during the decompression as well as Zimmer DBM. We inserted the prefilled prosthesis into the interspace at L4-5, we then turned and expanded the prosthesis. There was a good snug fit of the prosthesis in the interspace. We then filled and the remainder of the intervertebral disc space with local morselized autograft bone and Zimmer DBM. This completed the posterior lumbar interbody arthrodesis.  During the decompression and insertion of the prosthesis the assistant protected the thecal sac and nerve roots with the D'Errico retractor.  We now  turned attention to the instrumentation. Under fluoroscopic guidance  we cannulated the bilateral L4 and L5 pedicles with the bone probe. We then removed the bone probe. We then tapped the pedicle with a 6.5 millimeter tap. We then removed the tap. We probed inside the tapped pedicle with a ball probe to rule out cortical breaches. We then inserted a 7.5 x 50 and 55 millimeter pedicle screw into the L4 and L5 pedicles bilaterally under fluoroscopic guidance. We then palpated along the medial aspect of the pedicles to rule out cortical breaches. There were none. The nerve roots were not injured. We then connected the unilateral pedicle screws with a lordotic rod. We compressed the construct and secured the rod in place with the caps. We then tightened the caps appropriately. This completed the instrumentation from L4-5 bilaterally.  We now turned our attention to the posterior lateral arthrodesis at L4-5 bilaterally. We used the high-speed drill to decorticate the remainder of the facets, pars, transverse process at L4-5 bilaterally. We then applied a combination of local morselized autograft bone and Zimmer DBM over these decorticated posterior lateral structures. This completed the posterior lateral arthrodesis.  We then obtained hemostasis using bipolar electrocautery. We irrigated the wound out with bacitracin solution. We inspected the thecal sac and nerve roots and noted they were well decompressed. We then removed the retractor.  We injected Exparel . We reapproximated patient's thoracolumbar fascia with interrupted #1 Vicryl suture. We reapproximated patient's subcutaneous tissue with interrupted 2-0 Vicryl suture. The reapproximated patient's skin with Steri-Strips and benzoin. The wound was then coated with bacitracin ointment. A sterile dressing was applied. The drapes were removed. The patient was subsequently returned to the supine position where they were extubated by the anesthesia team. He was then  transported to the post anesthesia care unit in stable condition. All sponge instrument and needle counts were reportedly correct at the end of this case.

## 2019-09-30 NOTE — Progress Notes (Signed)
Orthopedic Tech Progress Note Patient Details:  Nathaniel Bush 14-Sep-1951 [id number] Applied an LSO to patient.  Patient ID: Nathaniel Bush, male   DOB: 05-Jul-1951, 68 y.o.   MRN: [id number]   Nathaniel Bush 09/30/2019, 6:15 PM

## 2019-09-30 NOTE — Anesthesia Procedure Notes (Signed)
Procedure Name: Intubation Date/Time: 09/30/2019 12:59 PM Performed by: Janace Litten, CRNA Pre-anesthesia Checklist: Patient identified, Emergency Drugs available, Suction available and Patient being monitored Patient Re-evaluated:Patient Re-evaluated prior to induction Oxygen Delivery Method: Circle System Utilized Preoxygenation: Pre-oxygenation with 100% oxygen Induction Type: IV induction Ventilation: Mask ventilation without difficulty Laryngoscope Size: 3 Grade View: Grade I Tube type: Oral Tube size: 7.5 mm Number of attempts: 1 Airway Equipment and Method: Stylet Placement Confirmation: ETT inserted through vocal cords under direct vision,  positive ETCO2 and breath sounds checked- equal and bilateral Secured at: 22 cm Tube secured with: Tape Dental Injury: Teeth and Oropharynx as per pre-operative assessment

## 2019-09-30 NOTE — Anesthesia Postprocedure Evaluation (Signed)
Anesthesia Post Note  Patient: Nathaniel Bush  Procedure(s) Performed: POSTERIOR LUMBAR INTERBODY FUSION, INTERBODY PROSTHESIS,POSTERIOR INSTRUMENTATION LUMBAR FOUR- LUMBAR FIVE (N/A )     Patient location during evaluation: PACU Anesthesia Type: General Level of consciousness: sedated and patient cooperative Pain management: pain level controlled Vital Signs Assessment: post-procedure vital signs reviewed and stable Respiratory status: spontaneous breathing Cardiovascular status: stable Anesthetic complications: no    Last Vitals:  Vitals:   09/30/19 1924 09/30/19 2241  BP: 130/70 130/68  Pulse: 67 64  Resp: 18 18  Temp: 37 C 37 C  SpO2: 98% 99%    Last Pain:  Vitals:   09/30/19 2241  TempSrc: Oral  PainSc:                  Nolon Nations

## 2019-09-30 NOTE — Progress Notes (Signed)
Subjective: The patient is somnolent but arousable.  He is in no apparent distress.  Objective: Vital signs in last 24 hours: Temp:  [97.9 F (36.6 C)-98.4 F (36.9 C)] (P) 98.4 F (36.9 C) (12/30 1645) Pulse Rate:  [62] 62 (12/30 0939) Resp:  [17] 17 (12/30 0939) BP: (160)/(78) (P) 139/72 (12/30 1645) SpO2:  [100 %] 100 % (12/30 0939) Weight:  [72.6 kg] 72.6 kg (12/30 0939) Estimated body mass index is 22.96 kg/m as calculated from the following:   Height as of this encounter: 5\' 10"  (1.778 m).   Weight as of this encounter: 72.6 kg.   Intake/Output from previous day: No intake/output data recorded. Intake/Output this shift: Total I/O In: 2000 [I.V.:2000] Out: 190 [Urine:190]  Physical exam the patient is somnolent but arousable.  He is moving his lower extremities well.  Lab Results: No results for input(s): WBC, HGB, HCT, PLT in the last 72 hours. BMET No results for input(s): NA, K, CL, CO2, GLUCOSE, BUN, CREATININE, CALCIUM in the last 72 hours.  Studies/Results: DG Lumbar Spine 2-3 Views  Result Date: 09/30/2019 CLINICAL DATA:  Status post posterior fusion. EXAM: LUMBAR SPINE - 2-3 VIEW COMPARISON:  MRI lumbar spine dated 06/04/2019 FINDINGS: The patient has undergone reported L4-L5 posterior fusion. There is an interbody spacer at the L4-L5 level. There is severe disc height loss at the L5-S1 level. Postsurgical changes are noted. IMPRESSION: 1. Status post L4-L5 posterior fusion. No complicating features. 2. Severe disc height loss at L5-S1. Electronically Signed   By: Constance Holster M.D.   On: 09/30/2019 16:16   DG Lumbar Spine 1 View  Result Date: 09/30/2019 CLINICAL DATA:  Lumbar instrumentation EXAM: LUMBAR SPINE - 1 VIEW COMPARISON:  September 04, 2019 FINDINGS: A lateral radiograph of the lumbar spine demonstrates instrumentation at the L4-L5 disc space level. There is grade 1 anterolisthesis of L4 on L5. There is severe disc height loss at the L5-S1 level.  IMPRESSION: Instrumentation of the lumbar spine as above. Electronically Signed   By: Constance Holster M.D.   On: 09/30/2019 16:18   DG C-Arm 1-60 Min  Result Date: 09/30/2019 CLINICAL DATA:  Status post posterior fusion. EXAM: DG C-ARM 1-60 MIN FLUOROSCOPY TIME:  Fluoroscopy Time:  14 seconds Number of Acquired Spot Images: 2 COMPARISON:  MRI lumbar spine dated 06/04/2019 FINDINGS: The patient is status post posterior fusion from L4-L5. There is an interbody spacer at the L4-L5 level. Severe disc height loss is noted at the L5-S1 level. IMPRESSION: Status post posterior fusion as above. Electronically Signed   By: Constance Holster M.D.   On: 09/30/2019 16:17    Assessment/Plan: The patient is doing well.  I spoke with his wife.  LOS: 0 days     Ophelia Charter 09/30/2019, 4:51 PM

## 2019-09-30 NOTE — Transfer of Care (Signed)
Immediate Anesthesia Transfer of Care Note  Patient: Nathaniel Bush  Procedure(s) Performed: POSTERIOR LUMBAR INTERBODY FUSION, INTERBODY PROSTHESIS,POSTERIOR INSTRUMENTATION LUMBAR FOUR- LUMBAR FIVE (N/A )  Patient Location: PACU  Anesthesia Type:General  Level of Consciousness: drowsy and patient cooperative  Airway & Oxygen Therapy: Patient Spontanous Breathing  Post-op Assessment: Report given to RN and Post -op Vital signs reviewed and stable  Post vital signs: Reviewed and stable  Last Vitals:  Vitals Value Taken Time  BP 139/72 09/30/19 1639  Temp    Pulse 71 09/30/19 1641  Resp 22 09/30/19 1641  SpO2 97 % 09/30/19 1641  Vitals shown include unvalidated device data.  Last Pain:  Vitals:   09/30/19 0950  TempSrc:   PainSc: 0-No pain         Complications: No apparent anesthesia complications

## 2019-09-30 NOTE — H&P (Signed)
Subjective: The patient is a 68 year old male who has complained of back and bilateral leg pain consistent with neurogenic claudication.  He has failed medical management and was worked up with lumbar x-rays and a lumbar MRI which demonstrated a L4-5 spondylolisthesis and spinal stenosis.  I discussed the various treatment options with him.  He has decided to proceed with surgery.  Past Medical History:  Diagnosis Date  . Depression   . Dysrhythmia    aflutter  . PAF (paroxysmal atrial fibrillation) ([place])   . Palpitations     Past Surgical History:  Procedure Laterality Date  . A-FLUTTER ABLATION N/A 08/14/2019   Procedure: A-FLUTTER ABLATION;  Surgeon: Constance Haw, MD;  Location: [place] [facility];  Service: Cardiovascular;  Laterality: N/A;  . INGUINAL HERNIA REPAIR    . TOE SURGERY      No Known Allergies  Social History   Tobacco Use  . Smoking status: Never Smoker  . Smokeless tobacco: Never Used  Substance Use Topics  . Alcohol use: Yes    Alcohol/week: 3.0 standard drinks    Types: 2 Glasses of wine, 1 Cans of beer per week    Comment: daily    Family History  Problem Relation Age of Onset  . Atrial fibrillation Father        pacemaker  . Heart disease Paternal Grandmother        pacemaker   Prior to Admission medications   Medication Sig Start Date End Date Taking? Authorizing Provider  ALPRAZolam (XANAX) 0.25 MG tablet Take 1 tablet (0.25 mg total) by mouth daily as needed for anxiety. 02/24/19  Yes Cottle, Billey Co., MD  lithium carbonate (ESKALITH) 450 MG CR tablet Take 1 tablet (450 mg total) by mouth daily. 09/28/19  Yes Cottle, Billey Co., MD  lithium carbonate 300 MG capsule TAKE 1 CAPSULE BY MOUTH  DAILY 09/20/19  Yes Cottle, Billey Co., MD  apixaban (ELIQUIS) 5 MG TABS tablet Take 1 tablet (5 mg total) by mouth 2 (two) times daily. Patient not taking: Reported on 09/16/2019 07/07/19   Erlene Quan, PA-C     Review of Systems  Positive  ROS: As above  All other systems have been reviewed and were otherwise negative with the exception of those mentioned in the HPI and as above.  Objective: Vital signs in last 24 hours: Temp:  [97.9 F (36.6 C)] 97.9 F (36.6 C) (12/30 0939) Pulse Rate:  [62] 62 (12/30 0939) Resp:  [17] 17 (12/30 0939) BP: (160)/(78) 160/78 (12/30 0939) SpO2:  [100 %] 100 % (12/30 0939) Weight:  [72.6 kg] 72.6 kg (12/30 0939) Estimated body mass index is 22.96 kg/m as calculated from the following:   Height as of this encounter: 5\' 10"  (1.778 m).   Weight as of this encounter: 72.6 kg.   General Appearance: Alert Head: Normocephalic, without obvious abnormality, atraumatic Eyes: PERRL, conjunctiva/corneas clear, EOM's intact,    Ears: Normal  Throat: Normal  Neck: Supple, Back: unremarkable Lungs: Clear to auscultation bilaterally, respirations unlabored Heart: Regular rate and rhythm, no murmur, rub or gallop Abdomen: Soft, non-tender Extremities: Extremities normal, atraumatic, no cyanosis or edema Skin: unremarkable  NEUROLOGIC:   Mental status: alert and oriented,Motor Exam - grossly normal Sensory Exam - grossly normal Reflexes:  Coordination - grossly normal Gait - grossly normal Balance - grossly normal Cranial Nerves: I: smell Not tested  II: visual acuity  OS: Normal  OD: Normal   II: visual fields Full  to confrontation  II: pupils Equal, round, reactive to light  III,VII: ptosis None  III,IV,VI: extraocular muscles  Full ROM  V: mastication Normal  V: facial light touch sensation  Normal  V,VII: corneal reflex  Present  VII: facial muscle function - upper  Normal  VII: facial muscle function - lower Normal  VIII: hearing Not tested  IX: soft palate elevation  Normal  IX,X: gag reflex Present  XI: trapezius strength  5/5  XI: sternocleidomastoid strength 5/5  XI: neck flexion strength  5/5  XII: tongue strength  Normal    Data Review Lab Results  Component Value  Date   WBC 6.8 09/24/2019   HGB 15.4 09/24/2019   HCT 46.1 09/24/2019   MCV 93.1 09/24/2019   PLT 159 09/24/2019   Lab Results  Component Value Date   NA 140 09/24/2019   K 4.3 09/24/2019   CL 105 09/24/2019   CO2 26 09/24/2019   BUN 14 09/24/2019   CREATININE 0.81 09/24/2019   GLUCOSE 114 (H) 09/24/2019   Lab Results  Component Value Date   INR 1.0 09/24/2019    Assessment/Plan: L4-5 spondylolisthesis, spinal stenosis, lumbago, lumbar radiculopathy, neurogenic claudication: I have discussed the situation with the patient.  I have reviewed his imaging studies with him and pointed out the abnormalities.  We have discussed the various treatment options including surgery.  I have described the surgical treatment option of an L4-5 decompression, instrumentation and fusion.  I have shown him surgical models.  I have given him a surgical pamphlet.  We have discussed the risks, benefits, alternatives, expected postop course, and likelihood of achieving our goals with surgery.  I have answered all his questions.  He has decided to proceed with surgery.   Cristi Loron 09/30/2019 11:57 AM

## 2019-10-01 LAB — CBC
HCT: 35.5 % — ABNORMAL LOW (ref 39.0–52.0)
Hemoglobin: 12.3 g/dL — ABNORMAL LOW (ref 13.0–17.0)
MCH: 31.4 pg (ref 26.0–34.0)
MCHC: 34.6 g/dL (ref 30.0–36.0)
MCV: 90.6 fL (ref 80.0–100.0)
Platelets: 133 10*3/uL — ABNORMAL LOW (ref 150–400)
RBC: 3.92 MIL/uL — ABNORMAL LOW (ref 4.22–5.81)
RDW: 11.9 % (ref 11.5–15.5)
WBC: 13.2 10*3/uL — ABNORMAL HIGH (ref 4.0–10.5)
nRBC: 0 % (ref 0.0–0.2)

## 2019-10-01 LAB — BASIC METABOLIC PANEL
Anion gap: 6 (ref 5–15)
BUN: 15 mg/dL (ref 8–23)
CO2: 26 mmol/L (ref 22–32)
Calcium: 9.2 mg/dL (ref 8.9–10.3)
Chloride: 105 mmol/L (ref 98–111)
Creatinine, Ser: 0.85 mg/dL (ref 0.61–1.24)
GFR calc Af Amer: >60 mL/min (ref >60)
GFR calc non Af Amer: >60 mL/min (ref >60)
Glucose, Bld: 162 mg/dL — ABNORMAL HIGH (ref 70–99)
Potassium: 4.3 mmol/L (ref 3.5–5.1)
Sodium: 137 mmol/L (ref 135–145)

## 2019-10-01 MED ORDER — OXYCODONE HCL 10 MG PO TABS: 10.0000 mg | ORAL_TABLET | ORAL | 0 refills | Status: DC | PRN

## 2019-10-01 MED ORDER — DOCUSATE SODIUM 100 MG PO CAPS: 100.0000 mg | ORAL_CAPSULE | Freq: Two times a day (BID) | ORAL | 0 refills | Status: DC

## 2019-10-01 MED ORDER — CYCLOBENZAPRINE HCL 10 MG PO TABS: 10.0000 mg | ORAL_TABLET | Freq: Three times a day (TID) | ORAL | 0 refills | Status: DC | PRN

## 2019-10-01 MED FILL — Thrombin For Soln 5000 Unit: CUTANEOUS | Qty: 5000 | Status: AC

## 2019-10-01 MED FILL — Gelatin Absorbable MT Powder: OROMUCOSAL | Qty: 1 | Status: CN

## 2019-10-01 NOTE — Evaluation (Signed)
Physical Therapy Evaluation and Discharge Patient Details Name: Nathaniel Bush MRN: [id number] DOB: [birth year] Today's Date: 10/01/2019   History of Present Illness  Pt is a 68 year old man admitted for L4-5 PLIF. PMH: depression, afib, aflutter s/p ablation.  Clinical Impression  Patient evaluated by Physical Therapy with no further acute PT needs identified. All education has been completed and the patient has no further questions. Pt was able to demonstrate transfers and ambulation with gross modified independence and no AD. Pt was educated on precautions, brace application/wearing schedule, appropriate activity progression, positioning recommendations and car transfer. See below for any follow-up Physical Therapy or equipment needs. PT is signing off. Thank you for this referral.     Follow Up Recommendations No PT follow up;Supervision - Intermittent    Equipment Recommendations  None recommended by PT    Recommendations for Other Services       Precautions / Restrictions Precautions Precautions: Back Precaution Booklet Issued: Yes (comment) Precaution Comments: educated in back precautions and reinforced with written handout Required Braces or Orthoses: Spinal Brace Spinal Brace: Lumbar corset;Applied in sitting position Restrictions Weight Bearing Restrictions: No      Mobility  Bed Mobility Overal bed mobility: Needs Assistance Bed Mobility: Rolling;Sidelying to Sit Rolling: Supervision Sidelying to sit: Supervision       General bed mobility comments: Sitting up in recliner upon PT arrival. Verbaly discussed sit>supine transfer with log roll.   Transfers Overall transfer level: Modified independent Equipment used: None             General transfer comment: Pt demonstrated proper hand placement on seated surface for safety.  Ambulation/Gait Ambulation/Gait assistance: Modified independent (Device/Increase time) Gait Distance (Feet): 400 Feet Assistive  device: None Gait Pattern/deviations: Step-through pattern;Decreased stride length;Trunk flexed Gait velocity: Decreased Gait velocity interpretation: >2.62 ft/sec, indicative of community ambulatory General Gait Details: Pt ambulating well with no unsteadiness or LOB noted. Pt maintained good posture throughout.   Stairs Stairs: Yes Stairs assistance: Modified independent (Device/Increase time) Stair Management: One rail Right;Alternating pattern;Forwards Number of Stairs: 10 General stair comments: Pt demonstrated good negotiation of stairs without complaints of pain.   Wheelchair Mobility    Modified Rankin (Stroke Patients Only)       Balance Overall balance assessment: No apparent balance deficits (not formally assessed)                                           Pertinent Vitals/Pain Pain Assessment: Faces Faces Pain Scale: Hurts a little bit Pain Location: incision Pain Descriptors / Indicators: Operative site guarding Pain Intervention(s): Limited activity within patient's tolerance;Monitored during session;Repositioned    Home Living Family/patient expects to be discharged to:: Private residence Living Arrangements: Spouse/significant other Available Help at Discharge: Family;Available PRN/intermittently Type of Home: House Home Access: Stairs to enter Entrance Stairs-Rails: Right Entrance Stairs-Number of Steps: 7 Home Layout: One level Home Equipment: None      Prior Function Level of Independence: Independent         Comments: retired, 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 Dominance   Dominant Hand: Right    Extremity/Trunk Assessment   Upper Extremity Assessment Upper Extremity Assessment: Overall WFL for tasks assessed    Lower Extremity Assessment Lower Extremity Assessment: Overall WFL for tasks assessed    Cervical / Trunk Assessment Cervical / Trunk Assessment: (s/p back surgery)  Communication   Communication:  HOH(with hearing aids)  Cognition Arousal/Alertness: Awake/alert Behavior During Therapy: WFL for tasks assessed/performed Overall Cognitive Status: Within Functional Limits for tasks assessed                                        General Comments      Exercises     Assessment/Plan    PT Assessment Patent does not need any further PT services  PT Problem List         PT Treatment Interventions      PT Goals (Current goals can be found in the Care Plan section)  Acute Rehab PT Goals Patient Stated Goal: return to walking his dog PT Goal Formulation: All assessment and education complete, DC therapy    Frequency     Barriers to discharge        Co-evaluation               AM-PAC PT "6 Clicks" Mobility  Outcome Measure Help needed turning from your back to your side while in a flat bed without using bedrails?: None Help needed moving from lying on your back to sitting on the side of a flat bed without using bedrails?: None Help needed moving to and from a bed to a chair (including a wheelchair)?: None Help needed standing up from a chair using your arms (e.g., wheelchair or bedside chair)?: None Help needed to walk in hospital room?: None Help needed climbing 3-5 steps with a railing? : None 6 Click Score: 24    End of Session Equipment Utilized During Treatment: Back brace Activity Tolerance: Patient tolerated treatment well Patient left: in chair;with call bell/phone within reach Nurse Communication: Mobility status PT Visit Diagnosis: Pain;Other symptoms and signs involving the nervous system (R29.898) Pain - part of body: (back)    Time: 9381-8299 PT Time Calculation (min) (ACUTE ONLY): 17 min   Charges:   PT Evaluation $PT Eval Low Complexity: 1 Low          Nathaniel Bush, PT, DPT Acute Rehabilitation Services Pager: [phone] Office: [phone]   Thelma Comp 10/01/2019, 11:47 AM

## 2019-10-01 NOTE — Plan of Care (Signed)
Patient alert and oriented, mae's well, voiding adequate amount of urine, swallowing without difficulty, no c/o pain at time of discharge. Patient discharged home with family. Script and discharged instructions given to patient. Patient and family stated understanding of instructions given. Patient has an appointment with Dr. Jenkins   

## 2019-10-01 NOTE — Discharge Instructions (Signed)
Restart Eliquis on 10/05/2019  Wound Care Keep incision covered and dry for two days.    Do not put any creams, lotions, or ointments on incision. Leave steri-strips on back.  They will fall off by themselves. Activity Walk each and every day, increasing distance each day. No lifting greater than 5 lbs.  Avoid excessive neck motion. No driving for 2 weeks; may ride as a passenger locally.  Diet Resume your normal diet.  Return to Work Will be discussed at your follow up appointment. Call Your Doctor If Any of These Occur Redness, drainage, or swelling at the wound.  Temperature greater than 101 degrees. Severe pain not relieved by pain medication. Incision starts to come apart. Follow Up Appt Call today for appointment [phone]) or for problems.  If you have any hardware placed in your spine, you will need an x-ray before your appointment.

## 2019-10-01 NOTE — Evaluation (Signed)
Occupational Therapy Evaluation and Discharge Patient Details Name: Nathaniel Bush MRN: [id number] DOB: 1951-02-19 Today's Date: 10/01/2019    History of Present Illness Pt is a 68 year old man admitted for L4-5 PLIF. PMH: depression, afib, aflutter s/p ablation.   Clinical Impression   Pt is functioning modified independently in ADL. All education completed. Pt has support of his wife at home who is a Marine scientist. Pt is verbalizing understanding and/or demonstrating understanding of all information. No further OT needs.    Follow Up Recommendations  No OT follow up    Equipment Recommendations  None recommended by OT    Recommendations for Other Services       Precautions / Restrictions Precautions Precautions: Back Precaution Booklet Issued: Yes (comment) Precaution Comments: educated in back precautions and reinforced with written handout Required Braces or Orthoses: Spinal Brace Spinal Brace: Lumbar corset;Applied in sitting position Restrictions Weight Bearing Restrictions: No      Mobility Bed Mobility Overal bed mobility: Needs Assistance Bed Mobility: Rolling;Sidelying to Sit Rolling: Supervision Sidelying to sit: Supervision       General bed mobility comments: verbal cues for technique  Transfers Overall transfer level: Modified independent Equipment used: None                  Balance                                           ADL either performed or assessed with clinical judgement   ADL Overall ADL's : Modified independent                                       General ADL Comments: Educated pt in compensatory strategies for ADL, body mechanics and IADL to avoid.     Vision Patient Visual Report: No change from baseline       Perception     Praxis      Pertinent Vitals/Pain Pain Assessment: Faces Faces Pain Scale: Hurts a little bit Pain Location: incision Pain Descriptors / Indicators: Operative  site guarding Pain Intervention(s): Monitored during session     Hand Dominance Right   Extremity/Trunk Assessment Upper Extremity Assessment Upper Extremity Assessment: Overall WFL for tasks assessed   Lower Extremity Assessment Lower Extremity Assessment: Defer to PT evaluation       Communication Communication Communication: HOH(with hearing aids)   Cognition Arousal/Alertness: Awake/alert Behavior During Therapy: WFL for tasks assessed/performed Overall Cognitive Status: Within Functional Limits for tasks assessed                                     General Comments       Exercises     Shoulder Instructions      Home Living Family/patient expects to be discharged to:: Private residence Living Arrangements: Spouse/significant other Available Help at Discharge: Family;Available PRN/intermittently Type of Home: House Home Access: Stairs to enter [employer] of Steps: 7 Entrance Stairs-Rails: Right Home Layout: One level     Bathroom Shower/Tub: Occupational psychologist: Standard     Home Equipment: None          Prior Functioning/Environment Level of Independence: Independent        Comments: retired,  likes to walk his greyhound        OT Problem List:        OT Treatment/Interventions:      OT Goals(Current goals can be found in the care plan section) Acute Rehab OT Goals Patient Stated Goal: return to walking his dog  OT Frequency:     Barriers to D/C:            Co-evaluation              AM-PAC OT "6 Clicks" Daily Activity     Outcome Measure Help from another person eating meals?: None Help from another person taking care of personal grooming?: None Help from another person toileting, which includes using toliet, bedpan, or urinal?: None Help from another person bathing (including washing, rinsing, drying)?: None Help from another person to put on and taking off regular upper body  clothing?: None Help from another person to put on and taking off regular lower body clothing?: None 6 Click Score: 24   End of Session Equipment Utilized During Treatment: Back brace  Activity Tolerance: Patient tolerated treatment well Patient left: in chair;with call bell/phone within reach  OT Visit Diagnosis: Pain                Time: 7017-7939 OT Time Calculation (min): 28 min Charges:  OT General Charges $OT Visit: 1 Visit OT Evaluation $OT Eval Low Complexity: 1 Low OT Treatments $Self Care/Home Management : 8-22 mins  Martie Round, OTR/L Acute Rehabilitation Services Pager: [phone] Office: [phone]  Evern Bio 10/01/2019, 11:14 AM

## 2019-10-01 NOTE — Discharge Summary (Signed)
Physician Discharge Summary     Providing Compassionate, Quality Care - Together   Patient ID: Nathaniel Bush MRN: [id number] DOB/AGE: [birth year] 68 y.o.  Admit date: 09/30/2019 Discharge date: 10/01/2019  Admission Diagnoses:  Spondylolisthesis of lumbar region  Discharge Diagnoses:  Active Problems:   Spondylolisthesis of lumbar region   Discharged Condition: good  Hospital Course: Patient was admitted to 3C02 following L4-5 transforaminal lumbar interbody fusion. He has done well post operatively. He has ambulated in the hall. His pain is reasonably controlled. He is ready for discharge home.  Consults: rehabilitation medicine  Significant Diagnostic Studies: radiology: DG Lumbar Spine 2-3 Views  Result Date: 09/30/2019 CLINICAL DATA:  Status post posterior fusion. EXAM: LUMBAR SPINE - 2-3 VIEW COMPARISON:  MRI lumbar spine dated 06/04/2019 FINDINGS: The patient has undergone reported L4-L5 posterior fusion. There is an interbody spacer at the L4-L5 level. There is severe disc height loss at the L5-S1 level. Postsurgical changes are noted. IMPRESSION: 1. Status post L4-L5 posterior fusion. No complicating features. 2. Severe disc height loss at L5-S1. Electronically Signed   By: Katherine Mantle M.D.   On: 09/30/2019 16:16   DG Lumbar Spine 1 View  Result Date: 09/30/2019 CLINICAL DATA:  Lumbar instrumentation EXAM: LUMBAR SPINE - 1 VIEW COMPARISON:  September 04, 2019 FINDINGS: A lateral radiograph of the lumbar spine demonstrates instrumentation at the L4-L5 disc space level. There is grade 1 anterolisthesis of L4 on L5. There is severe disc height loss at the L5-S1 level. IMPRESSION: Instrumentation of the lumbar spine as above. Electronically Signed   By: Katherine Mantle M.D.   On: 09/30/2019 16:18   DG C-Arm 1-60 Min  Result Date: 09/30/2019 CLINICAL DATA:  Status post posterior fusion. EXAM: DG C-ARM 1-60 MIN FLUOROSCOPY TIME:  Fluoroscopy Time:  14 seconds Number  of Acquired Spot Images: 2 COMPARISON:  MRI lumbar spine dated 06/04/2019 FINDINGS: The patient is status post posterior fusion from L4-L5. There is an interbody spacer at the L4-L5 level. Severe disc height loss is noted at the L5-S1 level. IMPRESSION: Status post posterior fusion as above. Electronically Signed   By: Katherine Mantle M.D.   On: 09/30/2019 16:17     Treatments: surgery: Bilateral L4-5 laminotomy/foraminotomies/medial facetectomy to decompress the bilateral L4 and L5 nerve roots(the work required to do this was in addition to the work required to do the posterior lumbar interbody fusion because of the patient's spinal stenosis, facet arthropathy. Etc. requiring a wide decompression of the nerve roots.);  L4-5 transforaminal lumbar interbody fusion with local morselized autograft bone and Zimmer DBM; insertion of interbody prosthesis at L4-5 (globus peek expandable interbody prosthesis); posterior nonsegmental instrumentation from L4 to L5 with globus titanium pedicle screws and rods; posterior lateral arthrodesis at L4-5 with local morselized autograft bone and Zimmer DBM.  Discharge Exam: Blood pressure (!) 129/54, pulse 61, temperature 99.4 F (37.4 C), temperature source Oral, resp. rate 16, height 5\' 10"  (1.778 m), weight 72.6 kg, SpO2 100 %.   Alert and oriented x 4 PERRLA CN II-XII grossly intact MAE, sensation intact Incision is covered with Honeycomb dressing and Steri Strips; Dressing is clean, dry, and intact   Disposition: Discharge disposition: 01-Home or Self Care        Allergies as of 10/01/2019   No Known Allergies     Medication List    TAKE these medications   ALPRAZolam 0.25 MG tablet Commonly known as: XANAX TAKE 1 TABLET ONCE DAILY AS NEEDED FOR ANXIETY. What changed: See  the new instructions.   apixaban 5 MG Tabs tablet Commonly known as: Eliquis Take 1 tablet (5 mg total) by mouth 2 (two) times daily.   cyclobenzaprine 10 MG  tablet Commonly known as: FLEXERIL Take 1 tablet (10 mg total) by mouth 3 (three) times daily as needed for muscle spasms.   docusate sodium 100 MG capsule Commonly known as: COLACE Take 1 capsule (100 mg total) by mouth 2 (two) times daily.   lithium carbonate 300 MG capsule TAKE 1 CAPSULE BY MOUTH  DAILY   lithium carbonate 450 MG CR tablet Commonly known as: ESKALITH Take 1 tablet (450 mg total) by mouth daily.   Oxycodone HCl 10 MG Tabs Take 1 tablet (10 mg total) by mouth every 4 (four) hours as needed for severe pain ((score 7 to 10)).      Follow-up Information    Newman Pies, MD. Schedule an appointment as soon as possible for a visit in 2 week(s).   Specialty: Neurosurgery Contact information: [address] [place] [address] [place] [address] [phone]           Signed: Patricia Nettle 10/01/2019, 11:07 AM

## 2019-10-08 ENCOUNTER — Encounter (HOSPITAL_COMMUNITY): Admission: RE | Payer: Self-pay | Source: Home / Self Care

## 2019-10-08 ENCOUNTER — Inpatient Hospital Stay (HOSPITAL_COMMUNITY): Admission: RE | Admit: 2019-10-08 | Payer: Medicare Other | Source: Home / Self Care | Admitting: Orthopedic Surgery

## 2019-10-08 SURGERY — TRANSFORAMINAL LUMBAR INTERBODY FUSION (TLIF) WITH PEDICLE SCREW FIXATION 2 LEVEL
Anesthesia: General | Laterality: Left

## 2019-10-09 MED FILL — Heparin Sodium (Porcine) Inj 1000 Unit/ML: INTRAMUSCULAR | Qty: 30 | Status: AC

## 2019-10-09 MED FILL — Sodium Chloride IV Soln 0.9%: INTRAVENOUS | Qty: 1000 | Status: AC

## 2019-10-23 ENCOUNTER — Ambulatory Visit: Payer: Medicare Other | Attending: Internal Medicine

## 2019-10-23 DIAGNOSIS — Z23 Encounter for immunization: Secondary | ICD-10-CM | POA: Insufficient documentation

## 2019-10-23 NOTE — Progress Notes (Signed)
   Covid-19 Vaccination Clinic  Name:  Nathaniel Bush    MRN: [id number] DOB: 09/21/1951  10/23/2019  Nathaniel Bush was observed post Covid-19 immunization for 15 minutes without incidence. He was provided with Vaccine Information Sheet and instruction to access the V-Safe system.   Nathaniel Bush was instructed to call 911 with any severe reactions post vaccine: Marland Kitchen Difficulty breathing  . Swelling of your face and throat  . A fast heartbeat  . A bad rash all over your body  . Dizziness and weakness    Immunizations Administered    Name Date Dose VIS Date Route   Pfizer COVID-19 Vaccine 10/23/2019 10:00 AM 0.3 mL 09/11/2019 Intramuscular   Manufacturer: [employer], [employer]   Lot: [id number]   NDC: [address]-8

## 2019-11-10 ENCOUNTER — Ambulatory Visit: Payer: Medicare Other | Attending: Internal Medicine

## 2019-11-10 DIAGNOSIS — Z23 Encounter for immunization: Secondary | ICD-10-CM | POA: Insufficient documentation

## 2019-11-10 NOTE — Progress Notes (Signed)
   Covid-19 Vaccination Clinic  Name:  Damiel Barthold    MRN: [id number] DOB: 10-30-1950  11/10/2019  Mr. Stapel was observed post Covid-19 immunization for 15 minutes without incidence. He was provided with Vaccine Information Sheet and instruction to access the V-Safe system.   Mr. Withers was instructed to call 911 with any severe reactions post vaccine: Marland Kitchen Difficulty breathing  . Swelling of your face and throat  . A fast heartbeat  . A bad rash all over your body  . Dizziness and weakness    Immunizations Administered    Name Date Dose VIS Date Route   Pfizer COVID-19 Vaccine 11/10/2019  9:35 AM 0.3 mL 09/11/2019 Intramuscular   Manufacturer: [employer], [employer]   Lot: [id number]   NDC: [address]-0

## 2019-11-16 ENCOUNTER — Ambulatory Visit: Payer: Medicare Other

## 2019-11-26 ENCOUNTER — Ambulatory Visit: Payer: Medicare Other

## 2019-12-21 ENCOUNTER — Telehealth: Payer: Self-pay | Admitting: Psychiatry

## 2019-12-21 ENCOUNTER — Other Ambulatory Visit: Payer: Self-pay

## 2019-12-21 MED ORDER — LITHIUM CARBONATE ER 450 MG PO TBCR: 450.0000 mg | EXTENDED_RELEASE_TABLET | Freq: Every day | ORAL | 1 refills | Status: DC

## 2019-12-21 MED ORDER — LITHIUM CARBONATE 300 MG PO CAPS: 300.0000 mg | ORAL_CAPSULE | Freq: Every day | ORAL | 1 refills | Status: DC

## 2019-12-21 MED ORDER — LITHIUM CARBONATE 150 MG PO CAPS: 750.0000 mg | ORAL_CAPSULE | Freq: Every day | ORAL | 0 refills | Status: DC

## 2019-12-21 NOTE — Telephone Encounter (Signed)
Pt called and needs renewal for LITHIUM. Pt states he usually gets two doses 450 MG and 300 MG, and he has to pay for both doses and he wants to know if he can get a 750 MG tab instead? Pt also wants to have it sent to [facility]. He was not able to give me an address or fax for them but he gave me the # [phone]. He says if you have a hard time trying to get the info for [facility] then it's okay to send through [facility]. He did not want to use [employer] because they messed up a few time with his RX's.

## 2019-12-21 NOTE — Telephone Encounter (Signed)
Pt changed his mind and wants to do the two tabs.

## 2019-12-21 NOTE — Telephone Encounter (Signed)
Called pt and he is okay with 5 150 MG cap.

## 2019-12-21 NOTE — Telephone Encounter (Signed)
They do not make a 750 mg size tablet.  It would end up being too large to comfortably swallow.  If he wants we could switch to 5 of the 150 mg capsules and that would give him 1 copayment.

## 2020-01-29 ENCOUNTER — Other Ambulatory Visit: Payer: Self-pay | Admitting: Internal Medicine

## 2020-01-29 DIAGNOSIS — E785 Hyperlipidemia, unspecified: Secondary | ICD-10-CM

## 2020-02-16 ENCOUNTER — Ambulatory Visit
Admission: RE | Admit: 2020-02-16 | Discharge: 2020-02-16 | Disposition: A | Discharge status: Home / Self Care | Payer: Self-pay | Source: Ambulatory Visit | Attending: Internal Medicine | Admitting: Internal Medicine

## 2020-02-16 DIAGNOSIS — E785 Hyperlipidemia, unspecified: Secondary | ICD-10-CM

## 2020-02-17 ENCOUNTER — Other Ambulatory Visit: Payer: Self-pay | Admitting: Internal Medicine

## 2020-02-17 DIAGNOSIS — R918 Other nonspecific abnormal finding of lung field: Secondary | ICD-10-CM

## 2020-03-04 ENCOUNTER — Telehealth: Payer: Self-pay | Admitting: Cardiology

## 2020-03-04 DIAGNOSIS — E785 Hyperlipidemia, unspecified: Secondary | ICD-10-CM

## 2020-03-04 LAB — LIPID PANEL
Chol/HDL Ratio: 2.4 ratio (ref 0.0–5.0)
Cholesterol, Total: 153 mg/dL (ref 100–199)
HDL: 63 mg/dL (ref >39)
LDL Chol Calc (NIH): 76 mg/dL (ref 0–99)
Triglycerides: 69 mg/dL (ref 0–149)
VLDL Cholesterol Cal: 14 mg/dL (ref 5–40)

## 2020-03-04 NOTE — Telephone Encounter (Signed)
    Pt is wondering if he can come in to do his lab work today, there's no order for lab work yet. He also wanted to know if the result is going to be available on Monday when he sees Dr. Antoine Poche   Please advise

## 2020-03-04 NOTE — Telephone Encounter (Signed)
Documentation is in the chart that a fasting lipid profile should be done. Patient requesting to do lipid panel today so results are available at appointment. Informed patient there is always a possibility Dr. Antoine Poche wants further lab work at his office visit which may result in being stuck more than once. Patient verbalized understanding but wants to complete the lipid panel today. Order placed per documentation.

## 2020-03-06 ENCOUNTER — Other Ambulatory Visit: Payer: Self-pay | Admitting: Psychiatry

## 2020-03-06 DIAGNOSIS — E785 Hyperlipidemia, unspecified: Secondary | ICD-10-CM | POA: Insufficient documentation

## 2020-03-06 NOTE — Progress Notes (Signed)
Cardiology Office Note   Date:  03/07/2020   ID:  Nathaniel Bush, DOB January 05, 1951, MRN [id number]  PCP:  Nathaniel Ran, MD  Cardiologist:   Nathaniel Rotunda, MD   Chief Complaint  Patient presents with  . Atrial Flutter      History of Present Illness: Nathaniel Bush is a 69 y.o. male who follow up of atrial fib.   He is status post atrial flutter ablation.  He recently had a coronary calcium score which was 89.4 which put him at the 48th percentile.  He had a lumbar spinal fusion.  He has been back riding his bike.  He exercises vigorously. The patient denies any new symptoms such as chest discomfort, neck or arm discomfort. There has been no new shortness of breath, PND or orthopnea. There have been no reported palpitations, presyncope or syncope.   Past Medical History:  Diagnosis Date  . Depression   . Dysrhythmia    aflutter  . PAF (paroxysmal atrial fibrillation) (HCC)   . Palpitations     Past Surgical History:  Procedure Laterality Date  . A-FLUTTER ABLATION N/A 08/14/2019   Procedure: A-FLUTTER ABLATION;  Surgeon: Regan Lemming, MD;  Location: [facility]ASIVE [facility];  Service: Cardiovascular;  Laterality: N/A;  . INGUINAL HERNIA REPAIR    . TOE SURGERY       Current Outpatient Medications  Medication Sig Dispense Refill  . lithium carbonate (ESKALITH) 450 MG CR tablet Take 1 tablet (450 mg total) by mouth daily. 90 tablet 1  . lithium carbonate 300 MG capsule Take 1 capsule (300 mg total) by mouth daily. 90 capsule 1   No current facility-administered medications for this visit.    Allergies:   Patient has no known allergies.    ROS:  Please see the history of present illness.   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   All other systems are reviewed and negative.    PHYSICAL EXAM: VS:  BP 130/78   Pulse (!) 46   Temp (!) 97.2 F (36.2 C)   Ht 5\' 10"  (1.778 m)   Wt 162 lb (73.5 kg)   SpO2 99%   BMI 23.24 kg/m  , BMI Body mass  index is 23.24 kg/m. GENERAL:  Well appearing NECK:  No jugular venous distention, waveform within normal limits, carotid upstroke brisk and symmetric, no bruits, no thyromegaly LUNGS:  Clear to auscultation bilaterally CHEST:  Unremarkable HEART:  PMI not displaced or sustained,S1 and S2 within normal limits, no S3, no S4, no clicks, no rubs, no murmurs ABD:  Flat, positive bowel sounds normal in frequency in pitch, no bruits, no rebound, no guarding, no midline pulsatile mass, no hepatomegaly, no splenomegaly EXT:  2 plus pulses throughout, no edema, no cyanosis no clubbing    EKG:  EKG is  ordered today. The ekg ordered today demonstrates sinus bradycardia, rate 46, leftward axis, poor anterior R wave progression.   Recent Labs: 07/07/2019: Magnesium 2.4; TSH 3.170 09/24/2019: ALT 22 10/01/2019: BUN 15; Creatinine, Ser 0.85; Hemoglobin 12.3; Platelets 133; Potassium 4.3; Sodium 137    Lipid Panel    Component Value Date/Time   CHOL 153 03/04/2020 0856   TRIG 69 03/04/2020 0856   HDL 63 03/04/2020 0856   CHOLHDL 2.4 03/04/2020 0856   LDLCALC 76 03/04/2020 0856      Wt Readings from Last 3 Encounters:  03/07/20 162 lb (73.5 kg)  09/30/19 160 lb (72.6 kg)  09/24/19 165 lb 6.4  oz (75 kg)      Other studies Reviewed: Additional studies/ records that were reviewed today include: CT coronary calcium score ordered by Crist Infante, MD. Review of the above records demonstrates:  Please see elsewhere in the note.     ASSESSMENT AND PLAN:  ATRIAL FLUTTER:    He has had no symptomatic recurrence of this.  No change in therapy.  ELEVATED CORONARY CALCIUM: The patient does have a calcium score is elevated above.  His MESA score however was only 6.2.  He had a negative treadmill test in 2019.  We had a long discussion about this and I do not think he needs any further change in his therapy or testing although I probably would repeat the calcium score in about 3 or 4  years.  DYSLIPIDEMIA: Given the low MESA score as above the choice of taking a statin should be based on patient preference.  He has an excellent diet.  He has a very good HDL.  At this point he chooses not to take a statin and I agree with this.  He should continue with his tremendous lifestyle modification and exercise.  BRADYCARDIA: He has no symptoms related to this.  This is probably because of his good conditioning.  No change in therapy.  COVID EDUCATION: He has been vaccinated.  Current medicines are reviewed at length with the patient today.  The patient does not have concerns regarding medicines.  The following changes have been made:  None  Labs/ tests ordered today include: None  Orders Placed This Encounter  Procedures  . EKG 12-Lead     Disposition:   FU with me in 2 years.     Signed, Minus Breeding, MD  03/07/2020 11:50 AM    [facility]

## 2020-03-07 ENCOUNTER — Other Ambulatory Visit: Payer: Self-pay

## 2020-03-07 ENCOUNTER — Ambulatory Visit (INDEPENDENT_AMBULATORY_CARE_PROVIDER_SITE_OTHER): Payer: Medicare Other | Admitting: Cardiology

## 2020-03-07 ENCOUNTER — Encounter: Payer: Self-pay | Admitting: Cardiology

## 2020-03-07 VITALS — BP 130/78 | HR 46 | Temp 97.2°F | Ht 70.0 in | Wt 162.0 lb

## 2020-03-07 DIAGNOSIS — R001 Bradycardia, unspecified: Secondary | ICD-10-CM

## 2020-03-07 DIAGNOSIS — R931 Abnormal findings on diagnostic imaging of heart and coronary circulation: Secondary | ICD-10-CM

## 2020-03-07 DIAGNOSIS — Z7189 Other specified counseling: Secondary | ICD-10-CM

## 2020-03-07 DIAGNOSIS — I4892 Unspecified atrial flutter: Secondary | ICD-10-CM | POA: Diagnosis not present

## 2020-03-07 DIAGNOSIS — E785 Hyperlipidemia, unspecified: Secondary | ICD-10-CM | POA: Diagnosis not present

## 2020-03-07 NOTE — Patient Instructions (Signed)
Medication Instructions:  NO CHANGES *If you need a refill on your cardiac medications before your next appointment, please call your pharmacy*  Lab Work: NONE ORDERED THIS VISIT  Testing/Procedures: NONE ORDERED THIS VISIT  Follow-Up: At [facility], you and your health needs are our priority.  As part of our continuing mission to provide you with exceptional heart care, we have created designated Provider Care Teams.  These Care Teams include your primary Cardiologist (physician) and Advanced Practice Providers (APPs -  Physician Assistants and Nurse Practitioners) who all work together to provide you with the care you need, when you need it.   Your next appointment:   24 month(s)   You will receive a reminder letter in the mail two months in advance. If you don't receive a letter, please call our office to schedule the follow-up appointment.  The format for your next appointment:   In Person  Provider:   Rollene Rotunda, MD

## 2020-03-14 ENCOUNTER — Ambulatory Visit (INDEPENDENT_AMBULATORY_CARE_PROVIDER_SITE_OTHER): Payer: Medicare Other | Admitting: Psychiatry

## 2020-03-14 ENCOUNTER — Other Ambulatory Visit: Payer: Self-pay

## 2020-03-14 ENCOUNTER — Encounter: Payer: Self-pay | Admitting: Psychiatry

## 2020-03-14 ENCOUNTER — Ambulatory Visit: Payer: Medicare Other | Admitting: Psychiatry

## 2020-03-14 DIAGNOSIS — F4310 Post-traumatic stress disorder, unspecified: Secondary | ICD-10-CM

## 2020-03-14 DIAGNOSIS — F411 Generalized anxiety disorder: Secondary | ICD-10-CM

## 2020-03-14 DIAGNOSIS — F319 Bipolar disorder, unspecified: Secondary | ICD-10-CM | POA: Diagnosis not present

## 2020-03-14 MED ORDER — LITHIUM CARBONATE ER 450 MG PO TBCR: 450.0000 mg | EXTENDED_RELEASE_TABLET | Freq: Every day | ORAL | 1 refills | Status: DC

## 2020-03-14 MED ORDER — LITHIUM CARBONATE 300 MG PO CAPS: 300.0000 mg | ORAL_CAPSULE | Freq: Every day | ORAL | 1 refills | Status: DC

## 2020-03-14 NOTE — Progress Notes (Signed)
Nathaniel Bush [id number] 1951/09/03 69 y.o.  Subjective:   [name] is a 69 y.o. (DOB June 08, 1951) male.  Chief Complaint:  Chief Complaint  Patient presents with  . Follow-up  . Depression  . Anxiety  . Sleeping Problem    HPI   Nathaniel Bush presents to the office today for follow-up of depression and anxiety.    seen in March 16, 2019.  It was relatively stable except for marital stress.  No meds were changed.  09/15/2019 appointment with the following noted: Had Aflutter with successful ablation. Overall mood good without change since here.  Nothing's changed.  Including meds.  Handling stress of socialization and Covid very well.  No unusual issues.. Really slowed down watching the news bc creating stress. Not taking much Xanax.  No problems with the lithium.  No meds were changed.  03/14/2020 appointment with the following noted: Back fusion Christmas.  Doing great with that.  Dr. Arnoldo Morale.  Riding bike in 3-4 weeks. 10 years ago would be down and thinking neg things when first awakens in morning and back that way again.  Stressor wife unhappy with her job and complains of it a lot and wife doesn't like things going on in the family relationships.  These don't help her dealing with him bc she doesn't treat him well.  Her work is getting better and that is helping so he's more hopeful.  Not really look for med changes. Rare Xanax helps him remain calm with wife and tolerates it well.  Not depressed but frustrated with marriage. Patient reports stable mood and denies depressed or irritable moods.  Patient denies any recent difficulty with anxiety.  Patient denies difficulty with sleep initiation or maintenance. Denies appetite disturbance.  Patient reports that energy and motivation have been good.  Patient denies any difficulty with concentration.  Patient denies any suicidal ideation.  Still riding bike regularly.  Fastest time this year yesterday.  Past  psychiatric medication trials include inVega, Cerefolin NAC, Deplin, Viibryd, mirtazapine, Cytomel, lamotrigine,  venlafaxine, Zoloft, Wellbutrin, Trintellix. nefazodone, mirtazapine, Viibryd which cause GI pain,Wellbutrin, duloxetine, Celexa, Lexapro, buspirone,  carbamazepine, Trileptal, risperidone, Abilify,  trazodone 25 mg which cause manic symptoms,  propranolol   I am uncertain if he is taking Depakote before  Review of Systems:  Review of Systems  Cardiovascular: Negative for palpitations.  Musculoskeletal: Negative for arthralgias and back pain.  Neurological: Negative for tremors and weakness.  Psychiatric/Behavioral: Negative for agitation, behavioral problems, confusion, decreased concentration, dysphoric mood, hallucinations, self-injury, sleep disturbance and suicidal ideas. The patient is not nervous/anxious and is not hyperactive.     Medications: I have reviewed the patient's current medications.  Current Outpatient Medications  Medication Sig Dispense Refill  . ALPRAZolam (XANAX) 0.25 MG tablet Take 0.25 mg by mouth 2 (two) times daily as needed for anxiety.    Marland Kitchen lithium carbonate (ESKALITH) 450 MG CR tablet Take 1 tablet (450 mg total) by mouth daily. 90 tablet 1  . lithium carbonate 300 MG capsule Take 1 capsule (300 mg total) by mouth daily. 90 capsule 1   No current facility-administered medications for this visit.    Medication Side Effects: None except mild GI  Allergies: No Known Allergies  Past Medical History:  Diagnosis Date  . Depression   . Dysrhythmia    aflutter  . PAF (paroxysmal atrial fibrillation) ([place])   . Palpitations     Family History  Problem Relation Age of Onset  . Atrial fibrillation Father  pacemaker  . Heart disease Paternal Grandmother        pacemaker    Social History   Socioeconomic History  . Marital status: Married    Spouse name: Not on file  . Number of children: 2  . Years of education: Not on file  .  Highest education level: Not on file  Occupational History  . Not on file  Tobacco Use  . Smoking status: Never Smoker  . Smokeless tobacco: Never Used  Vaping Use  . Vaping Use: Never used  Substance and Sexual Activity  . Alcohol use: Yes    Alcohol/week: 3.0 standard drinks    Types: 2 Glasses of wine, 1 Cans of beer per week    Comment: daily  . Drug use: Never  . Sexual activity: Not on file  Other Topics Concern  . Not on file  Social History Narrative   Lives at home with wife.  Retired Futures trader.    Social Determinants of Health   Financial Resource Strain:   . Difficulty of Paying Living Expenses:   Food Insecurity:   . Worried About Programme researcher, broadcasting/film/video in the Last Year:   . Barista in the Last Year:   Transportation Needs:   . Freight forwarder (Medical):   Marland Kitchen Lack of Transportation (Non-Medical):   Physical Activity:   . Days of Exercise per Week:   . Minutes of Exercise per Session:   Stress:   . Feeling of Stress :   Social Connections:   . Frequency of Communication with Friends and Family:   . Frequency of Social Gatherings with Friends and Family:   . Attends Religious Services:   . Active Member of Clubs or Organizations:   . Attends Banker Meetings:   Marland Kitchen Marital Status:   Intimate Partner Violence:   . Fear of Current or Ex-Partner:   . Emotionally Abused:   Marland Kitchen Physically Abused:   . Sexually Abused:     Past Medical History, Surgical history, Social history, and Family history were reviewed and updated as appropriate.   W has no immediate plans to retire.  Please see review of systems for further details on the patient's review from today.   Objective:   Physical Exam:  There were no vitals taken for this visit.  Physical Exam Constitutional:      General: He is not in acute distress.    Appearance: He is well-developed.  Musculoskeletal:        General: No deformity.  Neurological:     Mental Status:  He is alert and oriented to person, place, and time.     Motor: No tremor.     Coordination: Coordination normal.     Gait: Gait normal.  Psychiatric:        Attention and Perception: He is attentive.        Mood and Affect: Mood is depressed. Mood is not anxious. Affect is not labile, blunt, angry or inappropriate.        Speech: Speech normal.        Behavior: Behavior normal.        Thought Content: Thought content normal. Thought content does not include homicidal or suicidal ideation. Thought content does not include homicidal or suicidal plan.        Judgment: Judgment normal.     Comments: Insight intact. No auditory or visual hallucinations.  Recent intrusive thoughts resolved.  Slight morning depression.  Lab Review:     Component Value Date/Time   NA 137 10/01/2019 0323   NA 140 07/21/2019 1001   K 4.3 10/01/2019 0323   CL 105 10/01/2019 0323   CO2 26 10/01/2019 0323   GLUCOSE 162 (H) 10/01/2019 0323   BUN 15 10/01/2019 0323   BUN 15 07/21/2019 1001   CREATININE 0.85 10/01/2019 0323   CALCIUM 9.2 10/01/2019 0323   PROT 6.8 09/24/2019 0832   PROT 6.5 07/07/2019 1106   ALBUMIN 4.6 09/24/2019 0832   ALBUMIN 4.6 07/07/2019 1106   AST 20 09/24/2019 0832   ALT 22 09/24/2019 0832   ALKPHOS 43 09/24/2019 0832   BILITOT 1.7 (H) 09/24/2019 0832   BILITOT 1.5 (H) 07/07/2019 1106   GFRNONAA >60 10/01/2019 0323   GFRAA >60 10/01/2019 0323       Component Value Date/Time   WBC 13.2 (H) 10/01/2019 0323   RBC 3.92 (L) 10/01/2019 0323   HGB 12.3 (L) 10/01/2019 0323   HGB 14.7 07/21/2019 1001   HCT 35.5 (L) 10/01/2019 0323   HCT 42.0 07/21/2019 1001   PLT 133 (L) 10/01/2019 0323   PLT 189 07/21/2019 1001   MCV 90.6 10/01/2019 0323   MCV 90 07/21/2019 1001   MCH 31.4 10/01/2019 0323   MCHC 34.6 10/01/2019 0323   RDW 11.9 10/01/2019 0323   RDW 11.8 07/21/2019 1001   LYMPHSABS 1.5 09/24/2019 0832   LYMPHSABS 1.6 07/07/2019 1106   MONOABS 0.5 09/24/2019 0832    EOSABS 0.4 09/24/2019 0832   EOSABS 0.3 07/07/2019 1106   BASOSABS 0.1 09/24/2019 0832   BASOSABS 0.1 07/07/2019 1106    No results found for: POCLITH, LITHIUM   No results found for: PHENYTOIN, PHENOBARB, VALPROATE, CBMZ  Last lithium level May 13, 2018 on 750 mg lithium per day was 0.7  Lithium September 18, 2019= 0.8 Lithium 03/2020 = 0.72   Labs received did not dated April 07, 2019: Normal BMP with creatinine 1.0 calcium 9.6.  Normal CBC and lipid profile.  Normal TSH Vitamin D 36.9 Testosterone 7.6 with normal 6.6-18.1 Lithium 0.8 No indication for  .res Assessment: Plan:   Nathaniel Bush was seen today for follow-up, depression, anxiety and sleeping problem.  Diagnoses and all orders for this visit:  Bipolar affective disorder, rapid cycling (HCC) -     lithium carbonate (ESKALITH) 450 MG CR tablet; Take 1 tablet (450 mg total) by mouth daily. -     lithium carbonate 300 MG capsule; Take 1 capsule (300 mg total) by mouth daily.  Generalized anxiety disorder  PTSD (post-traumatic stress disorder)     Medication sensitive but tolerates lithium well.  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  He is medication sensitive and is not typically stable for very long.  Part of the problem is he has a historical tendency to change medications on his own which complicates treatment.  Fortunately he has not done that over the last year or so.  We reinforced the importance of compliance.  Reviewed the long list of prior psychiatric medications.  He has some benefit from the lithium for his depression which is very helpful.  Her lithium level is stable.  He is currently stable for several months.  He acknowledges the benefit of the lithium overall and that his overall mood has been good for the last several months. He tends to stay busy with retirement and cycles and it has helped.    Marital stresses chronic but no worse.  No med changes today and he agrees with the  exception of adding vitamin D  Disc labs above at length including normal BMP, CBC, TSH, testosterone in the low normal range and vitamin D in the low normal range.  Lithium level is stable.  No change indicated.  Take with pm meal for nausea.  Recommend vitamin D at least through the winter.  We discussed the potential mental health benefits for seasonal depression as well as recent studies suggesting middle age man with vitamin D levels in the top three quarters of the normal range have better long term health outcomes independent and dependent on Covid infections.Goal with vitamin D IMO above 50. Suggest supplement 2000-5000 units daily through the winter.  Counseled patient regarding potential benefits, risks, and side effects of lithium to include potential risk of lithium affecting thyroid and renal function.  Discussed need for periodic lab monitoring to determine drug level and to assess for potential adverse effects.  Counseled patient regarding signs and symptoms of lithium toxicity and advised that they notify office immediately or seek urgent medical attention if experiencing these signs and symptoms.  Patient advised to contact office with any questions or concerns.  6 mos  Meredith Staggers, MD, DFAPA  Please see After Visit Summary for patient specific instructions.  No future appointments.  No orders of the defined types were placed in this encounter.     -------------------------------

## 2020-03-28 ENCOUNTER — Other Ambulatory Visit: Payer: Self-pay | Admitting: Psychiatry

## 2020-03-28 NOTE — Telephone Encounter (Signed)
Due back 12/21, rarely uses. Last fill 09/30/19

## 2020-04-12 ENCOUNTER — Encounter: Payer: Self-pay | Admitting: Psychiatry

## 2020-04-12 NOTE — Progress Notes (Signed)
Lithium level 1.0.  No change indicated.

## 2020-04-14 ENCOUNTER — Telehealth: Payer: Self-pay | Admitting: Psychiatry

## 2020-04-14 NOTE — Telephone Encounter (Signed)
Patient called and said that over the last two weeks he has been hearing what people say to him but can't understand the words or what they mean. He said it was like his brain is in a fog. He also said that his right hand is shaking or have tremors. He wants to know is this the cause of the lithium . He just had a physical and all is good there. Please call him back at [phone]

## 2020-04-15 ENCOUNTER — Other Ambulatory Visit: Payer: Self-pay

## 2020-04-15 DIAGNOSIS — F319 Bipolar disorder, unspecified: Secondary | ICD-10-CM

## 2020-04-15 MED ORDER — LITHIUM CARBONATE 300 MG PO CAPS: 600.0000 mg | ORAL_CAPSULE | Freq: Every day | ORAL | 1 refills | Status: DC

## 2020-04-15 NOTE — Telephone Encounter (Signed)
Reduce lithium from 750 mg to 600   mg daily and see if that helps.

## 2020-04-15 NOTE — Telephone Encounter (Signed)
Patient is aware of reduction to 600 mg daily of lithium. Patient asked for an updated Rx be sent to [facility]. Sent for 90 day supply. Patient had no other questions or concerns at this time.

## 2020-05-31 ENCOUNTER — Ambulatory Visit: Payer: Medicare Other | Attending: Internal Medicine

## 2020-05-31 DIAGNOSIS — Z23 Encounter for immunization: Secondary | ICD-10-CM

## 2020-05-31 NOTE — Progress Notes (Signed)
   Covid-19 Vaccination [facility]  Name:  Nathaniel Bush    MRN: [id number] DOB: 12/30/1950  05/31/2020  Nathaniel Bush was observed post Covid-19 immunization for 15 minutes without incident. He was provided with Vaccine Information Sheet and instruction to access the V-Safe system.   Nathaniel Bush was instructed to call 911 with any severe reactions post vaccine: Marland Kitchen Difficulty breathing  . Swelling of face and throat  . A fast heartbeat  . A bad rash all over body  . Dizziness and weakness

## 2020-06-14 ENCOUNTER — Telehealth: Payer: Self-pay | Admitting: Psychiatry

## 2020-06-14 NOTE — Telephone Encounter (Signed)
Please review

## 2020-06-14 NOTE — Telephone Encounter (Signed)
Pt called to report due to tremors Lithium was decreased from 750 mg to 600 mg. Started feeling bad in the morning. Increased back to 750 mg now feeling bad in the AM just as before decrease. As the morning goes on he starts feeling better. Please call pt.  Contact # [phone]  Apt 12/13

## 2020-06-14 NOTE — Telephone Encounter (Signed)
How long ago did he increase lithium back to 750 mg daily?  It can take a while for it to help, I.e. several weeks.

## 2020-06-15 NOTE — Telephone Encounter (Signed)
Left pt. A vm to return my call.

## 2020-06-16 ENCOUNTER — Telehealth: Payer: Self-pay

## 2020-06-16 NOTE — Telephone Encounter (Signed)
Put him on the cancellation list.  Patient is highly med sensitive and has failed multiple other medications.  We should not make further med changes until he has an appointment.  In the meantime have him take the Xanax since it seems helpful.  Just take the lowest dose that is effective.

## 2020-06-16 NOTE — Telephone Encounter (Signed)
Left patient a detailed message and asked him to call back with any questions.

## 2020-06-20 ENCOUNTER — Other Ambulatory Visit: Payer: Self-pay | Admitting: Psychiatry

## 2020-06-21 NOTE — Telephone Encounter (Signed)
Next apt 12/13

## 2020-08-09 ENCOUNTER — Ambulatory Visit
Admission: RE | Admit: 2020-08-09 | Discharge: 2020-08-09 | Disposition: A | Discharge status: Home / Self Care | Payer: Medicare Other | Source: Ambulatory Visit | Attending: Internal Medicine | Admitting: Internal Medicine

## 2020-08-09 ENCOUNTER — Other Ambulatory Visit: Payer: Self-pay

## 2020-08-09 DIAGNOSIS — R918 Other nonspecific abnormal finding of lung field: Secondary | ICD-10-CM

## 2020-09-12 ENCOUNTER — Other Ambulatory Visit: Payer: Self-pay

## 2020-09-12 ENCOUNTER — Ambulatory Visit (INDEPENDENT_AMBULATORY_CARE_PROVIDER_SITE_OTHER): Payer: Medicare Other | Admitting: Psychiatry

## 2020-09-12 ENCOUNTER — Encounter: Payer: Self-pay | Admitting: Psychiatry

## 2020-09-12 DIAGNOSIS — F411 Generalized anxiety disorder: Secondary | ICD-10-CM

## 2020-09-12 DIAGNOSIS — F319 Bipolar disorder, unspecified: Secondary | ICD-10-CM

## 2020-09-12 DIAGNOSIS — F4310 Post-traumatic stress disorder, unspecified: Secondary | ICD-10-CM | POA: Diagnosis not present

## 2020-09-12 DIAGNOSIS — Z79899 Other long term (current) drug therapy: Secondary | ICD-10-CM

## 2020-09-12 MED ORDER — LITHIUM CARBONATE 300 MG PO CAPS: 600.0000 mg | ORAL_CAPSULE | Freq: Every day | ORAL | 1 refills | Status: DC

## 2020-09-12 NOTE — Progress Notes (Signed)
Nathaniel Bush [id number] 03-23-1951 69 y.o.  Subjective:   [name] is a 69 y.o. (DOB 1951-03-06) male.  Chief Complaint:  Chief Complaint  Patient presents with  . Follow-up  . Depression  . Anxiety    HPI   Nathaniel Bush presents to the office today for follow-up of depression and anxiety.    seen in March 16, 2019.  It was relatively stable except for marital stress.  No meds were changed.  09/15/2019 appointment with the following noted: Had Aflutter with successful ablation. Overall mood good without change since here.  Nothing's changed.  Including meds.  Handling stress of socialization and Covid very well.  No unusual issues.. Really slowed down watching the news bc creating stress. Not taking much Xanax.  No problems with the lithium.  No meds were changed.  03/14/2020 appointment with the following noted: Back fusion Christmas.  Doing great with that.  Dr. Lovell Sheehan.  Riding bike in 3-4 weeks. 10 years ago would be down and thinking neg things when first awakens in morning and back that way again.  Stressor wife unhappy with her job and complains of it a lot and wife doesn't like things going on in the family relationships.  These don't help her dealing with him bc she doesn't treat him well.  Her work is getting better and that is helping so he's more hopeful.  Not really look for med changes. Rare Xanax helps him remain calm with wife and tolerates it well.  Plan: no med changes except suggest vit D in winter  04/14/20 TC: Patient called and said that over the last two weeks he has been hearing what people say to him but can't understand the words or what they mean. He said it was like his brain is in a fog. He also said that his right hand is shaking or have tremors. He wants to know is this the cause of the lithium . He just had a physical and all is good there MD response: Reduce lithium from 750 mg to 600   mg daily and see if that helps.  06/14/2020  phone call patient stating he started feeling bad in the morning after reducing lithium to 600 mg daily and he increase it back to 750 mg.  He called complaining he was still having some depression.  It was suggested he move up his appointment.  09/12/20 appt with following noted: Currently back to 600 mg lithium.  Had gone back up to 750 mg.  Now doesn't think it  was meds making him feel bad and he got better and  Now back to 600 mg lithium for 6 weeks.  Doing well mood wise now.  Same marital issues and handling it as best he can.  Journaling is therapeutic and grounding.  Want to talk about it but she is unapproachable.  Thinking about counseling again.  [name].  Says he shows her his best side everyday.  She refuses to be close to him or touch him.   Rare Xanax. Tremor gone.  Not depressed but frustrated with marriage. Patient reports stable mood and denies depressed or irritable moods.  Patient denies any recent difficulty with anxiety.  Patient denies difficulty with sleep initiation or maintenance. Denies appetite disturbance.  Patient reports that energy and motivation have been good.  Patient denies any difficulty with concentration.  Patient denies any suicidal ideation.  Still riding bike regularly.  Fastest time this year yesterday.  Past psychiatric  medication trials include inVega, Cerefolin NAC, Deplin, Viibryd, mirtazapine, Cytomel, lamotrigine,  venlafaxine, Zoloft, Wellbutrin, Trintellix. nefazodone, mirtazapine, Viibryd which cause GI pain,Wellbutrin, duloxetine, Celexa, Lexapro, buspirone,  carbamazepine, Trileptal, risperidone, Abilify,  trazodone 25 mg which cause manic symptoms,  propranolol   Lithium 600-750 I am uncertain if he is taking Depakote before  Review of Systems:  Review of Systems  Cardiovascular: Negative for chest pain and palpitations.  Musculoskeletal: Negative for arthralgias and back pain.  Neurological: Negative for tremors and weakness.   Psychiatric/Behavioral: Negative for agitation, behavioral problems, confusion, decreased concentration, dysphoric mood, hallucinations, self-injury, sleep disturbance and suicidal ideas. The patient is not nervous/anxious and is not hyperactive.     Medications: I have reviewed the patient's current medications.  Current Outpatient Medications  Medication Sig Dispense Refill  . ALPRAZolam (XANAX) 0.25 MG tablet TAKE 1 TABLET ONCE DAILY AS NEEDED FOR ANXIETY. 15 tablet 0  . lithium carbonate 300 MG capsule Take 2 capsules (600 mg total) by mouth daily. 180 capsule 1   No current facility-administered medications for this visit.    Medication Side Effects: None except mild GI  Allergies: No Known Allergies  Past Medical History:  Diagnosis Date  . Depression   . Dysrhythmia    aflutter  . PAF (paroxysmal atrial fibrillation) (HCC)   . Palpitations     Family History  Problem Relation Age of Onset  . Atrial fibrillation Father        pacemaker  . Heart disease Paternal Grandmother        pacemaker    Social History   Socioeconomic History  . Marital status: Married    Spouse name: Not on file  . Number of children: 2  . Years of education: Not on file  . Highest education level: Not on file  Occupational History  . Not on file  Tobacco Use  . Smoking status: Never Smoker  . Smokeless tobacco: Never Used  Vaping Use  . Vaping Use: Never used  Substance and Sexual Activity  . Alcohol use: Yes    Alcohol/week: 3.0 standard drinks    Types: 2 Glasses of wine, 1 Cans of beer per week    Comment: daily  . Drug use: Never  . Sexual activity: Not on file  Other Topics Concern  . Not on file  Social History Narrative   Lives at home with wife.  Retired Futures trader.    Social Determinants of Health   Financial Resource Strain: Not on file  Food Insecurity: Not on file  Transportation Needs: Not on file  Physical Activity: Not on file  Stress: Not on file   Social Connections: Not on file  Intimate Partner Violence: Not on file    Past Medical History, Surgical history, Social history, and Family history were reviewed and updated as appropriate.   W has no immediate plans to retire.  Please see review of systems for further details on the patient's review from today.   Objective:   Physical Exam:  There were no vitals taken for this visit.  Physical Exam Constitutional:      General: He is not in acute distress.    Appearance: He is well-developed.  Musculoskeletal:        General: No deformity.  Neurological:     Mental Status: He is alert and oriented to person, place, and time.     Motor: No tremor.     Coordination: Coordination normal.     Gait: Gait normal.  Psychiatric:        Attention and Perception: He is attentive.        Mood and Affect: Mood is not anxious or depressed. Affect is not labile, blunt, angry or inappropriate.        Speech: Speech normal.        Behavior: Behavior normal.        Thought Content: Thought content normal. Thought content does not include homicidal or suicidal ideation. Thought content does not include homicidal or suicidal plan.        Cognition and Memory: Cognition normal.        Judgment: Judgment normal.     Comments: Insight intact. No auditory or visual hallucinations.  Recent intrusive thoughts resolved.  Minimal morning depression.    Lab Review:     Component Value Date/Time   NA 137 10/01/2019 0323   NA 140 07/21/2019 1001   K 4.3 10/01/2019 0323   CL 105 10/01/2019 0323   CO2 26 10/01/2019 0323   GLUCOSE 162 (H) 10/01/2019 0323   BUN 15 10/01/2019 0323   BUN 15 07/21/2019 1001   CREATININE 0.85 10/01/2019 0323   CALCIUM 9.2 10/01/2019 0323   PROT 6.8 09/24/2019 0832   PROT 6.5 07/07/2019 1106   ALBUMIN 4.6 09/24/2019 0832   ALBUMIN 4.6 07/07/2019 1106   AST 20 09/24/2019 0832   ALT 22 09/24/2019 0832   ALKPHOS 43 09/24/2019 0832   BILITOT 1.7 (H) 09/24/2019  0832   BILITOT 1.5 (H) 07/07/2019 1106   GFRNONAA >60 10/01/2019 0323   GFRAA >60 10/01/2019 0323       Component Value Date/Time   WBC 13.2 (H) 10/01/2019 0323   RBC 3.92 (L) 10/01/2019 0323   HGB 12.3 (L) 10/01/2019 0323   HGB 14.7 07/21/2019 1001   HCT 35.5 (L) 10/01/2019 0323   HCT 42.0 07/21/2019 1001   PLT 133 (L) 10/01/2019 0323   PLT 189 07/21/2019 1001   MCV 90.6 10/01/2019 0323   MCV 90 07/21/2019 1001   MCH 31.4 10/01/2019 0323   MCHC 34.6 10/01/2019 0323   RDW 11.9 10/01/2019 0323   RDW 11.8 07/21/2019 1001   LYMPHSABS 1.5 09/24/2019 0832   LYMPHSABS 1.6 07/07/2019 1106   MONOABS 0.5 09/24/2019 0832   EOSABS 0.4 09/24/2019 0832   EOSABS 0.3 07/07/2019 1106   BASOSABS 0.1 09/24/2019 0832   BASOSABS 0.1 07/07/2019 1106    No results found for: POCLITH, LITHIUM   No results found for: PHENYTOIN, PHENOBARB, VALPROATE, CBMZ  Last lithium level May 13, 2018 on 750 mg lithium per day was 0.7  Lithium September 18, 2019= 0.8 Lithium 03/2020 = 0.72   Labs received did not dated April 07, 2019: Normal BMP with creatinine 1.0 calcium 9.6.  Normal CBC and lipid profile.  Normal TSH Vitamin D 36.9 Testosterone 7.6 with normal 6.6-18.1 Lithium 0.8 No indication for  .res Assessment: Plan:   Rodarius was seen today for follow-up, depression and anxiety.  Diagnoses and all orders for this visit:  Bipolar affective disorder, rapid cycling (HCC) -     Basic metabolic panel -     Lithium level -     TSH  Generalized anxiety disorder  PTSD (post-traumatic stress disorder)  Lithium use -     Basic metabolic panel -     Lithium level -     TSH    Medication sensitive but tolerates lithium well.  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  He is medication sensitive and is not typically stable for very long.  Part of the problem is he has a historical tendency to change medications on his own which complicates treatment.  Fortunately he has  not done that over the last year or so.  We reinforced the importance of compliance.  Reviewed the long list of prior psychiatric medications.  He has some benefit from the lithium for his depression which is very helpful.  Her lithium level is stable.  He is currently stable for several months.  He acknowledges the benefit of the lithium overall and that his overall mood has been good for the last several months. He tends to stay busy with retirement and cycles and it has helped.    Marital stresses chronic but no worse.  No med changes today and he agrees with the exception of adding vitamin D  Disc labs above at length including normal BMP, CBC, TSH, testosterone in the low normal range and vitamin D in the low normal range.  Lithium level is stable.  No change indicated.  Take with pm meal for nausea. Pending PE Check labs.  Recommend vitamin D at least through the winter.  We discussed the potential mental health benefits for seasonal depression as well as recent studies suggesting middle age man with vitamin D levels in the top three quarters of the normal range have better long term health outcomes independent and dependent on Covid infections.Goal with vitamin D IMO above 50. Suggest supplement 2000-5000 units daily through the winter.  Counseled patient regarding potential benefits, risks, and side effects of lithium to include potential risk of lithium affecting thyroid and renal function.  Discussed need for periodic lab monitoring to determine drug level and to assess for potential adverse effects.  Counseled patient regarding signs and symptoms of lithium toxicity and advised that they notify office immediately or seek urgent medical attention if experiencing these signs and symptoms.  Patient advised to contact office with any questions or concerns.  Supportive therapy.  He follows Rodell Pernaeepak Chopra and spirituality of enlightenment.  6 mos  Meredith Staggersarey Cottle, MD, DFAPA  Please see After  Visit Summary for patient specific instructions.  No future appointments.  Orders Placed This Encounter  Procedures  . Basic metabolic panel  . Lithium level  . TSH      -------------------------------

## 2020-09-26 ENCOUNTER — Other Ambulatory Visit: Payer: Self-pay | Admitting: Psychiatry

## 2020-11-30 ENCOUNTER — Telehealth: Payer: Self-pay | Admitting: Psychiatry

## 2020-11-30 NOTE — Telephone Encounter (Signed)
PT would like [place] to send in both doses of Lithium in for him to  [facility] pharmacy. HE would like to start using [facility] and not use [facility] anymore.

## 2020-12-01 ENCOUNTER — Other Ambulatory Visit: Payer: Self-pay

## 2020-12-01 DIAGNOSIS — F319 Bipolar disorder, unspecified: Secondary | ICD-10-CM

## 2020-12-01 MED ORDER — LITHIUM CARBONATE 300 MG PO CAPS: 600.0000 mg | ORAL_CAPSULE | Freq: Every day | ORAL | 1 refills | Status: DC

## 2020-12-01 MED ORDER — LITHIUM CARBONATE ER 450 MG PO TBCR: 450.0000 mg | EXTENDED_RELEASE_TABLET | Freq: Every day | ORAL | 3 refills | Status: DC

## 2020-12-01 NOTE — Telephone Encounter (Signed)
Rx's sent to [facility]

## 2021-01-09 ENCOUNTER — Ambulatory Visit (INDEPENDENT_AMBULATORY_CARE_PROVIDER_SITE_OTHER): Payer: Medicare Other | Admitting: Psychiatry

## 2021-01-09 ENCOUNTER — Encounter: Payer: Self-pay | Admitting: Psychiatry

## 2021-01-09 ENCOUNTER — Other Ambulatory Visit: Payer: Self-pay

## 2021-01-09 DIAGNOSIS — F411 Generalized anxiety disorder: Secondary | ICD-10-CM

## 2021-01-09 DIAGNOSIS — Z79899 Other long term (current) drug therapy: Secondary | ICD-10-CM | POA: Diagnosis not present

## 2021-01-09 DIAGNOSIS — F4310 Post-traumatic stress disorder, unspecified: Secondary | ICD-10-CM | POA: Diagnosis not present

## 2021-01-09 DIAGNOSIS — F319 Bipolar disorder, unspecified: Secondary | ICD-10-CM

## 2021-01-09 MED ORDER — LITHIUM CARBONATE ER 450 MG PO TBCR: 450.0000 mg | EXTENDED_RELEASE_TABLET | Freq: Every day | ORAL | 1 refills | Status: DC

## 2021-01-09 MED ORDER — ALPRAZOLAM 0.25 MG PO TABS
0.2500 mg | ORAL_TABLET | Freq: Two times a day (BID) | ORAL | 0 refills | Status: DC | PRN
Start: 2021-01-09 — End: 2021-10-20

## 2021-01-09 MED ORDER — LITHIUM CARBONATE 300 MG PO CAPS: 600.0000 mg | ORAL_CAPSULE | Freq: Every day | ORAL | 1 refills | Status: DC

## 2021-01-09 NOTE — Progress Notes (Signed)
Nathaniel Bush [id number] [birth year] 70 y.o.  Subjective:   [name] is a 70 y.o. (DOB 1951/05/25) male.  Chief Complaint:  Chief Complaint  Patient presents with  . Follow-up  . Bipolar affective disorder, rapid cycling [facility])    HPI   [name] presents to the office today for follow-up of depression and anxiety.    seen in March 16, 2019.  It was relatively stable except for marital stress.  No meds were changed.  09/15/2019 appointment with the following noted: Had Aflutter with successful ablation. Overall mood good without change since here.  Nothing's changed.  Including meds.  Handling stress of socialization and Covid very well.  No unusual issues.. Really slowed down watching the news bc creating stress. Not taking much Xanax.  No problems with the lithium.  No meds were changed.  03/14/2020 appointment with the following noted: Back fusion Christmas.  Doing great with that.  Dr. Lovell Sheehan.  Riding bike in 3-4 weeks. 10 years ago would be down and thinking neg things when first awakens in morning and back that way again.  Stressor wife unhappy with her job and complains of it a lot and wife doesn't like things going on in the family relationships.  These don't help her dealing with him bc she doesn't treat him well.  Her work is getting better and that is helping so he's more hopeful.  Not really look for med changes. Rare Xanax helps him remain calm with wife and tolerates it well.  Plan: no med changes except suggest vit D in winter  04/14/20 TC: Patient called and said that over the last two weeks he has been hearing what people say to him but can't understand the words or what they mean. He said it was like his brain is in a fog. He also said that his right hand is shaking or have tremors. He wants to know is this the cause of the lithium . He just had a physical and all is good there MD response: Reduce lithium from 750 mg to 600   mg daily and see if  that helps.  06/14/2020 phone call patient stating he started feeling bad in the morning after reducing lithium to 600 mg daily and he increase it back to 750 mg.  He called complaining he was still having some depression.  It was suggested he move up his appointment.  09/12/20 appt with following noted: Currently back to 600 mg lithium.  Had gone back up to 750 mg.  Now doesn't think it  was meds making him feel bad and he got better and  Now back to 600 mg lithium for 6 weeks.  Doing well mood wise now.  Same marital issues and handling it as best he can.  Journaling is therapeutic and grounding.  Want to talk about it but she is unapproachable.  Thinking about counseling again.  Wife [name].  Says he shows her his best side everyday.  She refuses to be close to him or touch him.   Rare Xanax. Tremor gone. Plan: Overall not depressed.  No med changes today  01/09/2021 appointment with the following noted: In jan taking lithium 450 and level 0.5. Then started feeling more stressed with wife and decided to increase to  Wife gotten meaner and wants to talk down to hip and resents that.  Xanax helps but not sure lithium helps.  Only used about once weekly. When initially wakes thinks of wife and gets  down.  Can't talk with wife bc she gets angry.  He feels powerless to do anything about it. He feels wife has deep seated unknown resentment against him but refuses to go to counseling with him. Increased lithium to 750 mg about Feb without any changes noticed.   Sleep good.   No excess alcohol.  Not depressed but frustrated with marriage. Patient reports stable mood and denies depressed or irritable moods.  Patient denies any recent difficulty with anxiety.  Patient denies difficulty with sleep initiation or maintenance. Denies appetite disturbance.  Patient reports that energy and motivation have been good.  Patient denies any difficulty with concentration.  Patient denies any suicidal  ideation.  Still riding bike regularly.  Fastest time this year yesterday.  Past psychiatric medication trials include inVega, Cerefolin NAC, Deplin, Viibryd, mirtazapine, Cytomel, lamotrigine,  venlafaxine, Zoloft, Wellbutrin, Trintellix. nefazodone, mirtazapine, Viibryd which cause GI pain,Wellbutrin, duloxetine, Celexa, Lexapro, buspirone,  carbamazepine, Trileptal, risperidone, Abilify,  trazodone 25 mg which cause manic symptoms,  propranolol   Lithium 600-750 I am uncertain if he is taking Depakote before  Review of Systems:  Review of Systems  Cardiovascular: Negative for chest pain and palpitations.  Musculoskeletal: Negative for arthralgias and back pain.  Neurological: Positive for headaches. Negative for tremors and weakness.  Psychiatric/Behavioral: Negative for agitation, behavioral problems, confusion, decreased concentration, dysphoric mood, hallucinations, self-injury, sleep disturbance and suicidal ideas. The patient is not nervous/anxious and is not hyperactive.     Medications: I have reviewed the patient's current medications.  Current Outpatient Medications  Medication Sig Dispense Refill  . ALPRAZolam (XANAX) 0.25 MG tablet Take 1 tablet (0.25 mg total) by mouth 2 (two) times daily as needed for anxiety. 30 tablet 0  . lithium carbonate (ESKALITH) 450 MG CR tablet Take 1 tablet (450 mg total) by mouth daily. 90 tablet 1  . lithium carbonate 300 MG capsule Take 2 capsules (600 mg total) by mouth daily. 180 capsule 1   No current facility-administered medications for this visit.    Medication Side Effects: None except mild GI  Allergies: No Known Allergies  Past Medical History:  Diagnosis Date  . Depression   . Dysrhythmia    aflutter  . PAF (paroxysmal atrial fibrillation) (HCC)   . Palpitations     Family History  Problem Relation Age of Onset  . Atrial fibrillation Father        pacemaker  . Heart disease Paternal Grandmother        pacemaker     Social History   Socioeconomic History  . Marital status: Married    Spouse name: Not on file  . Number of children: 2  . Years of education: Not on file  . Highest education level: Not on file  Occupational History  . Not on file  Tobacco Use  . Smoking status: Never Smoker  . Smokeless tobacco: Never Used  Vaping Use  . Vaping Use: Never used  Substance and Sexual Activity  . Alcohol use: Yes    Alcohol/week: 3.0 standard drinks    Types: 2 Glasses of wine, 1 Cans of beer per week    Comment: daily  . Drug use: Never  . Sexual activity: Not on file  Other Topics Concern  . Not on file  Social History Narrative   Lives at home with wife.  Retired Futures trader.    Social Determinants of Health   Financial Resource Strain: Not on file  Food Insecurity: Not on file  Transportation  Needs: Not on file  Physical Activity: Not on file  Stress: Not on file  Social Connections: Not on file  Intimate Partner Violence: Not on file    Past Medical History, Surgical history, Social history, and Family history were reviewed and updated as appropriate.   W has no immediate plans to retire.  Please see review of systems for further details on the patient's review from today.   Objective:   Physical Exam:  There were no vitals taken for this visit.  Physical Exam Constitutional:      General: He is not in acute distress.    Appearance: He is well-developed.  Musculoskeletal:        General: No deformity.  Neurological:     Mental Status: He is alert and oriented to person, place, and time.     Motor: No tremor.     Coordination: Coordination normal.     Gait: Gait normal.  Psychiatric:        Attention and Perception: He is attentive.        Mood and Affect: Mood is not anxious or depressed. Affect is not labile, blunt, angry or inappropriate.        Speech: Speech normal.        Behavior: Behavior normal.        Thought Content: Thought content normal.  Thought content does not include homicidal or suicidal ideation. Thought content does not include homicidal or suicidal plan.        Cognition and Memory: Cognition normal.        Judgment: Judgment normal.     Comments: Insight intact. No auditory or visual hallucinations.  Recent intrusive thoughts resolved.  Minimal morning depression.    Lab Review:     Component Value Date/Time   NA 137 10/01/2019 0323   NA 140 07/21/2019 1001   K 4.3 10/01/2019 0323   CL 105 10/01/2019 0323   CO2 26 10/01/2019 0323   GLUCOSE 162 (H) 10/01/2019 0323   BUN 15 10/01/2019 0323   BUN 15 07/21/2019 1001   CREATININE 0.85 10/01/2019 0323   CALCIUM 9.2 10/01/2019 0323   PROT 6.8 09/24/2019 0832   PROT 6.5 07/07/2019 1106   ALBUMIN 4.6 09/24/2019 0832   ALBUMIN 4.6 07/07/2019 1106   AST 20 09/24/2019 0832   ALT 22 09/24/2019 0832   ALKPHOS 43 09/24/2019 0832   BILITOT 1.7 (H) 09/24/2019 0832   BILITOT 1.5 (H) 07/07/2019 1106   GFRNONAA >60 10/01/2019 0323   GFRAA >60 10/01/2019 0323       Component Value Date/Time   WBC 13.2 (H) 10/01/2019 0323   RBC 3.92 (L) 10/01/2019 0323   HGB 12.3 (L) 10/01/2019 0323   HGB 14.7 07/21/2019 1001   HCT 35.5 (L) 10/01/2019 0323   HCT 42.0 07/21/2019 1001   PLT 133 (L) 10/01/2019 0323   PLT 189 07/21/2019 1001   MCV 90.6 10/01/2019 0323   MCV 90 07/21/2019 1001   MCH 31.4 10/01/2019 0323   MCHC 34.6 10/01/2019 0323   RDW 11.9 10/01/2019 0323   RDW 11.8 07/21/2019 1001   LYMPHSABS 1.5 09/24/2019 0832   LYMPHSABS 1.6 07/07/2019 1106   MONOABS 0.5 09/24/2019 0832   EOSABS 0.4 09/24/2019 0832   EOSABS 0.3 07/07/2019 1106   BASOSABS 0.1 09/24/2019 0832   BASOSABS 0.1 07/07/2019 1106    No results found for: POCLITH, LITHIUM   No results found for: PHENYTOIN, PHENOBARB, VALPROATE, CBMZ  Last lithium level May 13, 2018 on 750 mg lithium per day was 0.7  Lithium September 18, 2019= 0.8 Lithium 03/2020 = 0.72   Labs received did not dated April 07, 2019: Normal BMP with creatinine 1.0 calcium 9.6.  Normal CBC and lipid profile.  Normal TSH Vitamin D 36.9 Testosterone 7.6 with normal 6.6-18.1 Lithium 0.8  10/06/20 lithium 0.5 on 450 mg daily  .res Assessment: Plan:   Onalee HuaDavid was seen today for follow-up and bipolar affective disorder, rapid cycling (hcc).  Diagnoses and all orders for this visit:  Bipolar affective disorder, rapid cycling (HCC) -     lithium carbonate (ESKALITH) 450 MG CR tablet; Take 1 tablet (450 mg total) by mouth daily. -     lithium carbonate 300 MG capsule; Take 2 capsules (600 mg total) by mouth daily.  Generalized anxiety disorder -     ALPRAZolam (XANAX) 0.25 MG tablet; Take 1 tablet (0.25 mg total) by mouth 2 (two) times daily as needed for anxiety.  PTSD (post-traumatic stress disorder)  Lithium use    Medication sensitive but tolerates lithium well.  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  He is medication sensitive and is not typically stable for very long.  Part of the problem is he has a historical tendency to change medications on his own which complicates treatment.  Fortunately he has not done that over the last year or so.  We reinforced the importance of compliance.  Reviewed the long list of prior psychiatric medications.  He has some benefit from the lithium for his depression which is very helpful.  Her lithium level is stable.  He is currently stable for several months.  He acknowledges the benefit of the lithium overall and that his overall mood has been good for the last several months. He tends to stay busy with retirement and cycles and it has helped.    Marital stresses chronic but no worse.  Stress dealing with wife.  Disc ways to deal with it given her refusal to go to counseling. Disc alcohol which is not a problme for him.  No med changes today and he agrees with the exception of adding vitamin D  Continue lithium 750 mg daily.  Check lithium level,  labs at his convenience.  Recommend vitamin D at least through the winter.  We discussed the potential mental health benefits for seasonal depression as well as recent studies suggesting middle age man with vitamin D levels in the top three quarters of the normal range have better long term health outcomes independent and dependent on Covid infections.Goal with vitamin D IMO above 50. Suggest supplement 2000-5000 units daily through the winter.  Counseled patient regarding potential benefits, risks, and side effects of lithium to include potential risk of lithium affecting thyroid and renal function.  Discussed need for periodic lab monitoring to determine drug level and to assess for potential adverse effects.  Counseled patient regarding signs and symptoms of lithium toxicity and advised that they notify office immediately or seek urgent medical attention if experiencing these signs and symptoms.  Patient advised to contact office with any questions or concerns.  Supportive therapy.    6 mos  Meredith Staggersarey Cottle, MD, DFAPA  Please see After Visit Summary for patient specific instructions.  Future Appointments  Date Time Provider Department Center  03/13/2021 10:00 AM Cottle, Steva Readyarey G Jr., MD [facility] None    No orders of the defined types were placed in this encounter.     -------------------------------

## 2021-01-25 ENCOUNTER — Telehealth: Payer: Self-pay | Admitting: *Deleted

## 2021-01-25 NOTE — Telephone Encounter (Signed)
Booster scheduled for 01/30/21 @2 :45p at the [facility].

## 2021-01-30 ENCOUNTER — Ambulatory Visit: Payer: Medicare Other | Attending: Internal Medicine

## 2021-01-30 ENCOUNTER — Other Ambulatory Visit: Payer: Self-pay

## 2021-01-30 ENCOUNTER — Other Ambulatory Visit (HOSPITAL_BASED_OUTPATIENT_CLINIC_OR_DEPARTMENT_OTHER): Payer: Self-pay

## 2021-01-30 DIAGNOSIS — Z23 Encounter for immunization: Secondary | ICD-10-CM

## 2021-01-30 MED ORDER — PFIZER-BIONT COVID-19 VAC-TRIS 30 MCG/0.3ML IM SUSP
INTRAMUSCULAR | 0 refills | Status: DC
  Filled 2021-01-30: qty 0.3, 1d supply, fill #0

## 2021-01-30 NOTE — Progress Notes (Signed)
   Covid-19 Vaccination Clinic  Name:  Nathaniel Bush    MRN: [id number] DOB: 1951-01-14  01/30/2021  [name] was observed post Covid-19 immunization for 15 minutes without incident. He was provided with Vaccine Information Sheet and instruction to access the V-Safe system.   [name] was instructed to call 911 with any severe reactions post vaccine: Marland Kitchen Difficulty breathing  . Swelling of face and throat  . A fast heartbeat  . A bad rash all over body  . Dizziness and weakness   Immunizations Administered    Name Date Dose VIS Date Route   PFIZER Comrnaty(Gray TOP) Covid-19 Vaccine 01/30/2021  1:09 PM 0.3 mL 09/08/2020 Intramuscular   Manufacturer: [employer], [employer]   Lot: [id number]   NDC: [phone]

## 2021-03-13 ENCOUNTER — Ambulatory Visit: Payer: Medicare Other | Admitting: Psychiatry

## 2021-03-13 ENCOUNTER — Encounter: Payer: Self-pay | Admitting: Psychiatry

## 2021-03-13 ENCOUNTER — Other Ambulatory Visit: Payer: Self-pay

## 2021-03-13 DIAGNOSIS — F411 Generalized anxiety disorder: Secondary | ICD-10-CM

## 2021-03-13 DIAGNOSIS — F319 Bipolar disorder, unspecified: Secondary | ICD-10-CM | POA: Diagnosis not present

## 2021-03-13 DIAGNOSIS — Z79899 Other long term (current) drug therapy: Secondary | ICD-10-CM

## 2021-03-13 DIAGNOSIS — F4310 Post-traumatic stress disorder, unspecified: Secondary | ICD-10-CM | POA: Diagnosis not present

## 2021-03-13 MED ORDER — SERTRALINE HCL 25 MG PO TABS: ORAL_TABLET | ORAL | 1 refills | Status: DC

## 2021-03-13 NOTE — Progress Notes (Signed)
Nathaniel Bush [id number] [birth year] 70 y.o.  Subjective:   [name] is a 70 y.o. (DOB Aug 01, 1951) male.  Chief Complaint:  Chief Complaint  Patient presents with   Follow-up   Bipolar affective disorder, rapid cycling [facility])   Stress    HPI   Nathaniel Bush presents to the office today for follow-up of depression and anxiety.    seen in March 16, 2019.  It was relatively stable except for marital stress.  No meds were changed.  09/15/2019 appointment with the following noted: Had Aflutter with successful ablation. Overall mood good without change since here.  Nothing's changed.  Including meds.  Handling stress of socialization and Covid very well.  No unusual issues.. Really slowed down watching the news bc creating stress. Not taking much Xanax.  No problems with the lithium.  No meds were changed.  03/14/2020 appointment with the following noted: Back fusion Christmas.  Doing great with that.  Dr. Lovell Sheehan.  Riding bike in 3-4 weeks. 10 years ago would be down and thinking neg things when first awakens in morning and back that way again.  Stressor wife unhappy with her job and complains of it a lot and wife doesn't like things going on in the family relationships.  These don't help her dealing with him bc she doesn't treat him well.  Her work is getting better and that is helping so he's more hopeful.  Not really look for med changes. Rare Xanax helps him remain calm with wife and tolerates it well.  Plan: no med changes except suggest vit D in winter  04/14/20 TC: Patient called and said that over the last two weeks he has been hearing what people say to him but can't understand the words or what they mean. He said it was like his brain is in a fog. He also said that his right hand is shaking or have tremors. He wants to know is this the cause of the lithium . He just had a physical and all is good there MD response: Reduce lithium from 750 mg to 600   mg daily  and see if that helps.  06/14/2020 phone call patient stating he started feeling bad in the morning after reducing lithium to 600 mg daily and he increase it back to 750 mg.  He called complaining he was still having some depression.  It was suggested he move up his appointment.  09/12/20 appt with following noted: Currently back to 600 mg lithium.  Had gone back up to 750 mg.  Now doesn't think it  was meds making him feel bad and he got better and  Now back to 600 mg lithium for 6 weeks.  Doing well mood wise now.  Same marital issues and handling it as best he can.  Journaling is therapeutic and grounding.  Want to talk about it but she is unapproachable.  Thinking about counseling again.  Wife [name].  Says he shows her his best side everyday.  She refuses to be close to him or touch him.   Rare Xanax. Tremor gone. Plan: Overall not depressed.  No med changes today  01/09/2021 appointment with the following noted: In jan taking lithium 450 and level 0.5. Then started feeling more stressed with wife and decided to increase to  Wife gotten meaner and wants to talk down to hip and resents that.  Xanax helps but not sure lithium helps.  Only used about once weekly. When initially wakes thinks of  wife and gets down.  Can't talk with wife bc she gets angry.  He feels powerless to do anything about it. He feels wife has deep seated unknown resentment against him but refuses to go to counseling with him. Increased lithium to 750 mg about Feb without any changes noticed.   Sleep good.   No excess alcohol. Plan: Continue lithium but need to check labs.  03/13/2021 appt noted: Disc marital situation.   She denied resentment against him.  But then started a litany of resentments for years.  Felt good he was right.  She started to try  be nicer. Journals and it helps. But still feels terrible.  Has done the housework.  She wants to travel and he doesn't want to do it if she doesn't care for him. More  impatient with drivers again lately.  I don't know how to take the edge off that.  Blows horn too much.  Can be patient in other circumstances.  Gets annoyed with stoplights.  Depressed about situation with wife.  Doesn't think he's overall depressed.  Still awakens thinking of marriage.  Says he didn't have a good attitude about taking meds in the past and willing to retry meds. No sex and wife asexual.  Not depressed but frustrated with marriage. Patient reports stable mood and denies depressed or irritable moods.  Patient denies any recent difficulty with anxiety.  Patient denies difficulty with sleep initiation or maintenance. Denies appetite disturbance.  Patient reports that energy and motivation have been good.  Patient denies any difficulty with concentration.  Patient denies any suicidal ideation.  Still riding bike regularly.   Past psychiatric medication trials include Viibryd, venlafaxine, Zoloft, mirtazapine,  Viibryd which cause GI pain,Wellbutrin, duloxetine, Celexa, Lexapro, Wellbutrin, Trintellix. nefazodone,  inVega, Cerefolin NAC, Deplin,  Cytomel, lamotrigine, carbamazepine, Trileptal, risperidone, Abilify, buspirone,  trazodone 25 mg which cause manic symptoms,  propranolol   Lithium 600-750 I am uncertain if he is taking Depakote before  Review of Systems:  Review of Systems  Cardiovascular:  Negative for palpitations.  Musculoskeletal:  Negative for arthralgias and back pain.  Neurological:  Positive for headaches. Negative for tremors and weakness.  Psychiatric/Behavioral:  Negative for agitation, behavioral problems, confusion, decreased concentration, dysphoric mood, hallucinations, self-injury, sleep disturbance and suicidal ideas. The patient is not nervous/anxious and is not hyperactive.    Medications: I have reviewed the patient's current medications.  Current Outpatient Medications  Medication Sig Dispense Refill   ALPRAZolam (XANAX) 0.25 MG tablet Take 1  tablet (0.25 mg total) by mouth 2 (two) times daily as needed for anxiety. 30 tablet 0   COVID-19 mRNA Vac-TriS, Pfizer, (PFIZER-BIONT COVID-19 VAC-TRIS) SUSP injection Inject into the muscle. 0.3 mL 0   lithium carbonate (ESKALITH) 450 MG CR tablet Take 1 tablet (450 mg total) by mouth daily. 90 tablet 1   lithium carbonate 300 MG capsule Take 2 capsules (600 mg total) by mouth daily. 180 capsule 1   sertraline (ZOLOFT) 25 MG tablet 1/2 tablet daily for 1 week then 1 daily 30 tablet 1   No current facility-administered medications for this visit.    Medication Side Effects: None except mild GI  Allergies: No Known Allergies  Past Medical History:  Diagnosis Date   Depression    Dysrhythmia    aflutter   PAF (paroxysmal atrial fibrillation) (HCC)    Palpitations     Family History  Problem Relation Age of Onset   Atrial fibrillation Father  pacemaker   Heart disease Paternal Grandmother        pacemaker    Social History   Socioeconomic History   Marital status: Married    Spouse name: Not on file   Number of children: 2   Years of education: Not on file   Highest education level: Not on file  Occupational History   Not on file  Tobacco Use   Smoking status: Never   Smokeless tobacco: Never  Vaping Use   Vaping Use: Never used  Substance and Sexual Activity   Alcohol use: Yes    Alcohol/week: 3.0 standard drinks    Types: 2 Glasses of wine, 1 Cans of beer per week    Comment: daily   Drug use: Never   Sexual activity: Not on file  Other Topics Concern   Not on file  Social History Narrative   Lives at home with wife.  Retired Futures traderpolice detective.    Social Determinants of Health   Financial Resource Strain: Not on file  Food Insecurity: Not on file  Transportation Needs: Not on file  Physical Activity: Not on file  Stress: Not on file  Social Connections: Not on file  Intimate Partner Violence: Not on file    Past Medical History, Surgical  history, Social history, and Family history were reviewed and updated as appropriate.   W has no immediate plans to retire.  Please see review of systems for further details on the patient's review from today.   Objective:   Physical Exam:  There were no vitals taken for this visit.  Physical Exam Constitutional:      General: He is not in acute distress. Musculoskeletal:        General: No deformity.  Neurological:     Mental Status: He is alert and oriented to person, place, and time.     Coordination: Coordination normal.     Gait: Gait normal.  Psychiatric:        Attention and Perception: He is attentive.        Mood and Affect: Mood is not anxious or depressed. Affect is not labile, blunt, angry or inappropriate.        Speech: Speech normal.        Behavior: Behavior normal.        Thought Content: Thought content normal. Thought content does not include homicidal or suicidal ideation. Thought content does not include homicidal or suicidal plan.        Cognition and Memory: Cognition normal.        Judgment: Judgment normal.     Comments: Insight intact. No auditory or visual hallucinations.  Recent intrusive thoughts resolved.  Residual morning depression. More irritable.   Lab Review:     Component Value Date/Time   NA 137 10/01/2019 0323   NA 140 07/21/2019 1001   K 4.3 10/01/2019 0323   CL 105 10/01/2019 0323   CO2 26 10/01/2019 0323   GLUCOSE 162 (H) 10/01/2019 0323   BUN 15 10/01/2019 0323   BUN 15 07/21/2019 1001   CREATININE 0.85 10/01/2019 0323   CALCIUM 9.2 10/01/2019 0323   PROT 6.8 09/24/2019 0832   PROT 6.5 07/07/2019 1106   ALBUMIN 4.6 09/24/2019 0832   ALBUMIN 4.6 07/07/2019 1106   AST 20 09/24/2019 0832   ALT 22 09/24/2019 0832   ALKPHOS 43 09/24/2019 0832   BILITOT 1.7 (H) 09/24/2019 0832   BILITOT 1.5 (H) 07/07/2019 1106   GFRNONAA >60 10/01/2019 16100323  GFRAA >60 10/01/2019 0323       Component Value Date/Time   WBC 13.2 (H)  10/01/2019 0323   RBC 3.92 (L) 10/01/2019 0323   HGB 12.3 (L) 10/01/2019 0323   HGB 14.7 07/21/2019 1001   HCT 35.5 (L) 10/01/2019 0323   HCT 42.0 07/21/2019 1001   PLT 133 (L) 10/01/2019 0323   PLT 189 07/21/2019 1001   MCV 90.6 10/01/2019 0323   MCV 90 07/21/2019 1001   MCH 31.4 10/01/2019 0323   MCHC 34.6 10/01/2019 0323   RDW 11.9 10/01/2019 0323   RDW 11.8 07/21/2019 1001   LYMPHSABS 1.5 09/24/2019 0832   LYMPHSABS 1.6 07/07/2019 1106   MONOABS 0.5 09/24/2019 0832   EOSABS 0.4 09/24/2019 0832   EOSABS 0.3 07/07/2019 1106   BASOSABS 0.1 09/24/2019 0832   BASOSABS 0.1 07/07/2019 1106    No results found for: POCLITH, LITHIUM   No results found for: PHENYTOIN, PHENOBARB, VALPROATE, CBMZ  Last lithium level May 13, 2018 on 750 mg lithium per day was 0.7  Lithium September 18, 2019= 0.8 Lithium 03/2020 = 0.72   Labs received did not dated April 07, 2019: Normal BMP with creatinine 1.0 calcium 9.6.  Normal CBC and lipid profile.  Normal TSH Vitamin D 36.9 Testosterone 7.6 with normal 6.6-18.1 Lithium 0.8  10/06/20 lithium 0.5 on 450 mg daily  03/08/2021 lithium level 0.8 on 750 mg daily Dr.Perini  .res Assessment: Plan:   Nathaniel Bush was seen today for follow-up, bipolar affective disorder, rapid cycling (hcc) and stress.  Diagnoses and all orders for this visit:  Bipolar affective disorder, rapid cycling (HCC)  Generalized anxiety disorder -     sertraline (ZOLOFT) 25 MG tablet; 1/2 tablet daily for 1 week then 1 daily  PTSD (post-traumatic stress disorder) -     sertraline (ZOLOFT) 25 MG tablet; 1/2 tablet daily for 1 week then 1 daily  Lithium use   Medication sensitive but tolerates lithium well.  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  He is medication sensitive and is not typically stable for very long.  Part of the problem is he has a historical tendency to change medications on his own which complicates treatment.  Fortunately he has not  done that over the last year or so.  We reinforced the importance of compliance.  Reviewed the long list of prior psychiatric medications.  He has some benefit from the lithium for his depression which is very helpful.  Her lithium level is stable.  He is currently stable for several months.  He acknowledges the benefit of the lithium overall and that his overall mood has been good for the last several months. He tends to stay busy with retirement and cycles and it has helped.    Marital stresses chronic but no worse.  Stress dealing with wife.  Disc ways to deal with it given her refusal to go to counseling. Disc alcohol which is not a problme for him.  No med changes today and he agrees with the exception of adding vitamin D  Continue lithium 750 mg daily.  Start sertraline 25 mg 1/2 daily for 1 week then 1 daily for irritability and depressive sx  Counseled patient regarding potential benefits, risks, and side effects of lithium to include potential risk of lithium affecting thyroid and renal function.  Discussed need for periodic lab monitoring to determine drug level and to assess for potential adverse effects.  Counseled patient regarding signs and symptoms of lithium  toxicity and advised that they notify office immediately or seek urgent medical attention if experiencing these signs and symptoms.  Patient advised to contact office with any questions or concerns.  Supportive therapy.    6 mos  Meredith Staggers, MD, DFAPA  Please see After Visit Summary for patient specific instructions.  Future Appointments  Date Time Provider Department Center  06/28/2021 11:00 AM Cottle, Steva Ready., MD [facility] None    No orders of the defined types were placed in this encounter.     -------------------------------

## 2021-03-29 ENCOUNTER — Telehealth: Payer: Self-pay | Admitting: Psychiatry

## 2021-03-29 ENCOUNTER — Other Ambulatory Visit: Payer: Self-pay | Admitting: Psychiatry

## 2021-03-29 NOTE — Telephone Encounter (Signed)
Please advise 

## 2021-03-29 NOTE — Telephone Encounter (Signed)
Nathaniel Bush is still worth trying the lower dose.  I thought he had the 50 mg tablets.  Having cut the 25 mg tablets in half and take half of a 25 mg tablet.

## 2021-03-29 NOTE — Telephone Encounter (Signed)
Pt informed

## 2021-03-29 NOTE — Telephone Encounter (Signed)
Pt left a message stating that he has been on the zoloft for 17 days and is still having issues with his stomach. On a scale of 1-19 it is a 4. He said feels like it is helping with his PTSD but not with his anxiety or depression. He would like to know what dr. Jennelle Human wants him to do. If he is to stop the zoloft. Please give him a call at [phone]

## 2021-03-29 NOTE — Telephone Encounter (Signed)
I assume if he followed instructions that he is taking sertraline 50 mg daily.  Since he is having problems with his stomach having cut the dose back to one half of a 50 mg tablet and give it more time to see if he will get additional benefit.  Also be sure to take it with food.

## 2021-03-29 NOTE — Telephone Encounter (Signed)
Pt stated he is already taking 25 mg daily ,that's what was prescribed.

## 2021-05-31 ENCOUNTER — Ambulatory Visit: Payer: Medicare Other | Admitting: Psychiatry

## 2021-06-28 ENCOUNTER — Ambulatory Visit: Payer: Medicare Other | Admitting: Psychiatry

## 2021-07-03 ENCOUNTER — Ambulatory Visit: Payer: Medicare Other | Admitting: Psychiatry

## 2021-07-04 ENCOUNTER — Other Ambulatory Visit: Payer: Self-pay | Admitting: Internal Medicine

## 2021-07-04 DIAGNOSIS — R918 Other nonspecific abnormal finding of lung field: Secondary | ICD-10-CM

## 2021-07-18 ENCOUNTER — Other Ambulatory Visit: Payer: Self-pay | Admitting: Psychiatry

## 2021-07-18 DIAGNOSIS — F319 Bipolar disorder, unspecified: Secondary | ICD-10-CM

## 2021-07-24 ENCOUNTER — Telehealth: Payer: Self-pay | Admitting: Psychiatry

## 2021-07-24 ENCOUNTER — Other Ambulatory Visit: Payer: Self-pay

## 2021-07-24 DIAGNOSIS — F319 Bipolar disorder, unspecified: Secondary | ICD-10-CM

## 2021-07-24 MED ORDER — LITHIUM CARBONATE ER 450 MG PO TBCR: 450.0000 mg | EXTENDED_RELEASE_TABLET | Freq: Every day | ORAL | 0 refills | Status: DC

## 2021-07-24 NOTE — Telephone Encounter (Signed)
Checked patient's list of pharmacies and he had [facility] listed. Called them and they have it in stock. Called patient and asked if this pharmacy was ok or he would like sent to another pharmacy. He was ok with [facility].

## 2021-07-24 NOTE — Telephone Encounter (Signed)
Rx sent 

## 2021-07-24 NOTE — Telephone Encounter (Signed)
Pt LVM stating he tried to get his Lithium 450mg  filled through and [facility].  He was told they were out of stock and to ask his provider to choose something else.    Pls advise.   Next visit 10/31

## 2021-07-24 NOTE — Telephone Encounter (Signed)
Please call pt and find out if there is another pharmacy he can use

## 2021-07-31 ENCOUNTER — Telehealth: Payer: Self-pay | Admitting: Psychiatry

## 2021-07-31 ENCOUNTER — Other Ambulatory Visit: Payer: Self-pay

## 2021-07-31 ENCOUNTER — Encounter: Payer: Self-pay | Admitting: Psychiatry

## 2021-07-31 ENCOUNTER — Ambulatory Visit: Payer: Medicare Other | Admitting: Psychiatry

## 2021-07-31 DIAGNOSIS — Z79899 Other long term (current) drug therapy: Secondary | ICD-10-CM

## 2021-07-31 DIAGNOSIS — F4310 Post-traumatic stress disorder, unspecified: Secondary | ICD-10-CM

## 2021-07-31 DIAGNOSIS — F411 Generalized anxiety disorder: Secondary | ICD-10-CM

## 2021-07-31 DIAGNOSIS — F319 Bipolar disorder, unspecified: Secondary | ICD-10-CM

## 2021-07-31 NOTE — Progress Notes (Addendum)
Nathaniel Bush [id number] [birth year] 70 y.o.  Subjective:   [name] is a 70 y.o. (DOB [birth year]) male.  Chief Complaint:  Chief Complaint  Patient presents with   Follow-up   Bipolar affective disorder    HPI   Nathaniel Bush presents to the office today for follow-up of depression and anxiety.    seen in March 16, 2019.  It was relatively stable except for marital stress.  No meds were changed.  09/15/2019 appointment with the following noted: Had Aflutter with successful ablation. Overall mood good without change since here.  Nothing's changed.  Including meds.  Handling stress of socialization and Covid very well.  No unusual issues.. Really slowed down watching the news bc creating stress. Not taking much Xanax.  No problems with the lithium.  No meds were changed.  03/14/2020 appointment with the following noted: Back fusion Christmas.  Doing great with that.  Dr. Arnoldo Morale.  Riding bike in 3-4 weeks. 10 years ago would be down and thinking neg things when first awakens in morning and back that way again.  Stressor wife unhappy with her job and complains of it a lot and wife doesn't like things going on in the family relationships.  These don't help her dealing with him bc she doesn't treat him well.  Her work is getting better and that is helping so he's more hopeful.  Not really look for med changes. Rare Xanax helps him remain calm with wife and tolerates it well.  Plan: no med changes except suggest vit D in winter  04/14/20 TC: Patient called and said that over the last two weeks he has been hearing what people say to him but can't understand the words or what they mean. He said it was like his brain is in a fog. He also said that his right hand is shaking or have tremors. He wants to know is this the cause of the lithium . He just had a physical and all is good there MD response: Reduce lithium from 750 mg to 600   mg daily and see if that  helps.  06/14/2020 phone call patient stating he started feeling bad in the morning after reducing lithium to 600 mg daily and he increase it back to 750 mg.  He called complaining he was still having some depression.  It was suggested he move up his appointment.  09/12/20 appt with following noted: Currently back to 600 mg lithium.  Had gone back up to 750 mg.  Now doesn't think it  was meds making him feel bad and he got better and  Now back to 600 mg lithium for 6 weeks.  Doing well mood wise now.  Same marital issues and handling it as best he can.  Journaling is therapeutic and grounding.  Want to talk about it but she is unapproachable.  Thinking about counseling again.  [name].  Says he shows her his best side everyday.  She refuses to be close to him or touch him.   Rare Xanax. Tremor gone. Plan: Overall not depressed.  No med changes today  01/09/2021 appointment with the following noted: In jan taking lithium 450 and level 0.5. Then started feeling more stressed with wife and decided to increase to  Wife gotten meaner and wants to talk down to hip and resents that.  Xanax helps but not sure lithium helps.  Only used about once weekly. When initially wakes thinks of wife and gets down.  Can't  talk with wife bc she gets angry.  He feels powerless to do anything about it. He feels wife has deep seated unknown resentment against him but refuses to go to counseling with him. Increased lithium to 750 mg about Feb without any changes noticed.   Sleep good.   No excess alcohol. Plan: Continue lithium but need to check labs.  03/13/2021 appt noted: Disc marital situation.   She denied resentment against him.  But then started a litany of resentments for years.  Felt good he was right.  She started to try  be nicer. Journals and it helps. But still feels terrible.  Has done the housework.  She wants to travel and he doesn't want to do it if she doesn't care for him. More impatient with  drivers again lately.  I don't know how to take the edge off that.  Blows horn too much.  Can be patient in other circumstances.  Gets annoyed with stoplights.  Depressed about situation with wife.  Doesn't think he's overall depressed.  Still awakens thinking of marriage.  Says he didn't have a good attitude about taking meds in the past and willing to retry meds. No sex and wife asexual. Not depressed but frustrated with marriage. Patient reports stable mood and denies depressed or irritable moods.  Patient denies any recent difficulty with anxiety.  Patient denies difficulty with sleep initiation or maintenance. Denies appetite disturbance.  Patient reports that energy and motivation have been good.  Patient denies any difficulty with concentration.  Patient denies any suicidal ideation. Plan: Continue lithium 750 mg daily. Start sertraline 25 mg 1/2 daily for 1 week then 1 daily for irritability and depressive sx  03/29/2021 phone call complaining of GI distress from sertraline 25 mg daily.  He is very med sensitive so he was encouraged to try 12.5 mg daily.  07/31/21 appt noted: Sertraline for 2 weeks and stopped DT GI problems. Mood in general good.  Rad on bipolar disorder. Notices there's times he feels happy and does things he regrets the next day.  Not a big issue but at least I'm paying attention and notices.  Not going in as deep into depression.  Not as much letting wife getting to him. taking lithium 750 mg daily. Bummed he just turned 70 yo.  He's still biking. M had MI Seeing therapist Nathaniel Bush.   Still riding bike regularly.   Past psychiatric medication trials include Viibryd, venlafaxine, Zoloft, mirtazapine,  Viibryd which cause GI pain,Wellbutrin, duloxetine, Celexa, Lexapro, Wellbutrin, Trintellix. nefazodone,  inVega, Cerefolin NAC, Deplin,  Cytomel, lamotrigine, carbamazepine, Trileptal, risperidone, Abilify, buspirone,  trazodone 25 mg which cause manic symptoms,   propranolol   Lithium [id number] I am uncertain if he is taking Depakote before  Review of Systems:  Review of Systems  Cardiovascular:  Negative for palpitations.  Musculoskeletal:  Positive for arthralgias. Negative for back pain.  Neurological:  Positive for headaches. Negative for tremors and weakness.  Psychiatric/Behavioral:  Negative for agitation, behavioral problems, confusion, decreased concentration, dysphoric mood, hallucinations, self-injury, sleep disturbance and suicidal ideas. The patient is not nervous/anxious and is not hyperactive.    Medications: I have reviewed the patient's current medications.  Current Outpatient Medications  Medication Sig Dispense Refill   ALPRAZolam (XANAX) 0.25 MG tablet Take 1 tablet (0.25 mg total) by mouth 2 (two) times daily as needed for anxiety. 30 tablet 0   COVID-19 mRNA Vac-TriS, Pfizer, (PFIZER-BIONT COVID-19 VAC-TRIS) SUSP injection Inject into the muscle. 0.3 mL 0  lithium carbonate (ESKALITH) 450 MG CR tablet Take 1 tablet (450 mg total) by mouth daily. 90 tablet 0   lithium carbonate 300 MG capsule Take 2 capsules (600 mg total) by mouth daily. (Patient taking differently: Take 300 mg by mouth daily.) 180 capsule 1   No current facility-administered medications for this visit.    Medication Side Effects: None except mild GI  Allergies: No Known Allergies  Past Medical History:  Diagnosis Date   Depression    Dysrhythmia    aflutter   PAF (paroxysmal atrial fibrillation) (HCC)    Palpitations     Family History  Problem Relation Age of Onset   Atrial fibrillation Father        pacemaker   Heart disease Paternal Grandmother        pacemaker    Social History   Socioeconomic History   Marital status: Married    Spouse name: Not on file   Number of children: 2   Years of education: Not on file   Highest education level: Not on file  Occupational History   Not on file  Tobacco Use   Smoking status: Never    Smokeless tobacco: Never  Vaping Use   Vaping Use: Never used  Substance and Sexual Activity   Alcohol use: Yes    Alcohol/week: 3.0 standard drinks    Types: 2 Glasses of wine, 1 Cans of beer per week    Comment: daily   Drug use: Never   Sexual activity: Not on file  Other Topics Concern   Not on file  Social History Narrative   Lives at home with wife.  Retired Futures trader.    Social Determinants of Health   Financial Resource Strain: Not on file  Food Insecurity: Not on file  Transportation Needs: Not on file  Physical Activity: Not on file  Stress: Not on file  Social Connections: Not on file  Intimate Partner Violence: Not on file    Past Medical History, Surgical history, Social history, and Family history were reviewed and updated as appropriate.   W has no immediate plans to retire.  Please see review of systems for further details on the patient's review from today.   Objective:   Physical Exam:  There were no vitals taken for this visit.  Physical Exam Constitutional:      General: He is not in acute distress. Musculoskeletal:        General: No deformity.  Neurological:     Mental Status: He is alert and oriented to person, place, and time.     Coordination: Coordination normal.     Gait: Gait normal.  Psychiatric:        Attention and Perception: He is attentive.        Mood and Affect: Mood is not anxious or depressed. Affect is not labile, blunt, angry or inappropriate.        Speech: Speech normal.        Behavior: Behavior normal.        Thought Content: Thought content normal. Thought content does not include homicidal or suicidal ideation. Thought content does not include homicidal or suicidal plan.        Cognition and Memory: Cognition normal.        Judgment: Judgment normal.     Comments: Insight intact. No auditory or visual hallucinations.  Recent intrusive thoughts resolved.  Less morning depression. Less irritable.   Lab  Review:     Component Value  Date/Time   NA 137 10/01/2019 0323   NA 140 07/21/2019 1001   K 4.3 10/01/2019 0323   CL 105 10/01/2019 0323   CO2 26 10/01/2019 0323   GLUCOSE 162 (H) 10/01/2019 0323   BUN 15 10/01/2019 0323   BUN 15 07/21/2019 1001   CREATININE 0.85 10/01/2019 0323   CALCIUM 9.2 10/01/2019 0323   PROT 6.8 09/24/2019 0832   PROT 6.5 07/07/2019 1106   ALBUMIN 4.6 09/24/2019 0832   ALBUMIN 4.6 07/07/2019 1106   AST 20 09/24/2019 0832   ALT 22 09/24/2019 0832   ALKPHOS 43 09/24/2019 0832   BILITOT 1.7 (H) 09/24/2019 0832   BILITOT 1.5 (H) 07/07/2019 1106   GFRNONAA >60 10/01/2019 0323   GFRAA >60 10/01/2019 0323       Component Value Date/Time   WBC 13.2 (H) 10/01/2019 0323   RBC 3.92 (L) 10/01/2019 0323   HGB 12.3 (L) 10/01/2019 0323   HGB 14.7 07/21/2019 1001   HCT 35.5 (L) 10/01/2019 0323   HCT 42.0 07/21/2019 1001   PLT 133 (L) 10/01/2019 0323   PLT 189 07/21/2019 1001   MCV 90.6 10/01/2019 0323   MCV 90 07/21/2019 1001   MCH 31.4 10/01/2019 0323   MCHC 34.6 10/01/2019 0323   RDW 11.9 10/01/2019 0323   RDW 11.8 07/21/2019 1001   LYMPHSABS 1.5 09/24/2019 0832   LYMPHSABS 1.6 07/07/2019 1106   MONOABS 0.5 09/24/2019 0832   EOSABS 0.4 09/24/2019 0832   EOSABS 0.3 07/07/2019 1106   BASOSABS 0.1 09/24/2019 0832   BASOSABS 0.1 07/07/2019 1106    No results found for: POCLITH, LITHIUM   No results found for: PHENYTOIN, PHENOBARB, VALPROATE, CBMZ  Last lithium level May 13, 2018 on 750 mg lithium per day was 0.7  Lithium September 18, 2019= 0.8 Lithium 03/2020 = 0.72   Labs received did not dated April 07, 2019: Normal BMP with creatinine 1.0 calcium 9.6.  Normal CBC and lipid profile.  Normal TSH Vitamin D 36.9 Testosterone 7.6 with normal 6.6-18.1 Lithium 0.8  10/06/20 lithium 0.5 on 450 mg daily  03/08/2021 lithium level 0.8 on 750 mg daily Dr.Perini  .res Assessment: Plan:   Nathaniel Bush was seen today for follow-up and bipolar affective  disorder.  Diagnoses and all orders for this visit:  Bipolar affective disorder, rapid cycling (HCC)  Generalized anxiety disorder  PTSD (post-traumatic stress disorder)  Lithium use   Medication sensitive but tolerates lithium well.  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  He is medication sensitive and is not typically stable for very long. He has significant benefit from the lithium for his depression which is very helpful.    He acknowledges the benefit of the lithium overall and that his overall mood has been good for the last several months. Disc mood cycling and he's getting better at recognizing up cycles which are not severe. He tends to stay busy with retirement and cycles and it has helped.    Marital stresses chronic but no worse.  Stress dealing with wife.  Disc ways to deal with it given her refusal to go to counseling. He's starting counseling for this. Disc alcohol which is not a problme for him.  No med changes today and he agrees with the exception of adding vitamin D  Continue lithium 750 mg daily.  Lithium level per PCP. Pt says it was good but would like to get it.  Counseled patient regarding potential benefits, risks, and side effects of  lithium to include potential risk of lithium affecting thyroid and renal function.  Discussed need for periodic lab monitoring to determine drug level and to assess for potential adverse effects.  Counseled patient regarding signs and symptoms of lithium toxicity and advised that they notify office immediately or seek urgent medical attention if experiencing these signs and symptoms.  Patient advised to contact office with any questions or concerns.  Supportive therapy.    6 mos  Lynder Parents, MD, DFAPA  07/31/2021 addendum: Pt called in with his last lithium level result. Result obtained 05/10/21 and was 0.9.  Please see After Visit Summary for patient specific instructions.  No future  appointments.   No orders of the defined types were placed in this encounter.     -------------------------------

## 2021-07-31 NOTE — Telephone Encounter (Signed)
Pt called in with his last lithium level result. Result obtained 05/10/21 and was 0.9.

## 2021-09-28 ENCOUNTER — Other Ambulatory Visit: Payer: Self-pay | Admitting: Psychiatry

## 2021-09-28 DIAGNOSIS — F319 Bipolar disorder, unspecified: Secondary | ICD-10-CM

## 2021-10-04 ENCOUNTER — Ambulatory Visit
Admission: RE | Admit: 2021-10-04 | Discharge: 2021-10-04 | Disposition: A | Discharge status: Home / Self Care | Payer: Medicare Other | Source: Ambulatory Visit | Attending: Internal Medicine | Admitting: Internal Medicine

## 2021-10-04 DIAGNOSIS — R918 Other nonspecific abnormal finding of lung field: Secondary | ICD-10-CM

## 2021-10-19 ENCOUNTER — Other Ambulatory Visit: Payer: Self-pay

## 2021-10-19 ENCOUNTER — Encounter: Payer: Self-pay | Admitting: Psychiatry

## 2021-10-19 ENCOUNTER — Ambulatory Visit (INDEPENDENT_AMBULATORY_CARE_PROVIDER_SITE_OTHER): Payer: Medicare Other | Admitting: Psychiatry

## 2021-10-19 DIAGNOSIS — Z79899 Other long term (current) drug therapy: Secondary | ICD-10-CM

## 2021-10-19 DIAGNOSIS — F4310 Post-traumatic stress disorder, unspecified: Secondary | ICD-10-CM

## 2021-10-19 DIAGNOSIS — F411 Generalized anxiety disorder: Secondary | ICD-10-CM | POA: Diagnosis not present

## 2021-10-19 DIAGNOSIS — F319 Bipolar disorder, unspecified: Secondary | ICD-10-CM

## 2021-10-19 NOTE — Progress Notes (Signed)
Nathaniel Bush [id number] July 28, 1951 71 y.o.  Subjective:   [name] is a 71 y.o. (DOB June 12, 1951) male.  Chief Complaint:  Chief Complaint  Patient presents with   Follow-up   Bipolar affective disorder, rapid cycling     HPI   Nathaniel Bush presents to the office today for follow-up of depression and anxiety.    seen in March 16, 2019.  It was relatively stable except for marital stress.  No meds were changed.  09/15/2019 appointment with the following noted: Had Aflutter with successful ablation. Overall mood good without change since here.  Nothing's changed.  Including meds.  Handling stress of socialization and Covid very well.  No unusual issues.. Really slowed down watching the news bc creating stress. Not taking much Xanax.  No problems with the lithium.  No meds were changed.  03/14/2020 appointment with the following noted: Back fusion Christmas.  Doing great with that.  Dr. Lovell Sheehan.  Riding bike in 3-4 weeks. 10 years ago would be down and thinking neg things when first awakens in morning and back that way again.  Stressor wife unhappy with her job and complains of it a lot and wife doesn't like things going on in the family relationships.  These don't help her dealing with him bc she doesn't treat him well.  Her work is getting better and that is helping so he's more hopeful.  Not really look for med changes. Rare Xanax helps him remain calm with wife and tolerates it well.  Plan: no med changes except suggest vit D in winter  04/14/20 TC: Patient called and said that over the last two weeks he has been hearing what people say to him but can't understand the words or what they mean. He said it was like his brain is in a fog. He also said that his right hand is shaking or have tremors. He wants to know is this the cause of the lithium . He just had a physical and all is good there MD response: Reduce lithium from 750 mg to 600   mg daily and see if that  helps.  06/14/2020 phone call patient stating he started feeling bad in the morning after reducing lithium to 600 mg daily and he increase it back to 750 mg.  He called complaining he was still having some depression.  It was suggested he move up his appointment.  09/12/20 appt with following noted: Currently back to 600 mg lithium.  Had gone back up to 750 mg.  Now doesn't think it  was meds making him feel bad and he got better and  Now back to 600 mg lithium for 6 weeks.  Doing well mood wise now.  Same marital issues and handling it as best he can.  Journaling is therapeutic and grounding.  Want to talk about it but she is unapproachable.  Thinking about counseling again.  [name].  Says he shows her his best side everyday.  She refuses to be close to him or touch him.   Rare Xanax. Tremor gone. Plan: Overall not depressed.  No med changes today  01/09/2021 appointment with the following noted: In jan taking lithium 450 and level 0.5. Then started feeling more stressed with wife and decided to increase to  Wife gotten meaner and wants to talk down to hip and resents that.  Xanax helps but not sure lithium helps.  Only used about once weekly. When initially wakes thinks of wife and gets  down.  Can't talk with wife bc she gets angry.  He feels powerless to do anything about it. He feels wife has deep seated unknown resentment against him but refuses to go to counseling with him. Increased lithium to 750 mg about Feb without any changes noticed.   Sleep good.   No excess alcohol. Plan: Continue lithium but need to check labs.  03/13/2021 appt noted: Disc marital situation.   She denied resentment against him.  But then started a litany of resentments for years.  Felt good he was right.  She started to try  be nicer. Journals and it helps. But still feels terrible.  Has done the housework.  She wants to travel and he doesn't want to do it if she doesn't care for him. More impatient with  drivers again lately.  I don't know how to take the edge off that.  Blows horn too much.  Can be patient in other circumstances.  Gets annoyed with stoplights.  Depressed about situation with wife.  Doesn't think he's overall depressed.  Still awakens thinking of marriage.  Says he didn't have a good attitude about taking meds in the past and willing to retry meds. No sex and wife asexual. Not depressed but frustrated with marriage. Patient reports stable mood and denies depressed or irritable moods.  Patient denies any recent difficulty with anxiety.  Patient denies difficulty with sleep initiation or maintenance. Denies appetite disturbance.  Patient reports that energy and motivation have been good.  Patient denies any difficulty with concentration.  Patient denies any suicidal ideation. Plan: Continue lithium 750 mg daily. Start sertraline 25 mg 1/2 daily for 1 week then 1 daily for irritability and depressive sx  03/29/2021 phone call complaining of GI distress from sertraline 25 mg daily.  He is very med sensitive so he was encouraged to try 12.5 mg daily.  07/31/21 appt noted: Sertraline for 2 weeks and stopped DT GI problems. Mood in general good.  Rad on bipolar disorder. Notices there's times he feels happy and does things he regrets the next day.  Not a big issue but at least I'm paying attention and notices.  Not going in as deep into depression.  Not as much letting wife getting to him. taking lithium 750 mg daily. Bummed he just turned 71 yo.  He's still biking. M had MI Seeing therapist Nathaniel Bush. No med changes today and he agrees with the exception of adding vitamin D  Continue lithium 750 mg daily.  10/19/21 appt ntoed: Lithium 0.8 Dr. Pierre Bali office Mild intermittent intentional tremor.  Not severe. Real emotional through the holidays, overly, intense. Sentimental with movie.  Also get very mad but not at people but at things when doing projects.  Always polite in  public. Therapy, Nathaniel Bush,  has helped improve how he handles Nathaniel Bush, coping better with it. Anxiety in waves.  A lot of stress with back pain with injections 1 week ago helped. Only thing that works is 1/2 Xanax prn but still using bottle from April will work in 15 mins.  Still riding bike regularly.   Past psychiatric medication trials include : venlafaxine, Zoloft, mirtazapine,  Viibryd which cause GI pain,Wellbutrin, duloxetine, Celexa, Lexapro, Wellbutrin, Trintellix.  Sertraline, nefazodone,  inVega, risperidone, Abilify,  Cerefolin NAC, Deplin,  Cytomel, lamotrigine, carbamazepine, Trileptal, buspirone,  propranolol   trazodone 25 mg which cause manic symptoms,   Lithium 600-750 I am uncertain if he is taking Depakote before  Review of Systems:  Review of Systems  Musculoskeletal:  Positive for arthralgias and back pain.  Neurological:  Positive for headaches. Negative for tremors and weakness.  Psychiatric/Behavioral:  Negative for agitation, behavioral problems, confusion, decreased concentration, dysphoric mood, hallucinations, self-injury, sleep disturbance and suicidal ideas. The patient is not nervous/anxious and is not hyperactive.    Medications: I have reviewed the patient's current medications.  Current Outpatient Medications  Medication Sig Dispense Refill   ALPRAZolam (XANAX) 0.25 MG tablet Take 1 tablet (0.25 mg total) by mouth 2 (two) times daily as needed for anxiety. 30 tablet 0   lithium carbonate (ESKALITH) 450 MG CR tablet Take 1 tablet (450 mg total) by mouth daily. 90 tablet 0   lithium carbonate 300 MG capsule Take 2 capsules (600 mg total) by mouth daily. (Patient taking differently: Take 300 mg by mouth daily. Takes one tablet for a total of 750.) 180 capsule 0   COVID-19 mRNA Vac-TriS, Pfizer, (PFIZER-BIONT COVID-19 VAC-TRIS) SUSP injection Inject into the muscle. (Patient not taking: Reported on 10/19/2021) 0.3 mL 0   No current facility-administered  medications for this visit.    Medication Side Effects: None except mild GI  Allergies: No Known Allergies  Past Medical History:  Diagnosis Date   Depression    Dysrhythmia    aflutter   PAF (paroxysmal atrial fibrillation) (HCC)    Palpitations     Family History  Problem Relation Age of Onset   Atrial fibrillation Father        pacemaker   Heart disease Paternal Grandmother        pacemaker    Social History   Socioeconomic History   Marital status: Married    Spouse name: Not on file   Number of children: 2   Years of education: Not on file   Highest education level: Not on file  Occupational History   Not on file  Tobacco Use   Smoking status: Never   Smokeless tobacco: Never  Vaping Use   Vaping Use: Never used  Substance and Sexual Activity   Alcohol use: Yes    Alcohol/week: 3.0 standard drinks    Types: 2 Glasses of wine, 1 Cans of beer per week    Comment: daily   Drug use: Never   Sexual activity: Not on file  Other Topics Concern   Not on file  Social History Narrative   Lives at home with wife.  Retired Futures traderpolice detective.    Social Determinants of Health   Financial Resource Strain: Not on file  Food Insecurity: Not on file  Transportation Needs: Not on file  Physical Activity: Not on file  Stress: Not on file  Social Connections: Not on file  Intimate Partner Violence: Not on file    Past Medical History, Surgical history, Social history, and Family history were reviewed and updated as appropriate.   W has no immediate plans to retire.  Please see review of systems for further details on the patient's review from today.   Objective:   Physical Exam:  There were no vitals taken for this visit.  Physical Exam Constitutional:      General: He is not in acute distress. Musculoskeletal:        General: No deformity.  Neurological:     Mental Status: He is alert and oriented to person, place, and time.     Coordination:  Coordination normal.     Gait: Gait normal.  Psychiatric:        Attention and Perception:  He is attentive.        Mood and Affect: Mood is not anxious or depressed. Affect is not labile, blunt, angry or inappropriate.        Speech: Speech normal.        Behavior: Behavior normal.        Thought Content: Thought content normal. Thought content is not delusional. Thought content does not include homicidal or suicidal ideation. Thought content does not include suicidal plan.        Cognition and Memory: Cognition normal.        Judgment: Judgment normal.     Comments: Insight intact. No auditory or visual hallucinations.  Less irritable.   Lab Review:     Component Value Date/Time   NA 137 10/01/2019 0323   NA 140 07/21/2019 1001   K 4.3 10/01/2019 0323   CL 105 10/01/2019 0323   CO2 26 10/01/2019 0323   GLUCOSE 162 (H) 10/01/2019 0323   BUN 15 10/01/2019 0323   BUN 15 07/21/2019 1001   CREATININE 0.85 10/01/2019 0323   CALCIUM 9.2 10/01/2019 0323   PROT 6.8 09/24/2019 0832   PROT 6.5 07/07/2019 1106   ALBUMIN 4.6 09/24/2019 0832   ALBUMIN 4.6 07/07/2019 1106   AST 20 09/24/2019 0832   ALT 22 09/24/2019 0832   ALKPHOS 43 09/24/2019 0832   BILITOT 1.7 (H) 09/24/2019 0832   BILITOT 1.5 (H) 07/07/2019 1106   GFRNONAA >60 10/01/2019 0323   GFRAA >60 10/01/2019 0323       Component Value Date/Time   WBC 13.2 (H) 10/01/2019 0323   RBC 3.92 (L) 10/01/2019 0323   HGB 12.3 (L) 10/01/2019 0323   HGB 14.7 07/21/2019 1001   HCT 35.5 (L) 10/01/2019 0323   HCT 42.0 07/21/2019 1001   PLT 133 (L) 10/01/2019 0323   PLT 189 07/21/2019 1001   MCV 90.6 10/01/2019 0323   MCV 90 07/21/2019 1001   MCH 31.4 10/01/2019 0323   MCHC 34.6 10/01/2019 0323   RDW 11.9 10/01/2019 0323   RDW 11.8 07/21/2019 1001   LYMPHSABS 1.5 09/24/2019 0832   LYMPHSABS 1.6 07/07/2019 1106   MONOABS 0.5 09/24/2019 0832   EOSABS 0.4 09/24/2019 0832   EOSABS 0.3 07/07/2019 1106   BASOSABS 0.1 09/24/2019  0832   BASOSABS 0.1 07/07/2019 1106    No results found for: POCLITH, LITHIUM   No results found for: PHENYTOIN, PHENOBARB, VALPROATE, CBMZ  Last lithium level May 13, 2018 on 750 mg lithium per day was 0.7  Lithium September 18, 2019= 0.8 Lithium 03/2020 = 0.72   Labs received did not dated April 07, 2019: Normal BMP with creatinine 1.0 calcium 9.6.  Normal CBC and lipid profile.  Normal TSH Vitamin D 36.9 Testosterone 7.6 with normal 6.6-18.1 Lithium 0.8  10/06/20 lithium 0.5 on 450 mg daily  03/08/2021 lithium level 0.8 on 750 mg daily Dr.Perini  Lithium 0.8 Dr. Pierre Bali office again since here.  Not in Epic  .res Assessment: Plan:   Nathaniel Bush was seen today for follow-up and bipolar affective disorder, rapid cycling .  Diagnoses and all orders for this visit:  Bipolar affective disorder, rapid cycling (HCC)  Generalized anxiety disorder  PTSD (post-traumatic stress disorder)  Lithium use    Medication sensitive but tolerates lithium well.  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  He is medication sensitive and is not typically stable for very long. He has significant benefit from the lithium for his depression which is  very helpful.    He acknowledges the benefit of the lithium overall and that his overall mood has been good for the last several months. Disc mood cycling and he's getting better at recognizing up cycles which are not severe. He tends to stay busy with retirement and cycles and it has helped.   Overall doing well.  He has tried multiple medications and lithium has been the best.  He seeks to perfectly handle his emotions but that is not realistic and that was discussed.  Marital stresses chronic but no worse.  Stress dealing with wife.  Disc ways to deal with it given her refusal to go to counseling. He's starting counseling for this. Disc alcohol which is not a problme for him.  No med changes today and he agrees with the exception  of adding vitamin D  Continue lithium 750 mg daily.  Lithium level per PCP. Pt says it was good but would like to get it.  Counseled patient regarding potential benefits, risks, and side effects of lithium to include potential risk of lithium affecting thyroid and renal function.  Discussed need for periodic lab monitoring to determine drug level and to assess for potential adverse effects.  Counseled patient regarding signs and symptoms of lithium toxicity and advised that they notify office immediately or seek urgent medical attention if experiencing these signs and symptoms.  Patient advised to contact office with any questions or concerns.  Consider Rexulti, Vraylar, clonidine  Supportive therapy.    6 mos  Nathaniel Staggersarey Cottle, MD, DFAPA  07/31/2021 addendum: Pt called in with his last lithium level result. Result obtained 05/10/21 and was 0.9.  Please see After Visit Summary for patient specific instructions.  Future Appointments  Date Time Provider Department Center  01/29/2022  1:30 PM Bush, Steva Readyarey G Jr., MD [facility] None  03/07/2022 11:00 AM Bush, Steva Readyarey G Jr., MD [facility] None     No orders of the defined types were placed in this encounter.      -------------------------------

## 2021-10-20 ENCOUNTER — Telehealth: Payer: Self-pay | Admitting: Psychiatry

## 2021-10-20 ENCOUNTER — Other Ambulatory Visit: Payer: Self-pay

## 2021-10-20 DIAGNOSIS — F411 Generalized anxiety disorder: Secondary | ICD-10-CM

## 2021-10-20 MED ORDER — ALPRAZOLAM 0.25 MG PO TABS: 0.2500 mg | ORAL_TABLET | Freq: Two times a day (BID) | ORAL | 4 refills | Status: DC | PRN

## 2021-10-20 NOTE — Telephone Encounter (Signed)
Pended.

## 2021-10-20 NOTE — Telephone Encounter (Signed)
Patient was seen in office on yesterday. He is requesting a Xanax refill sent to the [facility]. Direct any additional information needed to # [phone].

## 2021-11-13 ENCOUNTER — Other Ambulatory Visit: Payer: Self-pay

## 2021-11-13 ENCOUNTER — Telehealth: Payer: Self-pay | Admitting: Psychiatry

## 2021-11-13 DIAGNOSIS — F319 Bipolar disorder, unspecified: Secondary | ICD-10-CM

## 2021-11-13 MED ORDER — LITHIUM CARBONATE ER 450 MG PO TBCR: 450.0000 mg | EXTENDED_RELEASE_TABLET | Freq: Every day | ORAL | 0 refills | Status: DC

## 2021-11-13 NOTE — Telephone Encounter (Signed)
Pt  lvm that he needs a refill on his lithium 450 mg sent to [facility]. He jut saw Dr. Jennelle Human in january

## 2021-11-13 NOTE — Telephone Encounter (Signed)
Rx sent 

## 2022-01-22 ENCOUNTER — Other Ambulatory Visit: Payer: Self-pay | Admitting: Psychiatry

## 2022-01-22 DIAGNOSIS — F319 Bipolar disorder, unspecified: Secondary | ICD-10-CM

## 2022-01-25 ENCOUNTER — Telehealth: Payer: Self-pay | Admitting: Psychiatry

## 2022-01-25 NOTE — Telephone Encounter (Signed)
Wynona Canes, Pharm tech at [facility], LVM on 4/26 ?@ 4:32p.  She wants to know if the script they have for the Lithium Carbonate 450mg  tablets are to replace the 300mg  capsules. ? ?Next appt 5/1  ?

## 2022-01-25 NOTE — Telephone Encounter (Signed)
Patient is taking 750 mg. Notified pharmacy.  ?

## 2022-01-29 ENCOUNTER — Ambulatory Visit: Payer: Medicare Other | Admitting: Psychiatry

## 2022-02-22 ENCOUNTER — Other Ambulatory Visit: Payer: Self-pay | Admitting: Neurosurgery

## 2022-02-22 DIAGNOSIS — G8929 Other chronic pain: Secondary | ICD-10-CM

## 2022-02-28 ENCOUNTER — Ambulatory Visit
Admission: RE | Admit: 2022-02-28 | Discharge: 2022-02-28 | Disposition: A | Discharge status: Home / Self Care | Payer: Medicare Other | Source: Ambulatory Visit | Attending: Neurosurgery | Admitting: Neurosurgery

## 2022-02-28 DIAGNOSIS — G8929 Other chronic pain: Secondary | ICD-10-CM

## 2022-02-28 MED ORDER — MEPERIDINE HCL 50 MG/ML IJ SOLN: 50.0000 mg | Freq: Once | INTRAMUSCULAR | Status: DC | PRN

## 2022-02-28 MED ORDER — DIAZEPAM 5 MG PO TABS
5.0000 mg | ORAL_TABLET | Freq: Once | ORAL | Status: AC
  Administered 2022-02-28: 5 mg via ORAL

## 2022-02-28 MED ORDER — ONDANSETRON HCL 4 MG/2ML IJ SOLN: 4.0000 mg | Freq: Once | INTRAMUSCULAR | Status: DC | PRN

## 2022-02-28 MED ORDER — IOPAMIDOL (ISOVUE-M 200) INJECTION 41%
20.0000 mL | Freq: Once | INTRAMUSCULAR | Status: AC
  Administered 2022-02-28: 20 mL via INTRATHECAL

## 2022-02-28 NOTE — Discharge Instructions (Signed)

## 2022-03-07 ENCOUNTER — Ambulatory Visit: Payer: Medicare Other | Admitting: Psychiatry

## 2022-03-15 ENCOUNTER — Other Ambulatory Visit: Payer: Self-pay | Admitting: Neurosurgery

## 2022-03-26 ENCOUNTER — Telehealth (INDEPENDENT_AMBULATORY_CARE_PROVIDER_SITE_OTHER): Payer: Medicare Other | Admitting: Psychiatry

## 2022-03-26 ENCOUNTER — Encounter: Payer: Self-pay | Admitting: Psychiatry

## 2022-03-26 DIAGNOSIS — Z79899 Other long term (current) drug therapy: Secondary | ICD-10-CM

## 2022-03-26 DIAGNOSIS — F319 Bipolar disorder, unspecified: Secondary | ICD-10-CM | POA: Diagnosis not present

## 2022-03-26 DIAGNOSIS — F4310 Post-traumatic stress disorder, unspecified: Secondary | ICD-10-CM

## 2022-03-26 DIAGNOSIS — F411 Generalized anxiety disorder: Secondary | ICD-10-CM

## 2022-03-26 MED ORDER — LITHIUM CARBONATE ER 450 MG PO TBCR: 450.0000 mg | EXTENDED_RELEASE_TABLET | Freq: Every day | ORAL | 1 refills | Status: DC

## 2022-03-26 MED ORDER — LITHIUM CARBONATE 300 MG PO CAPS
600.0000 mg | ORAL_CAPSULE | Freq: Every day | ORAL | 1 refills | Status: DC
Start: 2022-03-26 — End: 2022-05-17

## 2022-03-26 NOTE — Progress Notes (Signed)
Nathaniel Bush [id number] 06/17/1951 71 y.o.  Subjective:   [name] is a 71 y.o. (DOB 1951/08/19) male.  Chief Complaint:  Chief Complaint  Patient presents with   Follow-up   Depression    HPI   Nathaniel Bush presents to the office today for follow-up of depression and anxiety.    seen in March 16, 2019.  It was relatively stable except for marital stress.  No meds were changed.  09/15/2019 appointment with the following noted: Had Aflutter with successful ablation. Overall mood good without change since here.  Nothing's changed.  Including meds.  Handling stress of socialization and Covid very well.  No unusual issues.. Really slowed down watching the news bc creating stress. Not taking much Xanax.  No problems with the lithium.  No meds were changed.  03/14/2020 appointment with the following noted: Back fusion Christmas.  Doing great with that.  Dr. Lovell Bush.  Riding bike in 3-4 weeks. 10 years ago would be down and thinking neg things when first awakens in morning and back that way again.  Stressor wife unhappy with her job and complains of it a lot and wife doesn't like things going on in the family relationships.  These don't help her dealing with him bc she doesn't treat him well.  Her work is getting better and that is helping so he's more hopeful.  Not really look for med changes. Rare Xanax helps him remain calm with wife and tolerates it well.  Plan: no med changes except suggest vit D in winter  04/14/20 TC: Patient called and said that over the last two weeks he has been hearing what people say to him but can't understand the words or what they mean. He said it was like his brain is in a fog. He also said that his right hand is shaking or have tremors. He wants to know is this the cause of the lithium . He just had a physical and all is good there MD response: Reduce lithium from 750 mg to 600   mg daily and see if that helps.  06/14/2020 phone call  patient stating he started feeling bad in the morning after reducing lithium to 600 mg daily and he increase it back to 750 mg.  He called complaining he was still having some depression.  It was suggested he move up his appointment.  09/12/20 appt with following noted: Currently back to 600 mg lithium.  Had gone back up to 750 mg.  Now doesn't think it  was meds making him feel bad and he got better and  Now back to 600 mg lithium for 6 weeks.  Doing well mood wise now.  Same marital issues and handling it as best he can.  Journaling is therapeutic and grounding.  Want to talk about it but she is unapproachable.  Thinking about counseling again.  [name].  Says he shows her his best side everyday.  She refuses to be close to him or touch him.   Rare Xanax. Tremor gone. Plan: Overall not depressed.  No med changes today  01/09/2021 appointment with the following noted: In jan taking lithium 450 and level 0.5. Then started feeling more stressed with wife and decided to increase to  Wife gotten meaner and wants to talk down to hip and resents that.  Xanax helps but not sure lithium helps.  Only used about once weekly. When initially wakes thinks of wife and gets down.  Can't talk with  wife bc she gets angry.  He feels powerless to do anything about it. He feels wife has deep seated unknown resentment against him but refuses to go to counseling with him. Increased lithium to 750 mg about Feb without any changes noticed.   Sleep good.   No excess alcohol. Plan: Continue lithium but need to check labs.  03/13/2021 appt noted: Disc marital situation.   She denied resentment against him.  But then started a litany of resentments for years.  Felt good he was right.  She started to try  be nicer. Journals and it helps. But still feels terrible.  Has done the housework.  She wants to travel and he doesn't want to do it if she doesn't care for him. More impatient with drivers again lately.  I don't  know how to take the edge off that.  Blows horn too much.  Can be patient in other circumstances.  Gets annoyed with stoplights.  Depressed about situation with wife.  Doesn't think he's overall depressed.  Still awakens thinking of marriage.  Says he didn't have a good attitude about taking meds in the past and willing to retry meds. No sex and wife asexual. Not depressed but frustrated with marriage. Patient reports stable mood and denies depressed or irritable moods.  Patient denies any recent difficulty with anxiety.  Patient denies difficulty with sleep initiation or maintenance. Denies appetite disturbance.  Patient reports that energy and motivation have been good.  Patient denies any difficulty with concentration.  Patient denies any suicidal ideation. Plan: Continue lithium 750 mg daily. Start sertraline 25 mg 1/2 daily for 1 week then 1 daily for irritability and depressive sx  03/29/2021 phone call complaining of GI distress from sertraline 25 mg daily.  He is very med sensitive so he was encouraged to try 12.5 mg daily.  07/31/21 appt noted: Sertraline for 2 weeks and stopped DT GI problems. Mood in general good.  Rad on bipolar disorder. Notices there's times he feels happy and does things he regrets the next day.  Not a big issue but at least I'm paying attention and notices.  Not going in as deep into depression.  Not as much letting wife getting to him. taking lithium 750 mg daily. Bummed he just turned 71 yo.  He's still biking. M had MI Seeing therapist Nathaniel Bush. No med changes today and he agrees with the exception of adding vitamin D  Continue lithium 750 mg daily.  10/19/21 appt ntoed: Lithium 0.8 Dr. Pierre Bush office Mild intermittent intentional tremor.  Not severe. Real emotional through the holidays, overly, intense. Sentimental with movie.  Also get very mad but not at people but at things when doing projects.  Always polite in public. Therapy, Nathaniel Bush,  has  helped improve how he handles Nathaniel Bush, coping better with it. Anxiety in waves.  A lot of stress with back pain with injections 1 week ago helped. Only thing that works is 1/2 Xanax prn but still using bottle from April will work in 15 mins. Plan: No med changes today and he agrees with the exception of adding vitamin D  Continue lithium 750 mg daily. Lithium level per PCP. Pt says it was good but would like to get it.  03/26/22 appt noted: Tested positive for Covid 2 weeks ago. No severe sx. Planning back surgery.  No Covid SX. Mood good last week. Therapist hleped a lot with coping with conflict with Nathaniel Bush.  Handling it better usually.  She  gets into resentment about things 40 years ago.  She has a list of resentments.  She weaponizes it.  Really pleased with therapy helpful. She never says "I'm sorry". Spero Geralds, PhD.  Then he gets upset and tremors then and gets anxious then. Uses Xanax prn. Tolerates it. Still rides bike and takes care of his mother and functions well. Tremor otherwise manageable unless upset.  Still riding bike regularly.   Past psychiatric medication trials include : venlafaxine, Zoloft, mirtazapine,  Viibryd which cause GI pain, Wellbutrin, duloxetine, Celexa, Lexapro, Wellbutrin, Trintellix.  Sertraline, nefazodone,  inVega, risperidone, Abilify,  lamotrigine, carbamazepine, Trileptal,  Cerefolin NAC, Deplin,  Cytomel, buspirone,  propranolol   trazodone 25 mg which cause manic symptoms,   Lithium 600-750 I am uncertain if he is taking Depakote before  Review of Systems:  Review of Systems  Musculoskeletal:  Positive for arthralgias and back pain.  Neurological:  Positive for tremors and headaches. Negative for weakness.  Psychiatric/Behavioral:  Negative for agitation, behavioral problems, confusion, decreased concentration, dysphoric mood, hallucinations, self-injury, sleep disturbance and suicidal ideas. The patient is not nervous/anxious and is not  hyperactive.     Medications: I have reviewed the patient's current medications.  Current Outpatient Medications  Medication Sig Dispense Refill   acetaminophen (TYLENOL) 500 MG tablet Take 1,000 mg by mouth every 6 (six) hours as needed (pain.).     ALPRAZolam (XANAX) 0.25 MG tablet Take 1 tablet (0.25 mg total) by mouth 2 (two) times daily as needed for anxiety. 30 tablet 4   COVID-19 mRNA Vac-TriS, Pfizer, (PFIZER-BIONT COVID-19 VAC-TRIS) SUSP injection Inject into the muscle. 0.3 mL 0   ibuprofen (ADVIL) 200 MG tablet Take 400 mg by mouth every 8 (eight) hours as needed (pain.).     lithium carbonate (ESKALITH) 450 MG CR tablet Take 1 tablet (450 mg total) by mouth daily. 90 tablet 1   lithium carbonate 300 MG capsule Take 2 capsules (600 mg total) by mouth daily. 180 capsule 1   No current facility-administered medications for this visit.    Medication Side Effects: None except mild GI  Allergies: No Known Allergies  Past Medical History:  Diagnosis Date   Depression    Dysrhythmia    aflutter   PAF (paroxysmal atrial fibrillation) (HCC)    Palpitations     Family History  Problem Relation Age of Onset   Atrial fibrillation Father        pacemaker   Heart disease Paternal Grandmother        pacemaker    Social History   Socioeconomic History   Marital status: Married    Spouse name: Not on file   Number of children: 2   Years of education: Not on file   Highest education level: Not on file  Occupational History   Not on file  Tobacco Use   Smoking status: Never   Smokeless tobacco: Never  Vaping Use   Vaping Use: Never used  Substance and Sexual Activity   Alcohol use: Yes    Alcohol/week: 3.0 standard drinks of alcohol    Types: 2 Glasses of wine, 1 Cans of beer per week    Comment: daily   Drug use: Never   Sexual activity: Not on file  Other Topics Concern   Not on file  Social History Narrative   Lives at home with wife.  Retired Biomedical engineer.    Social Determinants of Health   Financial Resource Strain: Not on file  Food  Insecurity: Not on file  Transportation Needs: Not on file  Physical Activity: Not on file  Stress: Not on file  Social Connections: Not on file  Intimate Partner Violence: Not on file    Past Medical History, Surgical history, Social history, and Family history were reviewed and updated as appropriate.   W has no immediate plans to retire.  Please see review of systems for further details on the patient's review from today.   Objective:   Physical Exam:  There were no vitals taken for this visit.  Physical Exam Constitutional:      General: He is not in acute distress. Musculoskeletal:        General: No deformity.  Neurological:     Mental Status: He is alert and oriented to person, place, and time.     Coordination: Coordination normal.     Gait: Gait normal.  Psychiatric:        Attention and Perception: He is attentive.        Mood and Affect: Mood is not anxious or depressed. Affect is not labile, blunt, angry or inappropriate.        Speech: Speech normal.        Behavior: Behavior normal.        Thought Content: Thought content normal. Thought content is not delusional. Thought content does not include homicidal or suicidal ideation. Thought content does not include suicidal plan.        Cognition and Memory: Cognition normal.        Judgment: Judgment normal.     Comments: Insight intact. No auditory or visual hallucinations.  Less irritable.    Lab Review:     Component Value Date/Time   NA 137 10/01/2019 0323   NA 140 07/21/2019 1001   K 4.3 10/01/2019 0323   CL 105 10/01/2019 0323   CO2 26 10/01/2019 0323   GLUCOSE 162 (H) 10/01/2019 0323   BUN 15 10/01/2019 0323   BUN 15 07/21/2019 1001   CREATININE 0.85 10/01/2019 0323   CALCIUM 9.2 10/01/2019 0323   PROT 6.8 09/24/2019 0832   PROT 6.5 07/07/2019 1106   ALBUMIN 4.6 09/24/2019 0832   ALBUMIN 4.6  07/07/2019 1106   AST 20 09/24/2019 0832   ALT 22 09/24/2019 0832   ALKPHOS 43 09/24/2019 0832   BILITOT 1.7 (H) 09/24/2019 0832   BILITOT 1.5 (H) 07/07/2019 1106   GFRNONAA >60 10/01/2019 0323   GFRAA >60 10/01/2019 0323       Component Value Date/Time   WBC 13.2 (H) 10/01/2019 0323   RBC 3.92 (L) 10/01/2019 0323   HGB 12.3 (L) 10/01/2019 0323   HGB 14.7 07/21/2019 1001   HCT 35.5 (L) 10/01/2019 0323   HCT 42.0 07/21/2019 1001   PLT 133 (L) 10/01/2019 0323   PLT 189 07/21/2019 1001   MCV 90.6 10/01/2019 0323   MCV 90 07/21/2019 1001   MCH 31.4 10/01/2019 0323   MCHC 34.6 10/01/2019 0323   RDW 11.9 10/01/2019 0323   RDW 11.8 07/21/2019 1001   LYMPHSABS 1.5 09/24/2019 0832   LYMPHSABS 1.6 07/07/2019 1106   MONOABS 0.5 09/24/2019 0832   EOSABS 0.4 09/24/2019 0832   EOSABS 0.3 07/07/2019 1106   BASOSABS 0.1 09/24/2019 0832   BASOSABS 0.1 07/07/2019 1106    No results found for: "POCLITH", "LITHIUM"   No results found for: "PHENYTOIN", "PHENOBARB", "VALPROATE", "CBMZ"  Last lithium level May 13, 2018 on 750 mg lithium per day was 0.7  Lithium September 18, 2019= 0.8 Lithium 03/2020 = 0.72   Labs received did not dated April 07, 2019: Normal BMP with creatinine 1.0 calcium 9.6.  Normal CBC and lipid profile.  Normal TSH Vitamin D 36.9 Testosterone 7.6 with normal 6.6-18.1 Lithium 0.8  10/06/20 lithium 0.5 on 450 mg daily  03/08/2021 lithium level 0.8 on 750 mg daily Dr.Perini  Lithium 0.8 Dr. Pierre Bush office again since here on 750 mg daily.  Jan 2023  .res Assessment: Plan:   Kijuan was seen today for follow-up and depression.  Diagnoses and all orders for this visit:  Bipolar affective disorder, rapid cycling (HCC) -     lithium carbonate (ESKALITH) 450 MG CR tablet; Take 1 tablet (450 mg total) by mouth daily. -     lithium carbonate 300 MG capsule; Take 2 capsules (600 mg total) by mouth daily.  Generalized anxiety disorder  PTSD (post-traumatic stress  disorder)  Lithium use    Medication sensitive but tolerates lithium well.  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  He is medication sensitive and is not typically stable for very long. He has significant benefit from the lithium for his depression which is very helpful.    He acknowledges the benefit of the lithium overall and that his overall mood has been good for the last several months. Disc mood cycling and he's getting better at recognizing up cycles which are not severe. He tends to stay busy with retirement and cycles and it has helped.   Overall doing well.  He has tried multiple medications and lithium has been the best.  He seeks to perfectly handle his emotions but that is not realistic and that was discussed.  Marital stresses chronic but no worse.  Stress dealing with wife.  Disc ways to deal with it given her refusal to go to counseling. He's startied counseling for this.  And it is helping him.  We discussed how it might potentially impact his wife based on systems therapy over a period of time Disc alcohol which is not a problme for him.  No med changes today and he agrees with the exception of adding vitamin D  Continue lithium 750 mg daily.  Lithium level per PCP.  Lithium 0.8 Dr. Perrini's office again since here on 750 mg daily.  Jan 2023  Counseled patient regarding potential benefits, risks, and side effects of lithium to include potential risk of lithium affecting thyroid and renal function.  Discussed need for periodic lab monitoring to determine drug level and to assess for potential adverse effects.  Counseled patient regarding signs and symptoms of lithium toxicity and advised that they notify office immediately or seek urgent medical attention if experiencing these signs and symptoms.  Patient advised to contact office with any questions or concerns. Disc poss lithium tremor and it's impact.  Consider Rexulti, Vraylar,  clonidine  Option propranolol for tremor will retry if needed.  Supportive therapy dealing with wife's accusation.     6 mos  Meredith Staggers, MD, DFAPA  07/31/2021 addendum: Pt called in with his last lithium level result. Result obtained 05/10/21 and was 0.9.  Please see After Visit Summary for patient specific instructions.  Future Appointments  Date Time Provider Department Center  03/29/2022 10:00 AM [facility] PAT 2 [facility] None  04/02/2022 12:20 PM Hochrein, Fayrene Fearing, MD [facility]     No orders of the defined types were placed in this encounter.      -------------------------------

## 2022-03-28 NOTE — Pre-Procedure Instructions (Signed)
Surgical Instructions    Your procedure is scheduled on Thursday 04/05/22.   Report to [facility] Main Entrance "A" at 05:30 A.M., then check in with the Admitting office.  Call this number if you have problems the morning of surgery:  [phone]   If you have any questions prior to your surgery date call [phone]: Open Monday-Friday 8am-4pm    Remember:  Do not eat or drink after midnight the night before your surgery    Take these medicines the morning of surgery with A SIP OF WATER:   lithium carbonate (ESKALITH)    acetaminophen (TYLENOL)- If needed  ALPRAZolam Nathaniel Bush)- If needed   As of today, STOP taking any Aspirin (unless otherwise instructed by your surgeon) Aleve, Naproxen, Ibuprofen, Motrin, Advil, Goody's, BC's, all herbal medications, fish oil, and all vitamins.           Do not wear jewelry or makeup Do not wear lotions, powders, perfumes/colognes, or deodorant. Do not shave 48 hours prior to surgery.  Men may shave face and neck. Do not bring valuables to the hospital. Do not wear nail polish, gel polish, artificial nails, or any other type of covering on natural nails (fingers and toes) If you have artificial nails or gel coating that need to be removed by a nail salon, please have this removed prior to surgery. Artificial nails or gel coating may interfere with anesthesia's ability to adequately monitor your vital signs.  [facility] is not responsible for any belongings or valuables. .   Do NOT Smoke (Tobacco/Vaping)  24 hours prior to your procedure  If you use a CPAP at night, you may bring your mask for your overnight stay.   Contacts, glasses, hearing aids, dentures or partials may not be worn into surgery, please bring cases for these belongings   For patients admitted to the hospital, discharge time will be determined by your treatment team.   Patients discharged the day of surgery will not be allowed to drive home, and someone needs to stay with  them for 24 hours.   SURGICAL WAITING ROOM VISITATION Patients having surgery or a procedure in a hospital may have two support people. Children under the age of 62 must have an adult with them who is not the patient. They may stay in the waiting area during the procedure and may switch out with other visitors. If the patient needs to stay at the hospital during part of their recovery, the visitor guidelines for inpatient rooms apply.  Please refer to the [facility] website for the visitor guidelines for Inpatients (after your surgery is over and you are in a regular room).       Special instructions:    Oral Hygiene is also important to reduce your risk of infection.  Remember - BRUSH YOUR TEETH THE MORNING OF SURGERY WITH YOUR REGULAR TOOTHPASTE   [facility]- Preparing For Surgery  Before surgery, you can play an important role. Because skin is not sterile, your skin needs to be as free of germs as possible. You can reduce the number of germs on your skin by washing with CHG (chlorahexidine gluconate) Soap before surgery.  CHG is an antiseptic cleaner which kills germs and bonds with the skin to continue killing germs even after washing.     Please do not use if you have an allergy to CHG or antibacterial soaps. If your skin becomes reddened/irritated stop using the CHG.  Do not shave (including legs and underarms) for at least  48 hours prior to first CHG shower. It is OK to shave your face.  Please follow these instructions carefully.     Shower the NIGHT BEFORE SURGERY and the MORNING OF SURGERY with CHG Soap.   If you chose to wash your hair, wash your hair first as usual with your normal shampoo. After you shampoo, rinse your hair and body thoroughly to remove the shampoo.  Then [employer] and genitals (private parts) with your normal soap and rinse thoroughly to remove soap.  After that Use CHG Soap as you would any other liquid soap. You can apply CHG directly to the skin and  wash gently with a scrungie or a clean washcloth.   Apply the CHG Soap to your body ONLY FROM THE NECK DOWN.  Do not use on open wounds or open sores. Avoid contact with your eyes, ears, mouth and genitals (private parts). Wash Face and genitals (private parts)  with your normal soap.   Wash thoroughly, paying special attention to the area where your surgery will be performed.  Thoroughly rinse your body with warm water from the neck down.  DO NOT shower/wash with your normal soap after using and rinsing off the CHG Soap.  Pat yourself dry with a CLEAN TOWEL.  Wear CLEAN PAJAMAS to bed the night before surgery  Place CLEAN SHEETS on your bed the night before your surgery  DO NOT SLEEP WITH PETS.   Day of Surgery:  Take a shower with CHG soap. Wear Clean/Comfortable clothing the morning of surgery Do not apply any deodorants/lotions.   Remember to brush your teeth WITH YOUR REGULAR TOOTHPASTE.    If you received a COVID test during your pre-op visit, it is requested that you wear a mask when out in public, stay away from anyone that may not be feeling well, and notify your surgeon if you develop symptoms. If you have been in contact with anyone that has tested positive in the last 10 days, please notify your surgeon.    Please read over the following fact sheets that you were given.

## 2022-03-29 ENCOUNTER — Encounter (HOSPITAL_COMMUNITY): Payer: Self-pay

## 2022-03-29 ENCOUNTER — Encounter (HOSPITAL_COMMUNITY)
Admission: RE | Admit: 2022-03-29 | Discharge: 2022-03-29 | Disposition: A | Discharge status: Home / Self Care | Payer: Medicare Other | Source: Ambulatory Visit | Attending: Neurosurgery | Admitting: Neurosurgery

## 2022-03-29 ENCOUNTER — Other Ambulatory Visit: Payer: Self-pay

## 2022-03-29 VITALS — BP 132/61 | HR 76 | Temp 98.5°F | Resp 18 | Ht 69.0 in | Wt 169.5 lb

## 2022-03-29 DIAGNOSIS — Z01818 Encounter for other preprocedural examination: Secondary | ICD-10-CM | POA: Insufficient documentation

## 2022-03-29 DIAGNOSIS — R911 Solitary pulmonary nodule: Secondary | ICD-10-CM | POA: Diagnosis not present

## 2022-03-29 DIAGNOSIS — I371 Nonrheumatic pulmonary valve insufficiency: Secondary | ICD-10-CM | POA: Insufficient documentation

## 2022-03-29 DIAGNOSIS — I471 Supraventricular tachycardia: Secondary | ICD-10-CM | POA: Diagnosis not present

## 2022-03-29 DIAGNOSIS — F32A Depression, unspecified: Secondary | ICD-10-CM | POA: Diagnosis not present

## 2022-03-29 DIAGNOSIS — I480 Paroxysmal atrial fibrillation: Secondary | ICD-10-CM | POA: Insufficient documentation

## 2022-03-29 DIAGNOSIS — M48062 Spinal stenosis, lumbar region with neurogenic claudication: Secondary | ICD-10-CM | POA: Insufficient documentation

## 2022-03-29 HISTORY — DX: Unspecified osteoarthritis, unspecified site: M19.90

## 2022-03-29 LAB — BASIC METABOLIC PANEL
Anion gap: 10 (ref 5–15)
BUN: 15 mg/dL (ref 8–23)
CO2: 24 mmol/L (ref 22–32)
Calcium: 9.5 mg/dL (ref 8.9–10.3)
Chloride: 106 mmol/L (ref 98–111)
Creatinine, Ser: 0.99 mg/dL (ref 0.61–1.24)
GFR, Estimated: >60 mL/min (ref >60)
Glucose, Bld: 101 mg/dL — ABNORMAL HIGH (ref 70–99)
Potassium: 3.8 mmol/L (ref 3.5–5.1)
Sodium: 140 mmol/L (ref 135–145)

## 2022-03-29 LAB — CBC
HCT: 40.5 % (ref 39.0–52.0)
Hemoglobin: 14 g/dL (ref 13.0–17.0)
MCH: 31.3 pg (ref 26.0–34.0)
MCHC: 34.6 g/dL (ref 30.0–36.0)
MCV: 90.6 fL (ref 80.0–100.0)
Platelets: 169 10*3/uL (ref 150–400)
RBC: 4.47 MIL/uL (ref 4.22–5.81)
RDW: 12 % (ref 11.5–15.5)
WBC: 6.2 10*3/uL (ref 4.0–10.5)
nRBC: 0 % (ref 0.0–0.2)

## 2022-03-29 LAB — SURGICAL PCR SCREEN
MRSA, PCR: NEGATIVE
Staphylococcus aureus: NEGATIVE

## 2022-03-29 LAB — TYPE AND SCREEN
ABO/RH(D): A POS
Antibody Screen: NEGATIVE

## 2022-03-29 NOTE — Progress Notes (Signed)
PCP - Dr. Rodrigo Ran Cardiologist - Dr. Patrica Duel  PPM/ICD - n/a  Chest x-ray - n/a EKG - 03/29/22 Stress Test - 02/12/18 ECHO - 04/29/18 Cardiac Cath - denies  Sleep Study - denies CPAP - n/a  Diabetes Mellitus- denies  Blood Thinner Instructions: n/a Aspirin Instructions: n/a  ERAS Protcol - No. NPO   Anesthesia review: Yes. Cardiac History. EKG Review.  Patient denies shortness of breath, fever, cough and chest pain at PAT appointment   All instructions explained to the patient, with a verbal understanding of the material. Patient agrees to go over the instructions while at home for a better understanding. The opportunity to ask questions was provided.

## 2022-03-30 NOTE — Progress Notes (Signed)
Anesthesia Chart Review:  Case: [id number] Date/Time: 04/05/22 0715   Procedure: PLIF,IP,POSTERIOR INSTRUMENTATION L3-4;EXPLORE FUSION - 79M   Anesthesia type: General   Pre-op diagnosis: LUMBAR STENOSIS WITH NEUROGENIC CLAUDICATION   [facility]: [facility]OM [facility] / [facility]   Surgeons: Tressie Stalker, MD       DISCUSSION: Patient has a 71 year old male scheduled for the above procedure.  History includes never smoker, PAF/aflutter (s/p SVT ablation 08/14/19), arthritis, inguinal hernia repair (2000), spinal surgery (L4-5 TLIF 09/30/19), LLL lung nodule (followed by serial CT scans, stable 10/04/21), depression (on lithium). Reported + COVID-19 test on 03/12/22.   He was initially established with cardiologist Dr. Antoine Poche in 2019 for palpitations and bradycardia. 2019 Holter monitor showed PACs, brief rare PAT; however 07/07/19 EKG showed aflutter at 65 bpm. He was referred to EP and ultimately underwent ablation on 08/14/19. No further aflutter at 09/17/19 visit with EP Dr. Elberta Fortis, and as needed EP follow-up recommended. Last visit with Dr. Antoine Poche was on 03/07/20. Known elevated coronary calcium score of 89.4 (46th percentile for age, sex, and race based cohort) in May 2021. MESA score was only 6.2, and with negative ETT in 2019, no further changes in therapy or testing recommended at that time. EKG then showed SB at 45 bpm, LAD, poor r wave progression, PR 212 ms. No new SOB. No chest pain, palpitations, presyncope, syncope. He was back riding his bike and exercising vigorously. Two year follow-up planned.   He denied chest pain, cough, fever, or SOB at PAT RN visit. EKG is overall stable, occasional PAC. Anesthesia team to evaluate on the day of surgery.    VS: BP 132/61   Pulse 76   Temp 36.9 C   Resp 18   Ht 5\' 9"  (1.753 m)   Wt 76.9 kg   SpO2 100%   BMI 25.03 kg/m    PROVIDERS: , MD is PCP  Rodrigo Ran, MD is cardiologist. Last visit 03/07/20 with 2 year follow-up  planned. 05/07/20, MD is EP cardiologist.  Last visit 09/17/19. No further atrial flutter following ablation.  He was off anticoagulation.  As needed EP follow-up recommended.    LABS: Labs reviewed: Acceptable for surgery. (all labs ordered are listed, but only abnormal results are displayed)  Labs Reviewed  BASIC METABOLIC PANEL - Abnormal; Notable for the following components:      Result Value   Glucose, Bld 101 (*)    All other components within normal limits  SURGICAL PCR SCREEN  CBC  TYPE AND SCREEN    IMAGES: CT L-spine 02/28/22: IMPRESSION: 1. Progressive adjacent segment disease at L3-L4 with now severe spinal canal stenosis. 2. Prior L4-L5 TLIF with mild subsidence of the interbody graft and minimal bridging bone across the disc space. 3. Unchanged mild-to-moderate spinal canal stenosis at L2-L3.  CT Chest 10/04/21: IMPRESSION: 1. Unchanged 8 mm left lower lobe pulmonary nodule. Follow-up chest CT recommended in 12-18 months.    EKG: 03/29/22:  Sinus rhythm with 1st degree A-V block with Premature supraventricular complexes Left axis deviation Possible Anterior infarct , age undetermined Abnormal ECG Confirmed by 03/31/22 (Debbe Odea) on 03/30/2022 8:20:39 AM - PAC new when compared to 03/07/20 tracing.   CV: CT Coronary Calcium Score 02/16/20 (ordered by Dr. 02/18/20): IMPRESSION: 1. Total Agatston score of 89.4, corresponding to 46th percentile for age, sex, and race based cohort. 2. Left lower lobe pulmonary nodule, on the order of 7 mm. Non-contrast chest CT at 6-12 months is recommended. If  the nodule is stable at time of repeat CT, then future CT at 18-24 months (from today's scan) is considered optional for low-risk patients, but is recommended for high-risk patients. This recommendation follows the consensus statement: Guidelines for Management of Incidental Pulmonary Nodules Detected on CT Images: From the Fleischner Society 2017; Radiology 2017;  284:228-243. - 10/04/21 follow-up chest CT: unchanged 8 mm left lower lobe pulmonary nodule. Follow-up chest CT recommended in 12-18 months.   EP Study/RFA 08/14/19: 1. Comprehensive EP study.  2. Coronary sinus pacing and recording.  3. Mapping of supraventricular tachycardia.  4. Radiofrequency ablation of supraventricular tachycardia.      Echo 04/29/18: Study Conclusions:  - Left ventricle: The cavity size was normal. Systolic function was   normal. The estimated ejection fraction was in the range of 55%   to 60%. Wall motion was normal; there were no regional wall   motion abnormalities. Left ventricular diastolic function   parameters were normal. - Aortic valve: There was trivial regurgitation. - Mitral valve: There was mild regurgitation. - Right ventricle: The cavity size was mildly dilated. Wall   thickness was normal. - Tricuspid valve: There was mild regurgitation. - Pulmonic valve: There was mild regurgitation. Impressions: - Normal LVF with mild MR, mild TR and mild PR. Trivial AR. Mildly   dilated RV. PASP .   24-48 Hour Holter Monitor 03/31/18: NSR Rare ventricular ectopy. Atrial ectopy with PACs and bigeminy Rare brief runs of PAT.  No sustained arrhythmias   ETT 02/12/18: Blood pressure demonstrated a normal response to exercise. There was no ST segment deviation noted during stress. No T wave inversion was noted during stress. Excellent exercise capacity. Negative, adequate stress test.   Past Medical History:  Diagnosis Date   Arthritis    Depression    Dysrhythmia    aflutter   PAF (paroxysmal atrial fibrillation) (HCC)    Palpitations     Past Surgical History:  Procedure Laterality Date   A-FLUTTER ABLATION N/A 08/14/2019   Procedure: A-FLUTTER ABLATION;  Surgeon: Regan Lemming, MD;  [facility]: [facility];  Service: Cardiovascular;  Laterality: N/A;   INGUINAL HERNIA REPAIR     TOE SURGERY      MEDICATIONS:   acetaminophen (TYLENOL) 500 MG tablet   ALPRAZolam (XANAX) 0.25 MG tablet   COVID-19 mRNA Vac-TriS, Pfizer, (PFIZER-BIONT COVID-19 VAC-TRIS) SUSP injection   ibuprofen (ADVIL) 200 MG tablet   lithium carbonate (ESKALITH) 450 MG CR tablet   lithium carbonate 300 MG capsule   No current facility-administered medications for this encounter.    Shonna Chock, PA-C Surgical Short Stay/Anesthesiology [facility] Phone [phone] [facility] Phone [phone] 03/30/2022 1:36 PM

## 2022-03-30 NOTE — Anesthesia Preprocedure Evaluation (Signed)
Anesthesia Evaluation Anesthesia Physical Anesthesia Plan  ASA:   Anesthesia Plan:    Post-op Pain Management:    Induction:   PONV Risk Score and Plan:   Airway Management Planned:   Additional Equipment:   Intra-op Plan:   Post-operative Plan:   Informed Consent:   Plan Discussed with:   Anesthesia Plan Comments: (PAT note written 03/30/2022 by Shonna Chock, PA-C. )        Anesthesia Quick Evaluation

## 2022-04-01 NOTE — Progress Notes (Signed)
Cardiology Office Note   Date:  04/02/2022   ID:  Nathaniel Bush, DOB 1951-06-04, MRN [id number]  PCP:  Rodrigo Ran, MD  Cardiologist:   Rollene Rotunda, MD   Chief Complaint  Patient presents with   Atrial Fibrillation      History of Present Illness: Nathaniel Bush is a 71 y.o. male who follow up of atrial fib.   He is status post atrial flutter ablation.  He had a coronary calcium score which was 89.4 which put him at the 48th percentile.  Since I last saw him he has had no new cardiovascular complaints.  He continues to exercise.  He is riding his bike.  He does need to have another back fusion.  The patient denies any new symptoms such as chest discomfort, neck or arm discomfort. There has been no new shortness of breath, PND or orthopnea. There have been no reported palpitations, presyncope or syncope.    Past Medical History:  Diagnosis Date   Arthritis    Depression    Dysrhythmia    aflutter   PAF (paroxysmal atrial fibrillation) (HCC)    Palpitations     Past Surgical History:  Procedure Laterality Date   A-FLUTTER ABLATION N/A 08/14/2019   Procedure: A-FLUTTER ABLATION;  Surgeon: Regan Lemming, MD;  [facility];  Service: Cardiovascular;  Laterality: N/A;   INGUINAL HERNIA REPAIR     TOE SURGERY       Current Outpatient Medications  Medication Sig Dispense Refill   acetaminophen (TYLENOL) 500 MG tablet Take 1,000 mg by mouth every 6 (six) hours as needed (pain.).     ALPRAZolam (XANAX) 0.25 MG tablet Take 1 tablet (0.25 mg total) by mouth 2 (two) times daily as needed for anxiety. 30 tablet 4   COVID-19 mRNA Vac-TriS, Pfizer, (PFIZER-BIONT COVID-19 VAC-TRIS) SUSP injection Inject into the muscle. 0.3 mL 0   ibuprofen (ADVIL) 200 MG tablet Take 400 mg by mouth every 8 (eight) hours as needed (pain.).     lithium carbonate (ESKALITH) 450 MG CR tablet Take 1 tablet (450 mg total) by mouth daily. 90 tablet 1   lithium carbonate 300 MG  capsule Take 2 capsules (600 mg total) by mouth daily. 180 capsule 1   No current facility-administered medications for this visit.    Allergies:   Patient has no known allergies.    ROS:  Please see the history of present illness.   Otherwise, review of systems are positive for none.   All other systems are reviewed and negative.    PHYSICAL EXAM: VS:  BP 108/68   Pulse 60   Ht 5\' 10"  (1.778 m)   Wt 169 lb (76.7 kg)   SpO2 99%   BMI 24.25 kg/m  , BMI Body mass index is 24.25 kg/m. GENERAL:  Well appearing NECK:  No jugular venous distention, waveform within normal limits, carotid upstroke brisk and symmetric, no bruits, no thyromegaly LUNGS:  Clear to auscultation bilaterally CHEST:  Unremarkable HEART:  PMI not displaced or sustained,S1 and S2 within normal limits, no S3, no S4, no clicks, no rubs, no murmurs ABD:  Flat, positive bowel sounds normal in frequency in pitch, no bruits, no rebound, no guarding, no midline pulsatile mass, no hepatomegaly, no splenomegaly EXT:  2 plus pulses throughout, no edema, no cyanosis no clubbing    EKG:  EKG is  ordered today. The ekg ordered today demonstrates sinus bradycardia, rate , leftward axis, poor anterior R wave  progression.  Premature atrial contractions   Recent Labs: 03/29/2022: BUN 15; Creatinine, Ser 0.99; Hemoglobin 14.0; Platelets 169; Potassium 3.8; Sodium 140    Lipid Panel    Component Value Date/Time   CHOL 153 03/04/2020 0856   TRIG 69 03/04/2020 0856   HDL 63 03/04/2020 0856   CHOLHDL 2.4 03/04/2020 0856   LDLCALC 76 03/04/2020 0856      Wt Readings from Last 3 Encounters:  04/02/22 169 lb (76.7 kg)  03/29/22 169 lb 8 oz (76.9 kg)  03/07/20 162 lb (73.5 kg)      Other studies Reviewed: Additional studies/ records that were reviewed today include: Labs Review of the above records demonstrates:  Please see elsewhere in the note.     ASSESSMENT AND PLAN:  ATRIAL FLUTTER:     Has had no symptomatic  recurrence.  No change in therapy.  ELEVATED CORONARY CALCIUM:    No change in therapy.  We have talked about risk reduction to include potentially a statin but he has no absolute indication for this and would prefer diet only. No further testing is indicated at this point   DYSLIPIDEMIA:    As above.  He prefers dietary management.   BRADYCARDIA:   He has no symptoms related to this.  No change in therapy.  Current medicines are reviewed at length with the patient today.  The patient does not have concerns regarding medicines.  The following changes have been made:  None  Labs/ tests ordered today include:  None  Orders Placed This Encounter  Procedures   EKG 12-Lead     Disposition:   FU with me in one years   Signed, Rollene Rotunda, MD  04/02/2022 12:53 PM    [facility]

## 2022-04-02 ENCOUNTER — Encounter: Payer: Self-pay | Admitting: Cardiology

## 2022-04-02 ENCOUNTER — Ambulatory Visit: Payer: Medicare Other | Admitting: Cardiology

## 2022-04-02 VITALS — BP 108/68 | HR 60 | Ht 70.0 in | Wt 169.0 lb

## 2022-04-02 DIAGNOSIS — R931 Abnormal findings on diagnostic imaging of heart and coronary circulation: Secondary | ICD-10-CM

## 2022-04-02 DIAGNOSIS — E785 Hyperlipidemia, unspecified: Secondary | ICD-10-CM

## 2022-04-02 DIAGNOSIS — R001 Bradycardia, unspecified: Secondary | ICD-10-CM | POA: Diagnosis not present

## 2022-04-02 NOTE — Patient Instructions (Signed)
Medication Instructions:  No changes *If you need a refill on your cardiac medications before your next appointment, please call your pharmacy*   Lab Work: None ordered If you have labs (blood work) drawn today and your tests are completely normal, you will receive your results only by: MyChart Message (if you have MyChart) OR A paper copy in the mail If you have any lab test that is abnormal or we need to change your treatment, we will call you to review the results.   Testing/Procedures: None ordered   Follow-Up: At [facility], you and your health needs are our priority.  As part of our continuing mission to provide you with exceptional heart care, we have created designated Provider Care Teams.  These Care Teams include your primary Cardiologist (physician) and Advanced Practice Providers (APPs -  Physician Assistants and Nurse Practitioners) who all work together to provide you with the care you need, when you need it.  We recommend signing up for the patient portal called "MyChart".  Sign up information is provided on this After Visit Summary.  MyChart is used to connect with patients for Virtual Visits (Telemedicine).  Patients are able to view lab/test results, encounter notes, upcoming appointments, etc.  Non-urgent messages can be sent to your provider as well.   To learn more about what you can do with MyChart, go to [email or web address].    Your next appointment:   12 month(s)  The format for your next appointment:   In Person  Provider:   James Hochrein, MD {   Important Information About Sugar       

## 2022-04-05 ENCOUNTER — Inpatient Hospital Stay (HOSPITAL_COMMUNITY): Payer: Medicare Other | Admitting: Vascular Surgery

## 2022-04-05 ENCOUNTER — Inpatient Hospital Stay (HOSPITAL_COMMUNITY): Payer: Medicare Other

## 2022-04-05 ENCOUNTER — Other Ambulatory Visit: Payer: Self-pay

## 2022-04-05 ENCOUNTER — Inpatient Hospital Stay (HOSPITAL_COMMUNITY)
Admission: RE | Admit: 2022-04-05 | Discharge: 2022-04-05 | DRG: 455 | Disposition: A | Discharge status: Home / Self Care | Payer: Medicare Other | Attending: Neurosurgery | Admitting: Neurosurgery

## 2022-04-05 ENCOUNTER — Encounter (HOSPITAL_COMMUNITY): Payer: Self-pay | Admitting: Neurosurgery

## 2022-04-05 ENCOUNTER — Inpatient Hospital Stay (HOSPITAL_COMMUNITY): Payer: Medicare Other | Admitting: Anesthesiology

## 2022-04-05 ENCOUNTER — Encounter (HOSPITAL_COMMUNITY)
Admission: RE | Disposition: A | Discharge status: Home / Self Care | Payer: Self-pay | Source: Home / Self Care | Attending: Neurosurgery

## 2022-04-05 DIAGNOSIS — M4726 Other spondylosis with radiculopathy, lumbar region: Secondary | ICD-10-CM | POA: Diagnosis present

## 2022-04-05 DIAGNOSIS — M48062 Spinal stenosis, lumbar region with neurogenic claudication: Principal | ICD-10-CM | POA: Diagnosis present

## 2022-04-05 DIAGNOSIS — M5116 Intervertebral disc disorders with radiculopathy, lumbar region: Secondary | ICD-10-CM | POA: Diagnosis present

## 2022-04-05 SURGERY — POSTERIOR LUMBAR FUSION 1 LEVEL
Anesthesia: General | Site: Back

## 2022-04-05 MED ORDER — THROMBIN 20000 UNITS EX SOLR
CUTANEOUS | Status: AC
  Filled 2022-04-05: qty 20000

## 2022-04-05 MED ORDER — CEFAZOLIN SODIUM-DEXTROSE 2-4 GM/100ML-% IV SOLN
2.0000 g | Freq: Three times a day (TID) | INTRAVENOUS | Status: DC
  Administered 2022-04-05: 2 g via INTRAVENOUS
  Filled 2022-04-05: qty 100

## 2022-04-05 MED ORDER — CEFAZOLIN SODIUM-DEXTROSE 2-4 GM/100ML-% IV SOLN
2.0000 g | INTRAVENOUS | Status: AC
  Administered 2022-04-05: 2 g via INTRAVENOUS

## 2022-04-05 MED ORDER — MORPHINE SULFATE (PF) 4 MG/ML IV SOLN: 4.0000 mg | INTRAVENOUS | Status: DC | PRN

## 2022-04-05 MED ORDER — PHENYLEPHRINE 80 MCG/ML (10ML) SYRINGE FOR IV PUSH (FOR BLOOD PRESSURE SUPPORT)
PREFILLED_SYRINGE | INTRAVENOUS | Status: AC
Start: 2022-04-05 — End: ?
  Filled 2022-04-05: qty 10

## 2022-04-05 MED ORDER — ALPRAZOLAM 0.5 MG PO TABS
0.2500 mg | ORAL_TABLET | Freq: Two times a day (BID) | ORAL | Status: DC | PRN
Start: 2022-04-05 — End: 2022-04-05

## 2022-04-05 MED ORDER — ROCURONIUM BROMIDE 10 MG/ML (PF) SYRINGE
PREFILLED_SYRINGE | INTRAVENOUS | Status: AC
  Filled 2022-04-05: qty 10

## 2022-04-05 MED ORDER — ORAL CARE MOUTH RINSE: 15.0000 mL | Freq: Once | OROMUCOSAL | Status: AC

## 2022-04-05 MED ORDER — DEXAMETHASONE SODIUM PHOSPHATE 10 MG/ML IJ SOLN
INTRAMUSCULAR | Status: DC | PRN
  Administered 2022-04-05: 5 mg via INTRAVENOUS

## 2022-04-05 MED ORDER — BACITRACIN ZINC 500 UNIT/GM EX OINT
TOPICAL_OINTMENT | CUTANEOUS | Status: DC | PRN
  Administered 2022-04-05: 1 via TOPICAL

## 2022-04-05 MED ORDER — CHLORHEXIDINE GLUCONATE 0.12 % MT SOLN
15.0000 mL | Freq: Once | OROMUCOSAL | Status: AC
  Administered 2022-04-05: 15 mL via OROMUCOSAL

## 2022-04-05 MED ORDER — BUPIVACAINE LIPOSOME 1.3 % IJ SUSP
INTRAMUSCULAR | Status: AC
  Filled 2022-04-05: qty 20

## 2022-04-05 MED ORDER — ONDANSETRON HCL 4 MG/2ML IJ SOLN: 4.0000 mg | Freq: Four times a day (QID) | INTRAMUSCULAR | Status: DC | PRN

## 2022-04-05 MED ORDER — DEXAMETHASONE SODIUM PHOSPHATE 10 MG/ML IJ SOLN
INTRAMUSCULAR | Status: AC
  Filled 2022-04-05: qty 1

## 2022-04-05 MED ORDER — 0.9 % SODIUM CHLORIDE (POUR BTL) OPTIME
TOPICAL | Status: DC | PRN
  Administered 2022-04-05: 1000 mL

## 2022-04-05 MED ORDER — ACETAMINOPHEN 500 MG PO TABS
1000.0000 mg | ORAL_TABLET | Freq: Once | ORAL | Status: AC
  Administered 2022-04-05: 1000 mg via ORAL

## 2022-04-05 MED ORDER — ROCURONIUM BROMIDE 10 MG/ML (PF) SYRINGE
PREFILLED_SYRINGE | INTRAVENOUS | Status: DC | PRN
  Administered 2022-04-05: 30 mg via INTRAVENOUS
  Administered 2022-04-05: 70 mg via INTRAVENOUS
  Administered 2022-04-05: 20 mg via INTRAVENOUS

## 2022-04-05 MED ORDER — BUPIVACAINE LIPOSOME 1.3 % IJ SUSP
INTRAMUSCULAR | Status: DC | PRN
  Administered 2022-04-05: 20 mL

## 2022-04-05 MED ORDER — SODIUM CHLORIDE 0.9 % IV SOLN: 250.0000 mL | INTRAVENOUS | Status: DC

## 2022-04-05 MED ORDER — CHLORHEXIDINE GLUCONATE 0.12 % MT SOLN
OROMUCOSAL | Status: AC
  Filled 2022-04-05: qty 15

## 2022-04-05 MED ORDER — CHLORHEXIDINE GLUCONATE CLOTH 2 % EX PADS: 6.0000 | MEDICATED_PAD | Freq: Once | CUTANEOUS | Status: DC

## 2022-04-05 MED ORDER — LITHIUM CARBONATE ER 450 MG PO TBCR
450.0000 mg | EXTENDED_RELEASE_TABLET | Freq: Every day | ORAL | Status: DC
Start: 2022-04-05 — End: 2022-04-05
  Filled 2022-04-05: qty 1

## 2022-04-05 MED ORDER — EPHEDRINE 5 MG/ML INJ
INTRAVENOUS | Status: AC
  Filled 2022-04-05: qty 5

## 2022-04-05 MED ORDER — PHENOL 1.4 % MT LIQD: 1.0000 | OROMUCOSAL | Status: DC | PRN

## 2022-04-05 MED ORDER — CEFAZOLIN SODIUM-DEXTROSE 2-4 GM/100ML-% IV SOLN
INTRAVENOUS | Status: AC
  Filled 2022-04-05: qty 100

## 2022-04-05 MED ORDER — THROMBIN 5000 UNITS EX SOLR: OROMUCOSAL | Status: DC | PRN

## 2022-04-05 MED ORDER — FENTANYL CITRATE (PF) 250 MCG/5ML IJ SOLN
INTRAMUSCULAR | Status: DC | PRN
  Administered 2022-04-05 (×4): 50 ug via INTRAVENOUS

## 2022-04-05 MED ORDER — ACETAMINOPHEN 500 MG PO TABS
ORAL_TABLET | ORAL | Status: AC
  Filled 2022-04-05: qty 2

## 2022-04-05 MED ORDER — THROMBIN 5000 UNITS EX SOLR
CUTANEOUS | Status: AC
  Filled 2022-04-05: qty 5000

## 2022-04-05 MED ORDER — ONDANSETRON HCL 4 MG/2ML IJ SOLN
INTRAMUSCULAR | Status: AC
  Filled 2022-04-05: qty 2

## 2022-04-05 MED ORDER — ACETAMINOPHEN 650 MG RE SUPP: 650.0000 mg | RECTAL | Status: DC | PRN

## 2022-04-05 MED ORDER — ACETAMINOPHEN 500 MG PO TABS
1000.0000 mg | ORAL_TABLET | Freq: Four times a day (QID) | ORAL | Status: DC
  Administered 2022-04-05: 1000 mg via ORAL
  Filled 2022-04-05: qty 2

## 2022-04-05 MED ORDER — PHENYLEPHRINE 80 MCG/ML (10ML) SYRINGE FOR IV PUSH (FOR BLOOD PRESSURE SUPPORT)
PREFILLED_SYRINGE | INTRAVENOUS | Status: DC | PRN
  Administered 2022-04-05: 40 ug via INTRAVENOUS
  Administered 2022-04-05: 80 ug via INTRAVENOUS
  Administered 2022-04-05: 160 ug via INTRAVENOUS
  Administered 2022-04-05 (×2): 80 ug via INTRAVENOUS

## 2022-04-05 MED ORDER — KETOROLAC TROMETHAMINE 30 MG/ML IJ SOLN
INTRAMUSCULAR | Status: AC
  Filled 2022-04-05: qty 1

## 2022-04-05 MED ORDER — FENTANYL CITRATE (PF) 250 MCG/5ML IJ SOLN
INTRAMUSCULAR | Status: AC
  Filled 2022-04-05: qty 5

## 2022-04-05 MED ORDER — FENTANYL CITRATE (PF) 100 MCG/2ML IJ SOLN: 25.0000 ug | INTRAMUSCULAR | Status: DC | PRN

## 2022-04-05 MED ORDER — ACETAMINOPHEN 325 MG PO TABS: 650.0000 mg | ORAL_TABLET | ORAL | Status: DC | PRN

## 2022-04-05 MED ORDER — LIDOCAINE 2% (20 MG/ML) 5 ML SYRINGE
INTRAMUSCULAR | Status: AC
  Filled 2022-04-05: qty 5

## 2022-04-05 MED ORDER — CYCLOBENZAPRINE HCL 5 MG PO TABS: 5.0000 mg | ORAL_TABLET | Freq: Three times a day (TID) | ORAL | Status: DC | PRN

## 2022-04-05 MED ORDER — OXYCODONE-ACETAMINOPHEN 5-325 MG PO TABS: 1.0000 | ORAL_TABLET | ORAL | Status: DC | PRN

## 2022-04-05 MED ORDER — SUGAMMADEX SODIUM 200 MG/2ML IV SOLN
INTRAVENOUS | Status: DC | PRN
  Administered 2022-04-05: 200 mg via INTRAVENOUS

## 2022-04-05 MED ORDER — CYCLOBENZAPRINE HCL 5 MG PO TABS: 5.0000 mg | ORAL_TABLET | Freq: Three times a day (TID) | ORAL | 0 refills | Status: DC | PRN

## 2022-04-05 MED ORDER — LITHIUM CARBONATE 300 MG PO CAPS
300.0000 mg | ORAL_CAPSULE | Freq: Every day | ORAL | Status: DC
  Filled 2022-04-05: qty 1

## 2022-04-05 MED ORDER — AMISULPRIDE (ANTIEMETIC) 5 MG/2ML IV SOLN
INTRAVENOUS | Status: AC
  Administered 2022-04-05: 10 mg
  Filled 2022-04-05: qty 4

## 2022-04-05 MED ORDER — OXYCODONE HCL 5 MG PO TABS: 10.0000 mg | ORAL_TABLET | ORAL | Status: DC | PRN

## 2022-04-05 MED ORDER — OXYCODONE HCL 5 MG PO TABS: 5.0000 mg | ORAL_TABLET | ORAL | Status: DC | PRN

## 2022-04-05 MED ORDER — SODIUM CHLORIDE 0.9% FLUSH: 3.0000 mL | Freq: Two times a day (BID) | INTRAVENOUS | Status: DC

## 2022-04-05 MED ORDER — BUPIVACAINE-EPINEPHRINE (PF) 0.5% -1:200000 IJ SOLN
INTRAMUSCULAR | Status: DC | PRN
  Administered 2022-04-05: 10 mL via PERINEURAL

## 2022-04-05 MED ORDER — EPHEDRINE SULFATE-NACL 50-0.9 MG/10ML-% IV SOSY
PREFILLED_SYRINGE | INTRAVENOUS | Status: DC | PRN
  Administered 2022-04-05: 5 mg via INTRAVENOUS

## 2022-04-05 MED ORDER — AMISULPRIDE (ANTIEMETIC) 5 MG/2ML IV SOLN
10.0000 mg | Freq: Once | INTRAVENOUS | Status: AC
Start: 2022-04-05 — End: 2022-04-05

## 2022-04-05 MED ORDER — LIDOCAINE 2% (20 MG/ML) 5 ML SYRINGE
INTRAMUSCULAR | Status: DC | PRN
  Administered 2022-04-05: 60 mg via INTRAVENOUS

## 2022-04-05 MED ORDER — ONDANSETRON HCL 4 MG/2ML IJ SOLN
INTRAMUSCULAR | Status: DC | PRN
  Administered 2022-04-05: 4 mg via INTRAVENOUS

## 2022-04-05 MED ORDER — LACTATED RINGERS IV SOLN: INTRAVENOUS | Status: DC

## 2022-04-05 MED ORDER — CYCLOBENZAPRINE HCL 10 MG PO TABS: 10.0000 mg | ORAL_TABLET | Freq: Three times a day (TID) | ORAL | Status: DC | PRN

## 2022-04-05 MED ORDER — KETOROLAC TROMETHAMINE 30 MG/ML IJ SOLN
INTRAMUSCULAR | Status: DC | PRN
  Administered 2022-04-05: 30 mg via INTRAVENOUS

## 2022-04-05 MED ORDER — BISACODYL 10 MG RE SUPP: 10.0000 mg | Freq: Every day | RECTAL | Status: DC | PRN

## 2022-04-05 MED ORDER — MENTHOL 3 MG MT LOZG
1.0000 | LOZENGE | OROMUCOSAL | Status: DC | PRN
Start: 2022-04-05 — End: 2022-04-05

## 2022-04-05 MED ORDER — BUPIVACAINE-EPINEPHRINE 0.5% -1:200000 IJ SOLN
INTRAMUSCULAR | Status: AC
  Filled 2022-04-05: qty 1

## 2022-04-05 MED ORDER — SODIUM CHLORIDE 0.9% FLUSH: 3.0000 mL | INTRAVENOUS | Status: DC | PRN

## 2022-04-05 MED ORDER — ONDANSETRON HCL 4 MG PO TABS: 4.0000 mg | ORAL_TABLET | Freq: Four times a day (QID) | ORAL | Status: DC | PRN

## 2022-04-05 MED ORDER — DOCUSATE SODIUM 100 MG PO CAPS: 100.0000 mg | ORAL_CAPSULE | Freq: Two times a day (BID) | ORAL | 0 refills | Status: AC

## 2022-04-05 MED ORDER — PROPOFOL 10 MG/ML IV BOLUS
INTRAVENOUS | Status: AC
  Filled 2022-04-05: qty 20

## 2022-04-05 MED ORDER — PROPOFOL 10 MG/ML IV BOLUS
INTRAVENOUS | Status: DC | PRN
  Administered 2022-04-05: 150 mg via INTRAVENOUS

## 2022-04-05 MED ORDER — OXYCODONE-ACETAMINOPHEN 5-325 MG PO TABS: 1.0000 | ORAL_TABLET | ORAL | 0 refills | Status: DC | PRN

## 2022-04-05 MED ORDER — DOCUSATE SODIUM 100 MG PO CAPS: 100.0000 mg | ORAL_CAPSULE | Freq: Two times a day (BID) | ORAL | Status: DC

## 2022-04-05 SURGICAL SUPPLY — 65 items
BAG COUNTER SPONGE SURGICOUNT (BAG) ×2 IMPLANT
BASKET BONE COLLECTION (BASKET) ×2 IMPLANT
BENZOIN TINCTURE PRP APPL 2/3 (GAUZE/BANDAGES/DRESSINGS) ×2 IMPLANT
BLADE CLIPPER SURG (BLADE) IMPLANT
BUR MATCHSTICK NEURO 3.0 LAGG (BURR) ×2 IMPLANT
BUR PRECISION FLUTE 6.0 (BURR) ×2 IMPLANT
CAGE ALTERA 10X31X9-13 15D (Cage) ×1 IMPLANT
CANISTER SUCT 3000ML PPV (MISCELLANEOUS) ×2 IMPLANT
CAP LOCK DLX THRD (Cap) ×6 IMPLANT
CARTRIDGE OIL MAESTRO DRILL (MISCELLANEOUS) ×1 IMPLANT
CNTNR URN SCR LID CUP LEK RST (MISCELLANEOUS) ×1 IMPLANT
CONT SPEC 4OZ STRL OR WHT (MISCELLANEOUS) ×2
COVER BACK TABLE 60X90IN (DRAPES) ×2 IMPLANT
DIFFUSER DRILL AIR PNEUMATIC (MISCELLANEOUS) ×2 IMPLANT
DRAPE C-ARM 42X72 X-RAY (DRAPES) ×4 IMPLANT
DRAPE HALF SHEET 40X57 (DRAPES) ×2 IMPLANT
DRAPE LAPAROTOMY 100X72X124 (DRAPES) ×2 IMPLANT
DRAPE SURG 17X23 STRL (DRAPES) ×8 IMPLANT
DRSG OPSITE POSTOP 4X6 (GAUZE/BANDAGES/DRESSINGS) ×2 IMPLANT
ELECT BLADE 4.0 EZ CLEAN MEGAD (MISCELLANEOUS) ×2
ELECT REM PT RETURN 9FT ADLT (ELECTROSURGICAL) ×2
ELECTRODE BLDE 4.0 EZ CLN MEGD (MISCELLANEOUS) ×1 IMPLANT
ELECTRODE REM PT RTRN 9FT ADLT (ELECTROSURGICAL) ×1 IMPLANT
EVACUATOR 1/8 PVC DRAIN (DRAIN) IMPLANT
GAUZE 4X4 16PLY ~~LOC~~+RFID DBL (SPONGE) ×2 IMPLANT
GLOVE BIO SURGEON STRL SZ 6 (GLOVE) ×2 IMPLANT
GLOVE BIO SURGEON STRL SZ8 (GLOVE) ×4 IMPLANT
GLOVE BIO SURGEON STRL SZ8.5 (GLOVE) ×4 IMPLANT
GLOVE BIOGEL PI IND STRL 6.5 (GLOVE) ×1 IMPLANT
GLOVE BIOGEL PI INDICATOR 6.5 (GLOVE) ×1
GLOVE EXAM NITRILE XL STR (GLOVE) IMPLANT
GOWN STRL REUS W/ TWL LRG LVL3 (GOWN DISPOSABLE) ×1 IMPLANT
GOWN STRL REUS W/ TWL XL LVL3 (GOWN DISPOSABLE) ×2 IMPLANT
GOWN STRL REUS W/TWL 2XL LVL3 (GOWN DISPOSABLE) IMPLANT
GOWN STRL REUS W/TWL LRG LVL3 (GOWN DISPOSABLE) ×2
GOWN STRL REUS W/TWL XL LVL3 (GOWN DISPOSABLE) ×4
HEMOSTAT POWDER KIT SURGIFOAM (HEMOSTASIS) ×2 IMPLANT
KIT BASIN OR (CUSTOM PROCEDURE TRAY) ×2 IMPLANT
KIT GRAFTMAG DEL NEURO DISP (NEUROSURGERY SUPPLIES) ×1 IMPLANT
KIT TURNOVER KIT B (KITS) ×2 IMPLANT
NDL HYPO 21X1.5 SAFETY (NEEDLE) IMPLANT
NEEDLE HYPO 21X1.5 SAFETY (NEEDLE) IMPLANT
NEEDLE HYPO 22GX1.5 SAFETY (NEEDLE) ×2 IMPLANT
NS IRRIG 1000ML POUR BTL (IV SOLUTION) ×2 IMPLANT
OIL CARTRIDGE MAESTRO DRILL (MISCELLANEOUS) ×2
PACK LAMINECTOMY NEURO (CUSTOM PROCEDURE TRAY) ×2 IMPLANT
PAD ARMBOARD 7.5X6 YLW CONV (MISCELLANEOUS) ×6 IMPLANT
PATTIES SURGICAL .5 X1 (DISPOSABLE) IMPLANT
PUTTY DBM 10CC CALC GRAN (Putty) ×1 IMPLANT
ROD CURVED TI 6.35X80 (Rod) ×2 IMPLANT
SCREW PA DLX CREO 7.5X50 (Screw) ×2 IMPLANT
SPIKE FLUID TRANSFER (MISCELLANEOUS) ×2 IMPLANT
SPONGE NEURO XRAY DETECT 1X3 (DISPOSABLE) IMPLANT
SPONGE SURGIFOAM ABS GEL 100 (HEMOSTASIS) IMPLANT
SPONGE T-LAP 4X18 ~~LOC~~+RFID (SPONGE) IMPLANT
STRIP CLOSURE SKIN 1/2X4 (GAUZE/BANDAGES/DRESSINGS) ×2 IMPLANT
SUT VIC AB 1 CT1 18XBRD ANBCTR (SUTURE) ×2 IMPLANT
SUT VIC AB 1 CT1 8-18 (SUTURE) ×4
SUT VIC AB 2-0 CP2 18 (SUTURE) ×4 IMPLANT
SYR 20ML LL LF (SYRINGE) IMPLANT
TAP SURG GLBU 6.5X40 (ORTHOPEDIC DISPOSABLE SUPPLIES) ×1 IMPLANT
TOWEL GREEN STERILE (TOWEL DISPOSABLE) ×2 IMPLANT
TOWEL GREEN STERILE FF (TOWEL DISPOSABLE) ×2 IMPLANT
TRAY FOLEY MTR SLVR 16FR STAT (SET/KITS/TRAYS/PACK) ×2 IMPLANT
WATER STERILE IRR 1000ML POUR (IV SOLUTION) ×2 IMPLANT

## 2022-04-05 NOTE — Transfer of Care (Signed)
Immediate Anesthesia Transfer of Care Note  Patient: Nathaniel Bush  Procedure(s) Performed: PLIF,IP,POSTERIOR INSTRUMENTATION L3-4;EXPLORE FUSION (Back)  Patient Location: PACU  Anesthesia Type:General  Level of Consciousness: drowsy  Airway & Oxygen Therapy: Patient Spontanous Breathing  Post-op Assessment: Report given to RN and Post -op Vital signs reviewed and stable  Post vital signs: Reviewed and stable  Last Vitals:  Vitals Value Taken Time  BP 126/62 04/05/22 1110  Temp 36.8 C 04/05/22 1110  Pulse 81 04/05/22 1114  Resp 28 04/05/22 1114  SpO2 97 % 04/05/22 1114  Vitals shown include unvalidated device data.  Last Pain:  Vitals:   04/05/22 0621  TempSrc:   PainSc: 3       Patients Stated Pain Goal: 0 (04/05/22 5465)  Complications: No notable events documented.

## 2022-04-05 NOTE — Discharge Summary (Signed)
Physician Discharge Summary  Patient ID: Nathaniel Bush MRN: [id number] DOB/AGE: [birth year] 71 y.o.  Admit date: 04/05/2022 Discharge date: 04/05/2022  Admission Diagnoses: Lumbar spinal stenosis with neurogenic claudication, lumbago, lumbar radiculopathy,  Discharge Diagnoses: The same Principal Problem:   Spinal stenosis of lumbar region with neurogenic claudication   Discharged Condition: good  Hospital Course: I performed an L3-4 decompression, instrumentation and fusion on the patient on 04/05/2022.  The surgery went well.  The patient's postoperative course was unremarkable.  On the day of the surgery the patient looked and felt well.  He requested discharge to home.  He was given verbal and written discharge instructions.  All his questions were answered.  Consults: PT, OT, care management Significant Diagnostic Studies: None Treatments: L3-4 decompression, instrumentation and fusion. Discharge Exam: Blood pressure (!) 120/46, pulse (!) 54, temperature 98.5 F (36.9 C), temperature source Oral, resp. rate 18, height 5\' 10"  (1.778 m), weight 75.8 kg, SpO2 100 %. The patient is alert and pleasant.  He looks well.  His dressing has a small bloodstain.  His strength is normal.  Disposition: Home  Discharge Instructions      Remove dressing in 72 hours   Complete by: As directed    Call MD for:  difficulty breathing, headache or visual disturbances   Complete by: As directed    Call MD for:  extreme fatigue   Complete by: As directed    Call MD for:  hives   Complete by: As directed    Call MD for:  persistant dizziness or light-headedness   Complete by: As directed    Call MD for:  persistant nausea and vomiting   Complete by: As directed    Call MD for:  redness, tenderness, or signs of infection (pain, swelling, redness, odor or green/yellow discharge around incision site)   Complete by: As directed    Call MD for:  severe uncontrolled pain   Complete by: As directed     Call MD for:  temperature >100.4   Complete by: As directed    Diet - low sodium heart healthy   Complete by: As directed    Discharge instructions   Complete by: As directed    Call [phone] for a followup appointment. Take a stool softener while you are using pain medications.   Driving Restrictions   Complete by: As directed    Do not drive for 2 weeks.   Increase activity slowly   Complete by: As directed    Lifting restrictions   Complete by: As directed    Do not lift more than 5 pounds. No excessive bending or twisting.   May shower / Bathe   Complete by: As directed    Remove the dressing for 3 days after surgery.  You may shower, but leave the incision alone.      Allergies as of 04/05/2022   No Known Allergies      Medication List     STOP taking these medications    ibuprofen 200 MG tablet Commonly known as: ADVIL       TAKE these medications    acetaminophen 500 MG tablet Commonly known as: TYLENOL Take 1,000 mg by mouth every 6 (six) hours as needed (pain.).   ALPRAZolam 0.25 MG tablet Commonly known as: XANAX Take 1 tablet (0.25 mg total) by mouth 2 (two) times daily as needed for anxiety.   cyclobenzaprine 5 MG tablet Commonly known as: FLEXERIL Take 1 tablet (5 mg total) by mouth  3 (three) times daily as needed for muscle spasms.   docusate sodium 100 MG capsule Commonly known as: COLACE Take 1 capsule (100 mg total) by mouth 2 (two) times daily.   lithium carbonate 450 MG CR tablet Commonly known as: ESKALITH Take 1 tablet (450 mg total) by mouth daily. What changed: additional instructions   lithium carbonate 300 MG capsule Take 2 capsules (600 mg total) by mouth daily. What changed:  when to take this additional instructions   oxyCODONE-acetaminophen 5-325 MG tablet Commonly known as: PERCOCET/ROXICET Take 1-2 tablets by mouth every 4 (four) hours as needed for moderate pain.   Pfizer-BioNT COVID-19 Vac-TriS Susp  injection Generic drug: COVID-19 mRNA Vac-TriS (Pfizer) Inject into the muscle.         Signed: Cristi Loron 04/05/2022, 3:57 PM

## 2022-04-05 NOTE — Evaluation (Signed)
Physical Therapy Evaluation Patient Details Name: Coleton Woon MRN: [id number] DOB: 05-Feb-1951 Today's Date: 04/05/2022  History of Present Illness  71 y.o. male admitted and underwent Bilateral L3-4 laminotomy/foraminotomies/medial facetectomy, L3-4 transforaminal lumbar interbody fusion, posterior segmental instrumentation from L3 to L5 ,   posterior lateral arthrodesis at L3-4 , and exploration of lumbar fusion/removal of lumbar hardware 04/05/22. PMH:  Arthritis, Depression, Dysrhythmia aflutter, paroxysmal atrial fibrillation, Palpitations  Clinical Impression  Patient is s/p above surgery presenting with functional limitations due to the deficits listed below (see PT Problem List). Pt very active prior to admission, eager to work with PT and motivated to return to active lifestyle. Reviewed precautions, provided handout, educated on bed mobility, transfer, and ambulated up to 100 feet today. Feels confident he can safely navigate steps with rails at home, declines practice at this time. No evidence of LE buckling or overt LOB during evaluation. All questions answered. Will continue to follow-up until d/c.         Recommendations for follow up therapy are one component of a multi-disciplinary discharge planning process, led by the attending physician.  Recommendations may be updated based on patient status, additional functional criteria and insurance authorization.  Follow Up Recommendations Follow physician's recommendations for discharge plan and follow up therapies      Assistance Recommended at Discharge Set up Supervision/Assistance  Patient can return home with the following  A little help with bathing/dressing/bathroom;Assistance with cooking/housework;Assist for transportation;Help with stairs or ramp for entrance    Equipment Recommendations None recommended by PT  Recommendations for Other Services       Functional Status Assessment Patient has had a recent decline in their  functional status and demonstrates the ability to make significant improvements in function in a reasonable and predictable amount of time.     Precautions / Restrictions Precautions Precautions: Back Precaution Booklet Issued: Yes (comment) Precaution Comments: reviewed handout Required Braces or Orthoses: Spinal Brace Spinal Brace: Lumbar corset;Applied in sitting position (ok to walk to bathroom without, sit without, shower without) Restrictions Weight Bearing Restrictions: No      Mobility  Bed Mobility Overal bed mobility: Needs Assistance Bed Mobility: Rolling, Sidelying to Sit Rolling: Supervision Sidelying to sit: Supervision       General bed mobility comments: Educated on log roll technique, performed with supervision for safety, cues for technique.    Transfers Overall transfer level: Needs assistance Equipment used: None Transfers: Sit to/from Stand Sit to Stand: Supervision           General transfer comment: Supervision for safety, no overt instability noted. Good control and maintains back precautions.    Ambulation/Gait Ambulation/Gait assistance: Supervision Gait Distance (Feet): 100 Feet Assistive device: None Gait Pattern/deviations: Step-through pattern, Decreased stride length       General Gait Details: Supervision for safety, slightly reduced gait speed, cautious but without any evidence of LOB or buckling. Challenged with backwards steps and turning, all performed safely.  Stairs Stairs:  (Declines practice, feels confident)          Wheelchair Mobility    Modified Rankin (Stroke Patients Only)       Balance Overall balance assessment: No apparent balance deficits (not formally assessed)                                           Pertinent Vitals/Pain Pain Assessment Pain Assessment: No/denies pain  Home Living Family/patient expects to be discharged to:: Private residence Living Arrangements:  Spouse/significant other Available Help at Discharge: Family;Available 24 hours/day (wife RN, available until next monday.) Type of Home: House Home Access: Stairs to enter Entrance Stairs-Rails: Doctor, general practice of Steps: 5 Alternate Level Stairs-Number of Steps: 5 Home Layout: Two level;Bed/bath upstairs Home Equipment: Cane - single point      Prior Function Prior Level of Function : Independent/Modified Independent (Very active bike riding.)             Mobility Comments: ind ADLs Comments: ind     Hand Dominance   Dominant Hand: Right    Extremity/Trunk Assessment   Upper Extremity Assessment Upper Extremity Assessment: Defer to OT evaluation    Lower Extremity Assessment Lower Extremity Assessment: Overall WFL for tasks assessed (no overt strength or sensory deficits noted)       Communication   Communication: No difficulties  Cognition Arousal/Alertness: Awake/alert Behavior During Therapy: WFL for tasks assessed/performed Overall Cognitive Status: Within Functional Limits for tasks assessed                                          General Comments      Exercises     Assessment/Plan    PT Assessment Patient does not need any further PT services  PT Problem List         PT Treatment Interventions      PT Goals (Current goals can be found in the Care Plan section)  Acute Rehab PT Goals Patient Stated Goal: Get back to biking PT Goal Formulation: With patient Time For Goal Achievement: 04/09/22 Potential to Achieve Goals: Good    Frequency       Co-evaluation               AM-PAC PT "6 Clicks" Mobility  Outcome Measure Help needed turning from your back to your side while in a flat bed without using bedrails?: None Help needed moving from lying on your back to sitting on the side of a flat bed without using bedrails?: None Help needed moving to and from a bed to a chair (including a wheelchair)?:  None Help needed standing up from a chair using your arms (e.g., wheelchair or bedside chair)?: None Help needed to walk in hospital room?: None Help needed climbing 3-5 steps with a railing? : A Little 6 Click Score: 23    End of Session Equipment Utilized During Treatment: Gait belt Activity Tolerance: Patient tolerated treatment well Patient left: in chair;with call bell/phone within reach;with SCD's reapplied Nurse Communication: Mobility status PT Visit Diagnosis: Other abnormalities of gait and mobility (R26.89)    Time: 1440-1501 PT Time Calculation (min) (ACUTE ONLY): 21 min   Charges:   PT Evaluation $PT Eval Low Complexity: 1 Low          Kathlyn Sacramento, PT   Berton Mount 04/05/2022, 3:58 PM

## 2022-04-05 NOTE — Evaluation (Signed)
Occupational Therapy Evaluation Patient Details Name: Nathaniel Bush MRN: [id number] DOB: 11/24/1950 Today's Date: 04/05/2022   History of Present Illness 71 y.o. male admitted and underwent Bilateral L3-4 laminotomy/foraminotomies/medial facetectomy, L3-4 transforaminal lumbar interbody fusion, posterior segmental instrumentation from L3 to L5 ,   posterior lateral arthrodesis at L3-4 , and exploration of lumbar fusion/removal of lumbar hardware 04/05/22. PMH:  Arthritis, Depression, Dysrhythmia aflutter, paroxysmal atrial fibrillation, Palpitations   Clinical Impression   Pt admitted for concerns listed above. PTA pt reported that he was independent with all ADL's and IADL's, including road biking. At this time, pt presents with mild pain and weakness. OT provided compensatory strategies for ADL's and IADL's and pt demonstrated good understanding. At this time, he has no further OT needs and acute OT will sign off.       Recommendations for follow up therapy are one component of a multi-disciplinary discharge planning process, led by the attending physician.  Recommendations may be updated based on patient status, additional functional criteria and insurance authorization.   Follow Up Recommendations  No OT follow up    Assistance Recommended at Discharge PRN  Patient can return home with the following Assistance with cooking/housework    Functional Status Assessment  Patient has had a recent decline in their functional status and demonstrates the ability to make significant improvements in function in a reasonable and predictable amount of time.  Equipment Recommendations  None recommended by OT    Recommendations for Other Services       Precautions / Restrictions Precautions Precautions: Back Precaution Booklet Issued: Yes (comment) Precaution Comments: reviewed precautions and compensatory strategies Required Braces or Orthoses: Spinal Brace Spinal Brace: Lumbar corset;Applied  in sitting position (ok to walk to bathroom without, sit without, shower without) Restrictions Weight Bearing Restrictions: No      Mobility Bed Mobility               General bed mobility comments: Up in chair, Verbally reviewed log roll technique    Transfers Overall transfer level: Needs assistance Equipment used: None Transfers: Sit to/from Stand Sit to Stand: Supervision           General transfer comment: Sup for safety      Balance Overall balance assessment: No apparent balance deficits (not formally assessed)                                         ADL either performed or assessed with clinical judgement   ADL Overall ADL's : At baseline;Modified independent                                       General ADL Comments: Able to complete ADL's with no difficulties, following compensatory strategies     Vision Baseline Vision/History: 1 Wears glasses Ability to See in Adequate Light: 0 Adequate Patient Visual Report: No change from baseline Vision Assessment?: No apparent visual deficits     Perception     Praxis      Pertinent Vitals/Pain Pain Assessment Pain Assessment: No/denies pain     Hand Dominance Right   Extremity/Trunk Assessment Upper Extremity Assessment Upper Extremity Assessment: Overall WFL for tasks assessed   Lower Extremity Assessment Lower Extremity Assessment: Overall WFL for tasks assessed   Cervical / Trunk Assessment Cervical / Trunk  Assessment: Back Surgery   Communication Communication Communication: No difficulties   Cognition Arousal/Alertness: Awake/alert Behavior During Therapy: WFL for tasks assessed/performed Overall Cognitive Status: Within Functional Limits for tasks assessed                                       General Comments  VSS on RA    Exercises     Shoulder Instructions      Home Living Family/patient expects to be discharged to::  Private residence Living Arrangements: Spouse/significant other Available Help at Discharge: Family;Available 24 hours/day (wife RN, available until next monday.) Type of Home: House Home Access: Stairs to enter [employer] of Steps: 5 Entrance Stairs-Rails: Right;Left Home Layout: Two level;Bed/bath upstairs Alternate Level Stairs-Number of Steps: 5 Alternate Level Stairs-Rails: Right;Left;Can reach both Bathroom Shower/Tub: Producer, television/film/video: Handicapped height     Home Equipment: Cane - single point          Prior Functioning/Environment Prior Level of Function : Independent/Modified Independent;Driving (Very active bike riding.)             Mobility Comments: ind ADLs Comments: ind        OT Problem List: Decreased activity tolerance;Impaired balance (sitting and/or standing);Pain      OT Treatment/Interventions:      OT Goals(Current goals can be found in the care plan section) Acute Rehab OT Goals Patient Stated Goal: To go home OT Goal Formulation: With patient Time For Goal Achievement: 04/05/22 Potential to Achieve Goals: Good  OT Frequency:      Co-evaluation              AM-PAC OT "6 Clicks" Daily Activity     Outcome Measure Help from another person eating meals?: None Help from another person taking care of personal grooming?: None Help from another person toileting, which includes using toliet, bedpan, or urinal?: None Help from another person bathing (including washing, rinsing, drying)?: None Help from another person to put on and taking off regular upper body clothing?: None Help from another person to put on and taking off regular lower body clothing?: None 6 Click Score: 24   End of Session Nurse Communication: Mobility status  Activity Tolerance: Patient tolerated treatment well Patient left: in chair;with call bell/phone within reach  OT Visit Diagnosis: Unsteadiness on feet (R26.81);Other abnormalities  of gait and mobility (R26.89);Muscle weakness (generalized) (M62.81)                Time: 7371-0626 OT Time Calculation (min): 23 min Charges:  OT General Charges $OT Visit: 1 Visit OT Evaluation $OT Eval Moderate Complexity: 1 Mod OT Treatments $Self Care/Home Management : 8-22 mins  Karsin Pesta H., OTR/L Acute Rehabilitation  Rindy Kollman Elane Zeva Leber 04/05/2022, 4:54 PM

## 2022-04-05 NOTE — Op Note (Signed)
Brief history: The patient is a 71 year old white male on whom I performed a previous lumbar fusion.  He has done well until recently when he developed recurrent back and leg pain.  He failed medical management.  He was worked up with a lumbar myelo CT which demonstrated spinal stenosis at L3-4.  I discussed the various treatment options with him.  He has decided to proceed with surgery.  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 Etc. requiring a wide decompression of the nerve roots.);  L3-4 transforaminal lumbar interbody fusion with local morselized autograft bone and Zimmer DBM; insertion of interbody prosthesis at L3-4 (globus peek expandable interbody prosthesis); posterior segmental instrumentation from L3 to L5 with globus titanium pedicle screws and rods; posterior lateral arthrodesis at L3-4 with local morselized autograft bone and Zimmer DBM; exploration of lumbar fusion/removal of lumbar hardware.  Surgeon: Dr. Delma Officer  Asst.: Dr. Barnett Abu and Hildred Priest, NP  Anesthesia: Gen. endotracheal  Estimated blood loss: 200 cc  Drains: None  Complications: None  Description of procedure: The patient was brought to the operating room by the anesthesia team. General endotracheal anesthesia was induced. The patient was turned to the prone position on the Wilson frame. The patient's lumbosacral region was then prepared with Betadine scrub and Betadine solution. Sterile drapes were applied.  I then injected the  area to be incised with Marcaine with epinephrine solution. I then used the scalpel to make a linear midline incision over the L3-4 and L4-5 interspace. I then used electrocautery to perform a bilateral subperiosteal dissection exposing the spinous process and lamina of L3, L4 and L5, and exposing the old hardware at L4-5.  We then inserted the Verstrac retractor to provide exposure.  We explored the fusion by removing the caps from the old screws.  We then remove the rods.  We inspected the arthrodesis at L4-5.  It appeared solid.  I began the decompression by using the high speed drill to perform laminotomies at L3-4 bilaterally. We then used the Kerrison punches to widen the laminotomy and removed the ligamentum flavum at L3-4 bilaterally. We used the Kerrison punches to remove the medial facets at L3-4 bilaterally. We performed wide foraminotomies about the bilateral L3 and L4 nerve roots completing the decompression.  We now turned our attention to the posterior lumbar interbody fusion. I used a scalpel to incise the intervertebral disc at L3-4 bilaterally. I then performed a partial intervertebral discectomy at L3-4 bilaterally using the pituitary forceps. We prepared the vertebral endplates at L3-4 bilaterally for the fusion by removing the soft tissues with the curettes. We then used the trial spacers to pick the appropriate sized interbody prosthesis. We prefilled his prosthesis with a combination of local morselized autograft bone that we obtained during the decompression as well as Zimmer DBM. We inserted the prefilled prosthesis into the interspace at L3-4, we then turned and expanded the prosthesis. There was a good snug fit of the prosthesis in the interspace. We then filled and the remainder of the intervertebral disc space with local morselized autograft bone and Zimmer DBM. This completed the posterior lumbar interbody  arthrodesis.  During the decompression and insertion of the prosthesis the  assistant protected the thecal sac and nerve roots with the D'Errico retractor.  We now turned attention to the instrumentation. Under fluoroscopic guidance we cannulated the bilateral L3 pedicles with the bone probe. We then removed the bone probe. We then tapped the pedicle with a 6.5 millimeter tap. We then removed the tap. We probed inside the tapped pedicle with a ball probe to rule out cortical breaches. We then inserted a 7.5 x 50 millimeter pedicle screw into the L3 pedicles bilaterally under fluoroscopic guidance. We then palpated along the medial aspect of the pedicles to rule out cortical breaches. There were none. The nerve roots were not injured. We then connected the unilateral pedicle screws with a lordotic rod. We compressed the construct and secured the rod in place with the caps. We then tightened the caps appropriately. This completed the instrumentation from L3-L5 bilaterally.  We now turned our attention to the posterior lateral arthrodesis at L3-4. We used the high-speed drill to decorticate the remainder of the facets, pars, transverse process at L3-4. We then applied a combination of local morselized autograft bone and Zimmer DBM over these decorticated posterior lateral structures. This completed the posterior lateral arthrodesis.  We then obtained hemostasis using bipolar electrocautery. We irrigated the wound out with bacitracin solution. We inspected the thecal sac and nerve roots and noted they were well decompressed. We then removed the retractor.  We injected Exparel . We reapproximated patient's thoracolumbar fascia with interrupted #1 Vicryl suture. We reapproximated patient's subcutaneous tissue with interrupted 2-0 Vicryl suture. The reapproximated patient's skin with Steri-Strips and benzoin. The wound was then coated with bacitracin ointment. A sterile dressing was applied. The drapes were removed. The patient was subsequently returned to the supine position where they were  extubated by the anesthesia team. He was then transported to the post anesthesia care unit in stable condition. All sponge instrument and needle counts were reportedly correct at the end of this case.

## 2022-04-05 NOTE — Anesthesia Postprocedure Evaluation (Signed)
Anesthesia Post Note  Patient: Nathaniel Bush  Procedure(s) Performed: PLIF,IP,POSTERIOR INSTRUMENTATION L3-4;EXPLORE FUSION (Back)     Patient location during evaluation: PACU Anesthesia Type: General Level of consciousness: awake and alert Pain management: pain level controlled Vital Signs Assessment: post-procedure vital signs reviewed and stable Respiratory status: spontaneous breathing, nonlabored ventilation, respiratory function stable and patient connected to nasal cannula oxygen Cardiovascular status: blood pressure returned to baseline and stable Postop Assessment: no apparent nausea or vomiting Anesthetic complications: no   No notable events documented.  Last Vitals:  Vitals:   04/05/22 1200 04/05/22 1220  BP: 132/65 (!) 120/46  Pulse: (!) 56 (!) 54  Resp: 12 18  Temp: 36.8 C 36.9 C  SpO2: 98% 100%    Last Pain:  Vitals:   04/05/22 1230  TempSrc:   PainSc: 3                  Kennieth Rad

## 2022-04-05 NOTE — H&P (Signed)
Subjective: The patient is a 71 year old white male on whom I performed an L4-5 decompression, instrumentation and fusion years ago.  He is developed recurrent back and leg pain consistent with neurogenic claudication.  He has failed medical management.  He was worked up with a lumbar myelo CT which demonstrated the patient had spinal stenosis at L3-4.  I discussed the various treatment options with him.  He has decided to proceed with surgery.  Past Medical History:  Diagnosis Date   Arthritis    Depression    Dysrhythmia    aflutter   PAF (paroxysmal atrial fibrillation) (HCC)    Palpitations     Past Surgical History:  Procedure Laterality Date   A-FLUTTER ABLATION N/A 08/14/2019   Procedure: A-FLUTTER ABLATION;  Surgeon: Regan Lemming, MD;  Location: [facility];  Service: Cardiovascular;  Laterality: N/A;   INGUINAL HERNIA REPAIR     TOE SURGERY      No Known Allergies  Social History   Tobacco Use   Smoking status: Never   Smokeless tobacco: Never  Substance Use Topics   Alcohol use: Yes    Alcohol/week: 3.0 standard drinks of alcohol    Types: 2 Glasses of wine, 1 Cans of beer per week    Comment: daily    Family History  Problem Relation Age of Onset   Atrial fibrillation Father        pacemaker   Heart disease Paternal Grandmother        pacemaker   Prior to Admission medications   Medication Sig Start Date End Date Taking? Authorizing Provider  acetaminophen (TYLENOL) 500 MG tablet Take 1,000 mg by mouth every 6 (six) hours as needed (pain.).   Yes [provider]  ALPRAZolam (XANAX) 0.25 MG tablet Take 1 tablet (0.25 mg total) by mouth 2 (two) times daily as needed for anxiety. 10/20/21  Yes Cottle, Steva Ready., MD  lithium carbonate (ESKALITH) 450 MG CR tablet Take 1 tablet (450 mg total) by mouth daily. 03/26/22  Yes Cottle, Steva Ready., MD  lithium carbonate 300 MG capsule Take 2 capsules (600 mg total) by mouth daily. 03/26/22  Yes Cottle,  Steva Ready., MD  COVID-19 mRNA Vac-TriS, Pfizer, (PFIZER-BIONT COVID-19 VAC-TRIS) SUSP injection Inject into the muscle. 01/30/21   Judyann Munson, MD  ibuprofen (ADVIL) 200 MG tablet Take 400 mg by mouth every 8 (eight) hours as needed (pain.).    [provider]     Review of Systems  Positive ROS: As above  All other systems have been reviewed and were otherwise negative with the exception of those mentioned in the HPI and as above.  Objective: Vital signs in last 24 hours: Temp:  [98.1 F (36.7 C)] 98.1 F (36.7 C) (07/06 0614) Pulse Rate:  [50] 50 (07/06 0614) Resp:  [18] 18 (07/06 0614) BP: (151)/(73) 151/73 (07/06 0614) SpO2:  [97 %] 97 % (07/06 0614) Weight:  [75.8 kg] 75.8 kg (07/06 0614) Estimated body mass index is 23.96 kg/m as calculated from the following:   Height as of this encounter: 5\' 10"  (1.778 m).   Weight as of this encounter: 75.8 kg.   General Appearance: Alert Head: Normocephalic, without obvious abnormality, atraumatic Eyes: PERRL, conjunctiva/corneas clear, EOM's intact,    Ears: Normal  Throat: Normal  Neck: Supple, Back: His lumbar incision is well-healed. Lungs: Clear to auscultation bilaterally, respirations unlabored Heart: Regular rate and rhythm, no murmur, rub or gallop Abdomen: Soft, non-tender Extremities: Extremities  normal, atraumatic, no cyanosis or edema Skin: unremarkable  NEUROLOGIC:   Mental status: alert and oriented,Motor Exam - grossly normal Sensory Exam - grossly normal Reflexes:  Coordination - grossly normal Gait - grossly normal Balance - grossly normal Cranial Nerves: I: smell Not tested  II: visual acuity  OS: Normal  OD: Normal   II: visual fields Full to confrontation  II: pupils Equal, round, reactive to light  III,VII: ptosis None  III,IV,VI: extraocular muscles  Full ROM  V: mastication Normal  V: facial light touch sensation  Normal  V,VII: corneal reflex  Present  VII: facial muscle function  - upper  Normal  VII: facial muscle function - lower Normal  VIII: hearing Not tested  IX: soft palate elevation  Normal  IX,X: gag reflex Present  XI: trapezius strength  5/5  XI: sternocleidomastoid strength 5/5  XI: neck flexion strength  5/5  XII: tongue strength  Normal    Data Review Lab Results  Component Value Date   WBC 6.2 03/29/2022   HGB 14.0 03/29/2022   HCT 40.5 03/29/2022   MCV 90.6 03/29/2022   PLT 169 03/29/2022   Lab Results  Component Value Date   NA 140 03/29/2022   K 3.8 03/29/2022   CL 106 03/29/2022   CO2 24 03/29/2022   BUN 15 03/29/2022   CREATININE 0.99 03/29/2022   GLUCOSE 101 (H) 03/29/2022   Lab Results  Component Value Date   INR 1.0 09/24/2019    Assessment/Plan: Lumbar spinal stenosis, lumbago, lumbar radiculopathy, neurogenic claudication: I have discussed the situation with the patient.  I reviewed his image studies with him and pointed out the abnormalities.  We have discussed the various treatment options including surgery.  I described the surgical treatment option of an L3-4 decompression, instrumentation and fusion.  I have shown him surgical models.  I have given him a surgical pamphlet.  We have discussed the risk, benefits, alternatives, expected postop course, and likelihood of achieving our goals with surgery.  I have answered all his questions.  He has decided proceed with surgery.   Cristi Loron 04/05/2022 7:21 AM

## 2022-04-05 NOTE — Anesthesia Procedure Notes (Signed)
Procedure Name: Intubation Date/Time: 04/05/2022 7:58 AM  Performed by: Erick Colace, CRNAPre-anesthesia Checklist: Patient identified, Emergency Drugs available, Suction available and Patient being monitored Patient Re-evaluated:Patient Re-evaluated prior to induction Oxygen Delivery Method: Circle system utilized Preoxygenation: Pre-oxygenation with 100% oxygen Induction Type: IV induction and Cricoid Pressure applied Ventilation: Mask ventilation without difficulty Laryngoscope Size: Mac and 4 Grade View: Grade II Tube type: Oral Tube size: 7.5 mm Number of attempts: 1 Airway Equipment and Method: Stylet and Oral airway Placement Confirmation: ETT inserted through vocal cords under direct vision, positive ETCO2 and breath sounds checked- equal and bilateral Secured at: 22 cm Tube secured with: Tape Dental Injury: Teeth and Oropharynx as per pre-operative assessment  Comments: Teeth chipped prior to intubation.

## 2022-04-05 NOTE — Progress Notes (Signed)
Orthopedic Tech Progress Note Patient Details:  Nathaniel Bush 03/11/1951 [id number]  Ortho Devices Type of Ortho Device: Lumbar corsett Ortho Device/Splint Interventions: Ordered   Post Interventions Patient Tolerated: Well  Alahia Whicker A Conny Moening 04/05/2022, 4:05 PM

## 2022-04-05 NOTE — Plan of Care (Signed)
Pt doing well. Pt given D/C instructions with verbal understanding. Rx's were sent to the pharmacy by MD. Pt's incision is clean and dry with no sign of infection. Pt's IV was removed prior to D/C. Pt D/C'd home via wheelchair per MD order. Pt is stable @ D/C and has no other needs at this time. Aberdeen Hafen, RN  

## 2022-04-06 ENCOUNTER — Other Ambulatory Visit: Payer: Self-pay | Admitting: Neurosurgery

## 2022-04-11 MED FILL — Heparin Sodium (Porcine) Inj 1000 Unit/ML: INTRAMUSCULAR | Qty: 30 | Status: AC

## 2022-04-11 MED FILL — Sodium Chloride IV Soln 0.9%: INTRAVENOUS | Qty: 1000 | Status: AC

## 2022-04-15 ENCOUNTER — Other Ambulatory Visit: Payer: Self-pay | Admitting: Psychiatry

## 2022-04-15 DIAGNOSIS — F319 Bipolar disorder, unspecified: Secondary | ICD-10-CM

## 2022-04-18 ENCOUNTER — Telehealth: Payer: Self-pay | Admitting: Cardiology

## 2022-04-18 NOTE — Telephone Encounter (Signed)
Pt states that he had an ekg about a week ago from spinal surgery and it showed an abnormal arrythmia, he did not remember the medical term. He states that his own personal ekg machine showed the same thing. Aware that Dr. Antoine Poche and Dr. Elberta Fortis are out of the office and will get back to him as soon as they can. He states he is not in any pain, he is just a little concerned.

## 2022-04-18 NOTE — Telephone Encounter (Signed)
Spoke to patient, patient reports concerns about an abnormal EKG at his preop prior to his recent surgery.  He states he saw Dr. Antoine Poche after this and had another EKG-was told this was ok and nothing to worry about.  Advised EKG from preop was available to Dr. Antoine Poche at his appt-no concerns.   Patient verbalized understanding.

## 2022-04-20 NOTE — Telephone Encounter (Signed)
Called patient, advised of message below from MD.  Patient verbalized understanding.  

## 2022-04-22 ENCOUNTER — Encounter: Payer: Self-pay | Admitting: Cardiology

## 2022-04-23 NOTE — Progress Notes (Unsigned)
Cardiology Clinic Note   Patient Name: Edd Rowden Date of Encounter: 04/24/2022  Primary Care Provider:  Crist Infante, MD Primary Cardiologist:  Minus Breeding, MD  Patient Profile    Kacen Fossen 71 year old male presents to the clinic today for evaluation of his atrial fibrillation/atrial flutter.  Past Medical History    Past Medical History:  Diagnosis Date   Arthritis    Depression    Dysrhythmia    aflutter   PAF (paroxysmal atrial fibrillation) ([place])    Palpitations    Past Surgical History:  Procedure Laterality Date   A-FLUTTER ABLATION N/A 08/14/2019   Procedure: A-FLUTTER ABLATION;  Surgeon: Constance Haw, MD;  Location: [place] [facility];  Service: Cardiovascular;  Laterality: N/A;   INGUINAL HERNIA REPAIR     TOE SURGERY      Allergies  No Known Allergies  History of Present Illness    Hedley Student is a PMH of depression, paroxysmal atrial fibrillation, palpitations, atrial flutter and is status post atrial flutter ablation by Dr. Curt Bears 08/14/2019.  He underwent coronary calcium scoring which showed 89.4 and put him in the 48th percentile.  He was seen in follow-up by Dr. Percival Spanish on 04/02/2022.  During that time he had no cardiac complaints.  He continued exercise.  He reported riding his bike.  He no other back fusion was discussed.  He denied new symptoms of chest discomfort arm and neck discomfort.  He denies shortness of breath orthopnea and PND.  He denied palpitations presyncope and syncope.  He contacted the nurse triage line on 04/23/2022 with concern for his iWatch alerting him that he was having atrial fibrillation.  He presents to clinic today for follow-up evaluation states he has noticed alerts on his iWatch.  His EKG today shows sinus rhythm with marked sinus arrhythmia and first-degree AV block.  He continues to be very physically active walking 2 times per day and riding his bike 10 miles per day.  He does not feel  palpitations.  He denies dizzy spells.  We reviewed the importance of maintaining p.o. hydration and the pathophysiology of increased heart rate/palpitations.  He reports that he has not been very good at maintaining hydration.  I will order a 30-day cardiac event monitor.  We will plan follow-up after the cardiac event monitor.  Today he denies chest pain, shortness of breath, lower extremity edema, fatigue, palpitations, melena, hematuria, hemoptysis, diaphoresis, weakness, presyncope, syncope, orthopnea, and PND.    Home Medications    Prior to Admission medications   Medication Sig Start Date End Date Taking? Authorizing Provider  acetaminophen (TYLENOL) 500 MG tablet Take 1,000 mg by mouth every 6 (six) hours as needed (pain.).    [provider]  ALPRAZolam Duanne Moron) 0.25 MG tablet Take 1 tablet (0.25 mg total) by mouth 2 (two) times daily as needed for anxiety. 10/20/21   Cottle, Billey Co., MD  COVID-19 mRNA Vac-TriS, Pfizer, (PFIZER-BIONT COVID-19 VAC-TRIS) SUSP injection Inject into the muscle. 01/30/21   Carlyle Basques, MD  cyclobenzaprine (FLEXERIL) 5 MG tablet Take 1 tablet (5 mg total) by mouth 3 (three) times daily as needed for muscle spasms. 04/05/22   Newman Pies, MD  docusate sodium (COLACE) 100 MG capsule Take 1 capsule (100 mg total) by mouth 2 (two) times daily. 04/05/22   Newman Pies, MD  lithium carbonate (ESKALITH) 450 MG CR tablet Take 1 tablet by mouth daily. 04/16/22   Cottle, Billey Co., MD  lithium carbonate 300  MG capsule Take 2 capsules (600 mg total) by mouth daily. Patient taking differently: Take 600 mg by mouth at bedtime. Take with Lithium CR 450mg  tablet at bedtime 03/26/22   Cottle, 03/28/22., MD  oxyCODONE-acetaminophen (PERCOCET/ROXICET) 5-325 MG tablet Take 1-2 tablets by mouth every 4 (four) hours as needed for moderate pain. 04/05/22   06/06/22, MD    Family History    Family History  Problem Relation Age of Onset   Atrial  fibrillation Father        pacemaker   Heart disease Paternal Grandmother        pacemaker   He indicated that his mother is alive. He indicated that his father is deceased. He indicated that his paternal grandmother is deceased.  Social History    Social History   Socioeconomic History   Marital status: Married    Spouse name: Not on file   Number of children: 2   Years of education: Not on file   Highest education level: Not on file  Occupational History   Not on file  Tobacco Use   Smoking status: Never   Smokeless tobacco: Never  Vaping Use   Vaping Use: Never used  Substance and Sexual Activity   Alcohol use: Yes    Alcohol/week: 3.0 standard drinks of alcohol    Types: 2 Glasses of wine, 1 Cans of beer per week    Comment: daily   Drug use: Never   Sexual activity: Not on file  Other Topics Concern   Not on file  Social History Narrative   Lives at home with wife.  Retired [employer].    Social Determinants of Health   Financial Resource Strain: Not on file  Food Insecurity: Not on file  Transportation Needs: Not on file  Physical Activity: Not on file  Stress: Not on file  Social Connections: Not on file  Intimate Partner Violence: Not on file     Review of Systems    General:  No chills, fever, night sweats or weight changes.  Cardiovascular:  No chest pain, dyspnea on exertion, edema, orthopnea, palpitations, paroxysmal nocturnal dyspnea. Dermatological: No rash, lesions/masses Respiratory: No cough, dyspnea Urologic: No hematuria, dysuria Abdominal:   No nausea, vomiting, diarrhea, bright red blood per rectum, melena, or hematemesis Neurologic:  No visual changes, wkns, changes in mental status. All other systems reviewed and are otherwise negative except as noted above.  Physical Exam    VS:  BP 126/68   Pulse 62   Ht 5\' 10"  (1.778 m)   Wt 168 lb (76.2 kg)   SpO2 96%   BMI 24.11 kg/m  , BMI Body mass index is 24.11 kg/m. GEN: Well  nourished, well developed, in no acute distress. HEENT: normal. Neck: Supple, no JVD, carotid bruits, or masses. Cardiac: RRR, no murmurs, rubs, or gallops. No clubbing, cyanosis, edema.  Radials/DP/PT 2+ and equal bilaterally.  Respiratory:  Respirations regular and unlabored, clear to auscultation bilaterally. GI: Soft, nontender, nondistended, BS + x 4. MS: no deformity or atrophy. Skin: warm and dry, no rash. Neuro:  Strength and sensation are intact. Psych: Normal affect.  Accessory Clinical Findings    Recent Labs: 03/29/2022: BUN 15; Creatinine, Ser 0.99; Hemoglobin 14.0; Platelets 169; Potassium 3.8; Sodium 140   Recent Lipid Panel    Component Value Date/Time   CHOL 153 03/04/2020 0856   TRIG 69 03/04/2020 0856   HDL 63 03/04/2020 0856   CHOLHDL 2.4 03/04/2020 0856  LDLCALC 76 03/04/2020 0856    ECG personally reviewed by me today-sinus rhythm with marked sinus arrhythmia with first-degree AV block 62 bpm- No acute changes  CT cardiac scoring  IMPRESSION: 1. Total Agatston score of 89.4, corresponding to 46th percentile for age, sex, and race based cohort. 2. Left lower lobe pulmonary nodule, on the order of 7 mm. Non-contrast chest CT at 6-12 months is recommended. If the nodule is stable at time of repeat CT, then future CT at 18-24 months (from today's scan) is considered optional for low-risk patients, but is recommended for high-risk patients. This recommendation follows the consensus statement: Guidelines for Management of Incidental Pulmonary Nodules Detected on CT Images: From the Fleischner Society 2017; Radiology 2017; 284:228-243.   These results will be called to the ordering clinician or representative by the Radiologist Assistant, and communication documented in the PACS or [employer].     Electronically Signed   By: Jeronimo Greaves M.D.   On: 02/16/2020 14:40  Assessment & Plan   1.  Palpitations-alerted of possible atrial flutter via  Apple Watch 04/23/2022.  EKG today shows sinus rhythm with marked sinus arrhythmia with first-degree AV block.  Cardiac unaware. Avoid triggers caffeine, chocolate, EtOH, dehydration etc. Order 30-day cardiac event monitor  Sinus bradycardia-heart rate today 62 bpm Continue current medical therapy Heart healthy low-sodium diet-salty 6 given Increase physical activity as tolerated   Hyperlipidemia-LDL 99 on 05/10/2021 Heart healthy low-sodium high-fiber diet Increase physical activity as tolerated Follows with PCP  Disposition: Follow-up with Dr. Antoine Poche or me in 2 months.   Thomasene Ripple. Vennie Waymire NP-C     04/24/2022, 3:14 PM [facility] [address] Office [phone] Fax [phone]  Notice: This dictation was prepared with Dragon dictation along with smaller phrase technology. Any transcriptional errors that result from this process are unintentional and may not be corrected upon review.  I spent 14 minutes examining this patient, reviewing medications, and using patient centered shared decision making involving her cardiac care.  Prior to her visit I spent greater than 20 minutes reviewing her past medical history,  medications, and prior cardiac tests.

## 2022-04-23 NOTE — Telephone Encounter (Signed)
I contacted patient after receiving EKG strips form over the weekend- patient states he is not having any symptoms and overall feels fine, but I felt with his flutter ablation back in 2020 it would be best to be seen to evaluate further and make a plan.   Opening for APP tomorrow at 2:45 PM.  Patient agreeable to come in and be seen.   Will route to APP/MD to make aware.  Thanks!

## 2022-04-24 ENCOUNTER — Ambulatory Visit: Payer: Medicare Other | Admitting: General Practice

## 2022-04-24 ENCOUNTER — Encounter: Payer: Self-pay | Admitting: General Practice

## 2022-04-24 VITALS — BP 126/68 | HR 62 | Ht 70.0 in | Wt 168.0 lb

## 2022-04-24 DIAGNOSIS — R002 Palpitations: Secondary | ICD-10-CM

## 2022-04-24 DIAGNOSIS — R001 Bradycardia, unspecified: Secondary | ICD-10-CM | POA: Diagnosis not present

## 2022-04-24 DIAGNOSIS — E785 Hyperlipidemia, unspecified: Secondary | ICD-10-CM | POA: Diagnosis not present

## 2022-04-24 DIAGNOSIS — I491 Atrial premature depolarization: Secondary | ICD-10-CM | POA: Diagnosis not present

## 2022-04-24 NOTE — Patient Instructions (Signed)
Medication Instructions:  The current medical regimen is effective;  continue present plan and medications as directed. Please refer to the Current Medication list given to you today.   *If you need a refill on your cardiac medications before your next appointment, please call your pharmacy*  Lab Work:   Testing/Procedures:  NONE    NONE If you have labs (blood work) drawn today and your tests are completely normal, you will receive your results only by: MyChart Message (if you have MyChart) OR  A paper copy in the mail If you have any lab test that is abnormal or we need to change your treatment, we will call you to review the results.  Special Instructions PLEASE MAINTAIN PHYSICAL ACTIVITY  INCREASE HYDRATION  PLEASE READ AND FOLLOW HEART HEALTHY DIET-ATTACHED  30 DAY EVENT MONITOR-THIS WILL BE MAILED TO YOU WITH INSTRUCTIONS.  Follow-Up: Your next appointment:  2-3 month(s) In Person with Rollene Rotunda, MD  or Edd Fabian, FNP        At [facility], you and your health needs are our priority.  As part of our continuing mission to provide you with exceptional heart care, we have created designated Provider Care Teams.  These Care Teams include your primary Cardiologist (physician) and Advanced Practice Providers (APPs -  Physician Assistants and Nurse Practitioners) who all work together to provide you with the care you need, when you need it.  Important Information About Sugar     Heart-Healthy Eating Plan Heart-healthy meal planning includes: Eating less unhealthy fats. Eating more healthy fats. Making other changes in your diet. Talk with your doctor or a diet specialist (dietitian) to create an eating plan that is right for you. What is my plan?  What are tips for following this plan? Cooking Avoid frying your food. Try to bake, boil, grill, or broil it instead. You can also reduce fat by: Removing the skin from poultry. Removing all visible fats from  meats. Steaming vegetables in water or broth. Meal planning  At meals, divide your plate into four equal parts: Fill one-half of your plate with vegetables and green salads. Fill one-fourth of your plate with whole grains. Fill one-fourth of your plate with lean protein foods. Eat 4-5 servings of vegetables per day. A serving of vegetables is: 1 cup of raw or cooked vegetables. 2 cups of raw leafy greens. Eat 4-5 servings of fruit per day. A serving of fruit is: 1 medium whole fruit.  cup of dried fruit.  cup of fresh, frozen, or canned fruit.  cup of 100% fruit juice. Eat more foods that have soluble fiber. These are apples, broccoli, carrots, beans, peas, and barley. Try to get 20-30 g of fiber per day. Eat 4-5 servings of nuts, legumes, and seeds per week: 1 serving of dried beans or legumes equals  cup after being cooked. 1 serving of nuts is  cup. 1 serving of seeds equals 1 tablespoon. General information Eat more home-cooked food. Eat less restaurant, buffet, and fast food. Limit or avoid alcohol. Limit foods that are high in starch and sugar. Avoid fried foods. Lose weight if you are overweight. Keep track of how much salt (sodium) you eat. This is important if you have high blood pressure. Ask your doctor to tell you more about this. Try to add vegetarian meals each week. Fats Choose healthy fats. These include olive oil and canola oil, flaxseeds, walnuts, almonds, and seeds. Eat more omega-3 fats. These include salmon, mackerel, sardines, tuna, flaxseed oil,  and ground flaxseeds. Try to eat fish at least 2 times each week. Check food labels. Avoid foods with trans fats or high amounts of saturated fat. Limit saturated fats. These are often found in animal products, such as meats, butter, and cream. These are also found in plant foods, such as palm oil, palm kernel oil, and coconut oil. Avoid foods with partially hydrogenated oils in them. These have trans fats.  Examples are stick margarine, some tub margarines, cookies, crackers, and other baked goods. What foods can I eat? Fruits All fresh, canned (in natural juice), or frozen fruits. Vegetables Fresh or frozen vegetables (raw, steamed, roasted, or grilled). Green salads. Grains Most grains. Choose whole wheat and whole grains most of the time. Rice and pasta, including brown rice and pastas made with whole wheat. Meats and other proteins Lean, well-trimmed beef, veal, pork, and lamb. Chicken and [place] without skin. All fish and shellfish. Wild duck, rabbit, pheasant, and venison. Egg whites or low-cholesterol egg substitutes. Dried beans, peas, lentils, and tofu. Seeds and most nuts. Dairy Low-fat or nonfat cheeses, including ricotta and mozzarella. Skim or 1% milk that is liquid, powdered, or evaporated. Buttermilk that is made with low-fat milk. Nonfat or low-fat yogurt. Fats and oils Non-hydrogenated (trans-free) margarines. Vegetable oils, including soybean, sesame, sunflower, olive, peanut, safflower, corn, canola, and cottonseed. Salad dressings or mayonnaise made with a vegetable oil. Beverages Mineral water. Coffee and tea. Diet carbonated beverages. Sweets and desserts Sherbet, gelatin, and fruit ice. Small amounts of dark chocolate. Limit all sweets and desserts. Seasonings and condiments All seasonings and condiments. The items listed above may not be a complete list of foods and drinks you can eat. Contact a dietitian for more options. What foods should I avoid? Fruits Canned fruit in heavy syrup. Fruit in cream or butter sauce. Fried fruit. Limit coconut. Vegetables Vegetables cooked in cheese, cream, or butter sauce. Fried vegetables. Grains Breads that are made with saturated or trans fats, oils, or whole milk. Croissants. Sweet rolls. Donuts. High-fat crackers, such as cheese crackers. Meats and other proteins Fatty meats, such as hot dogs, ribs, sausage, bacon, rib-eye  roast or steak. High-fat deli meats, such as salami and bologna. Caviar. Domestic duck and goose. Organ meats, such as liver. Dairy Cream, sour cream, cream cheese, and creamed cottage cheese. Whole-milk cheeses. Whole or 2% milk that is liquid, evaporated, or condensed. Whole buttermilk. Cream sauce or high-fat cheese sauce. Yogurt that is made from whole milk. Fats and oils Meat fat, or shortening. Cocoa butter, hydrogenated oils, palm oil, coconut oil, palm kernel oil. Solid fats and shortenings, including bacon fat, salt pork, lard, and butter. Nondairy cream substitutes. Salad dressings with cheese or sour cream. Beverages Regular sodas and juice drinks with added sugar. Sweets and desserts Frosting. Pudding. Cookies. Cakes. Pies. Milk chocolate or white chocolate. Buttered syrups. Full-fat ice cream or ice cream drinks. The items listed above may not be a complete list of foods and drinks to avoid. Contact a dietitian for more information. Summary Heart-healthy meal planning includes eating less unhealthy fats, eating more healthy fats, and making other changes in your diet. Eat a balanced diet. This includes fruits and vegetables, low-fat or nonfat dairy, lean protein, nuts and legumes, whole grains, and heart-healthy oils and fats. This information is not intended to replace advice given to you by your health care provider. Make sure you discuss any questions you have with your health care provider. Document Revised: 01/26/2021 Document Reviewed: 01/26/2021 Elsevier Patient Education  2022 [employer]

## 2022-04-29 ENCOUNTER — Ambulatory Visit: Payer: Medicare Other | Attending: General Practice

## 2022-04-29 DIAGNOSIS — I491 Atrial premature depolarization: Secondary | ICD-10-CM

## 2022-04-29 DIAGNOSIS — R002 Palpitations: Secondary | ICD-10-CM | POA: Diagnosis not present

## 2022-05-17 ENCOUNTER — Other Ambulatory Visit: Payer: Self-pay

## 2022-05-17 ENCOUNTER — Telehealth: Payer: Self-pay | Admitting: Psychiatry

## 2022-05-17 DIAGNOSIS — F319 Bipolar disorder, unspecified: Secondary | ICD-10-CM

## 2022-05-17 MED ORDER — LITHIUM CARBONATE 300 MG PO CAPS: 600.0000 mg | ORAL_CAPSULE | Freq: Every day | ORAL | 0 refills | Status: DC

## 2022-05-17 NOTE — Telephone Encounter (Addendum)
Pt LVM reporting  following visit about 6 weeks ago, tremors in arm and leg continued after increasing Lithium to 750 mg. Pt decreased Lithium to 600 mg tremors stopped this time and no increase in depression at this time. Pt requesting Rx for 600 mg to [facility] for 90 day. CC  sent Rx to [facility] for increased dose. [employer] contacted pt he never confirmed to mail to him. So he never got it. Getting close to running out. Contact pt @ [phone] next apt 1/2

## 2022-05-17 NOTE — Telephone Encounter (Signed)
Rx sent 

## 2022-05-17 NOTE — Telephone Encounter (Signed)
Please review,ok to send 600 mg?

## 2022-05-17 NOTE — Telephone Encounter (Signed)
Yes. Thanks 300mg  capsules , 2 daily , #180 no refills

## 2022-05-21 ENCOUNTER — Ambulatory Visit: Payer: Medicare Other | Attending: Internal Medicine

## 2022-05-21 DIAGNOSIS — Z23 Encounter for immunization: Secondary | ICD-10-CM

## 2022-06-11 ENCOUNTER — Other Ambulatory Visit: Payer: Self-pay | Admitting: Internal Medicine

## 2022-06-11 DIAGNOSIS — R918 Other nonspecific abnormal finding of lung field: Secondary | ICD-10-CM

## 2022-06-13 ENCOUNTER — Encounter: Payer: Self-pay | Admitting: Cardiology

## 2022-06-23 ENCOUNTER — Other Ambulatory Visit: Payer: Self-pay | Admitting: Psychiatry

## 2022-06-23 DIAGNOSIS — F411 Generalized anxiety disorder: Secondary | ICD-10-CM

## 2022-06-25 ENCOUNTER — Ambulatory Visit: Payer: Medicare Other | Admitting: Cardiology

## 2022-07-27 ENCOUNTER — Telehealth: Payer: Self-pay | Admitting: Psychiatry

## 2022-07-27 ENCOUNTER — Other Ambulatory Visit: Payer: Self-pay | Admitting: Psychiatry

## 2022-07-27 DIAGNOSIS — F319 Bipolar disorder, unspecified: Secondary | ICD-10-CM

## 2022-07-27 NOTE — Telephone Encounter (Signed)
Pt stated tremors stopped but he is having increased anxiety on 600 mg of lithium please advise

## 2022-07-27 NOTE — Telephone Encounter (Signed)
Pt LVM med issue with Lithium was taking 750 mg  was having tremors. CC decreased to 600 mg stopped now having anxiety. Contact # [phone]. Pt has 90 day supply of 300 mg. Any other dose needed send to [facility]    apt 1/2

## 2022-07-27 NOTE — Telephone Encounter (Signed)
Continue lithium level as soon as possible.  I sent an order to [facility].  I do not see a lithium level on the chart this year.  In the meantime he should use the Xanax which he has until we can get the lithium level back.

## 2022-07-27 NOTE — Telephone Encounter (Signed)
Pt got lithium lab done last month he will get it sent over to [place]

## 2022-07-31 ENCOUNTER — Encounter: Payer: Self-pay | Admitting: Psychiatry

## 2022-07-31 NOTE — Telephone Encounter (Signed)
Lab results have been scanned and I have a copy as well

## 2022-07-31 NOTE — Telephone Encounter (Signed)
I put it in your box

## 2022-07-31 NOTE — Telephone Encounter (Signed)
I don't see lithium level in Epic.  Pls get me a hard copy

## 2022-08-03 ENCOUNTER — Telehealth: Payer: Self-pay | Admitting: Psychiatry

## 2022-08-03 NOTE — Telephone Encounter (Signed)
Spoke to pt and he did f/u about urine

## 2022-08-03 NOTE — Telephone Encounter (Signed)
Lihtium level 0.7 on 600 mg daily which is about what the level has remained over the last few years.  It is stable.  I don't think we should increase or he is likely to have tremors.  I suggest he use the Xanax for the anxiety he has been noticing.    The rest of his labs were normal (including Cr and Ca) except he needs to follow up with his doctor about the red blood cells in his urine as his PCP suggested.  This is unrelated to lithium.

## 2022-08-03 NOTE — Telephone Encounter (Signed)
Pt lvm stating labs sent to [place] and checking status on Rx. RTC [phone]

## 2022-08-03 NOTE — Telephone Encounter (Signed)
Please review

## 2022-08-15 ENCOUNTER — Other Ambulatory Visit: Payer: Medicare Other

## 2022-08-17 ENCOUNTER — Other Ambulatory Visit (HOSPITAL_BASED_OUTPATIENT_CLINIC_OR_DEPARTMENT_OTHER): Payer: Self-pay

## 2022-08-20 ENCOUNTER — Other Ambulatory Visit (HOSPITAL_BASED_OUTPATIENT_CLINIC_OR_DEPARTMENT_OTHER): Payer: Self-pay

## 2022-08-20 MED ORDER — COMIRNATY 30 MCG/0.3ML IM SUSY
PREFILLED_SYRINGE | INTRAMUSCULAR | 0 refills | Status: DC
  Filled 2022-08-20: qty 0.3, 1d supply, fill #0

## 2022-08-21 ENCOUNTER — Other Ambulatory Visit: Payer: Self-pay | Admitting: Psychiatry

## 2022-08-21 DIAGNOSIS — F319 Bipolar disorder, unspecified: Secondary | ICD-10-CM

## 2022-08-22 ENCOUNTER — Encounter: Payer: Self-pay | Admitting: Psychiatry

## 2022-08-29 ENCOUNTER — Other Ambulatory Visit (HOSPITAL_BASED_OUTPATIENT_CLINIC_OR_DEPARTMENT_OTHER): Payer: Self-pay

## 2022-08-29 MED ORDER — PREVNAR 20 0.5 ML IM SUSY
PREFILLED_SYRINGE | INTRAMUSCULAR | 0 refills | Status: DC
Start: 2022-08-29 — End: 2023-01-01
  Filled 2022-08-29: qty 0.5, 1d supply, fill #0

## 2022-10-02 ENCOUNTER — Encounter: Payer: Self-pay | Admitting: Psychiatry

## 2022-10-02 ENCOUNTER — Ambulatory Visit (INDEPENDENT_AMBULATORY_CARE_PROVIDER_SITE_OTHER): Payer: Medicare Other | Admitting: Psychiatry

## 2022-10-02 DIAGNOSIS — F411 Generalized anxiety disorder: Secondary | ICD-10-CM

## 2022-10-02 DIAGNOSIS — F319 Bipolar disorder, unspecified: Secondary | ICD-10-CM | POA: Diagnosis not present

## 2022-10-02 DIAGNOSIS — F4310 Post-traumatic stress disorder, unspecified: Secondary | ICD-10-CM

## 2022-10-02 DIAGNOSIS — Z79899 Other long term (current) drug therapy: Secondary | ICD-10-CM

## 2022-10-02 DIAGNOSIS — H9312 Tinnitus, left ear: Secondary | ICD-10-CM

## 2022-10-02 MED ORDER — GABAPENTIN 100 MG PO CAPS: ORAL_CAPSULE | ORAL | 0 refills | Status: DC

## 2022-10-02 NOTE — Progress Notes (Signed)
Nathaniel Bush [id number] [birth year] 72 y.o.  Subjective:   [name] is a 72 y.o. (DOB 23-May-1951) male.  Chief Complaint:  Chief Complaint  Patient presents with   Follow-up    Bipolar affective disorder, rapid cycling [facility])   Anxiety   Post-Traumatic Stress Disorder    HPI   Nathaniel Bush presents to the office today for follow-up of depression and anxiety.    seen in March 16, 2019.  It was relatively stable except for marital stress.  No meds were changed.  09/15/2019 appointment with the following noted: Had Aflutter with successful ablation. Overall mood good without change since here.  Nothing's changed.  Including meds.  Handling stress of socialization and Covid very well.  No unusual issues.. Really slowed down watching the news bc creating stress. Not taking much Xanax.  No problems with the lithium.  No meds were changed.  03/14/2020 appointment with the following noted: Back fusion Christmas.  Doing great with that.  Dr. Arnoldo Bush.  Riding bike in 3-4 weeks. 10 years ago would be down and thinking neg things when first awakens in morning and back that way again.  Stressor wife unhappy with her job and complains of it a lot and wife doesn't like things going on in the family relationships.  These don't help her dealing with him bc she doesn't treat him well.  Her work is getting better and that is helping so he's more hopeful.  Not really look for med changes. Rare Xanax helps him remain calm with wife and tolerates it well.  Plan: no med changes except suggest vit D in winter  04/14/20 TC: Patient called and said that over the last two weeks he has been hearing what people say to him but can't understand the words or what they mean. He said it was like his brain is in a fog. He also said that his right hand is shaking or have tremors. He wants to know is this the cause of the lithium . He just had a physical and all is good there MD response: Reduce  lithium from 750 mg to 600   mg daily and see if that helps.  06/14/2020 phone call patient stating he started feeling bad in the morning after reducing lithium to 600 mg daily and he increase it back to 750 mg.  He called complaining he was still having some depression.  It was suggested he move up his appointment.  09/12/20 appt with following noted: Currently back to 600 mg lithium.  Had gone back up to 750 mg.  Now doesn't think it  was meds making him feel bad and he got better and  Now back to 600 mg lithium for 6 weeks.  Doing well mood wise now.  Same marital issues and handling it as best he can.  Journaling is therapeutic and grounding.  Want to talk about it but she is unapproachable.  Thinking about counseling again.  [name].  Says he shows her his best side everyday.  She refuses to be close to him or touch him.   Rare Xanax. Tremor gone. Plan: Overall not depressed.  No med changes today  01/09/2021 appointment with the following noted: In jan taking lithium 450 and level 0.5. Then started feeling more stressed with wife and decided to increase to  Wife gotten meaner and wants to talk down to hip and resents that.  Xanax helps but not sure lithium helps.  Only used about once  weekly. When initially wakes thinks of wife and gets down.  Can't talk with wife bc she gets angry.  He feels powerless to do anything about it. He feels wife has deep seated unknown resentment against him but refuses to go to counseling with him. Increased lithium to 750 mg about Feb without any changes noticed.   Sleep good.   No excess alcohol. Plan: Continue lithium but need to check labs.  03/13/2021 appt noted: Disc marital situation.   She denied resentment against him.  But then started a litany of resentments for years.  Felt good he was right.  She started to try  be nicer. Journals and it helps. But still feels terrible.  Has done the housework.  She wants to travel and he doesn't want to do it  if she doesn't care for him. More impatient with drivers again lately.  I don't know how to take the edge off that.  Blows horn too much.  Can be patient in other circumstances.  Gets annoyed with stoplights.  Depressed about situation with wife.  Doesn't think he's overall depressed.  Still awakens thinking of marriage.  Says he didn't have a good attitude about taking meds in the past and willing to retry meds. No sex and wife asexual. Not depressed but frustrated with marriage. Patient reports stable mood and denies depressed or irritable moods.  Patient denies any recent difficulty with anxiety.  Patient denies difficulty with sleep initiation or maintenance. Denies appetite disturbance.  Patient reports that energy and motivation have been good.  Patient denies any difficulty with concentration.  Patient denies any suicidal ideation. Plan: Continue lithium 750 mg daily. Start sertraline 25 mg 1/2 daily for 1 week then 1 daily for irritability and depressive sx  03/29/2021 phone call complaining of GI distress from sertraline 25 mg daily.  He is very med sensitive so he was encouraged to try 12.5 mg daily.  07/31/21 appt noted: Sertraline for 2 weeks and stopped DT GI problems. Mood in general good.  Rad on bipolar disorder. Notices there's times he feels happy and does things he regrets the next day.  Not a big issue but at least I'm paying attention and notices.  Not going in as deep into depression.  Not as much letting wife getting to him. taking lithium 750 mg daily. Bummed he just turned 72 yo.  He's still biking. M had MI Seeing therapist Nathaniel Bush. No med changes today and he agrees with the exception of adding vitamin D  Continue lithium 750 mg daily.  10/19/21 appt ntoed: Lithium 0.8 Nathaniel Bush's office Mild intermittent intentional tremor.  Not severe. Real emotional through the holidays, overly, intense. Sentimental with movie.  Also get very mad but not at people but at  things when doing projects.  Always polite in public. Therapy, [name],  has helped improve how he handles Bonita QuinLinda, coping better with it. Anxiety in waves.  A lot of stress with back pain with injections 1 week ago helped. Only thing that works is 1/2 Xanax prn but still using bottle from April will work in 15 mins. Plan: No med changes today and he agrees with the exception of adding vitamin D  Continue lithium 750 mg daily. Lithium level per PCP. Pt says it was good but would like to get it.  03/26/22 appt noted: Tested positive for Covid 2 weeks ago. No severe sx. Planning back surgery.  No Covid SX. Mood good last week. Therapist hleped a  lot with coping with conflict with Nathaniel Bush.  Handling it better usually.  She gets into resentment about things 40 years ago.  She has a list of resentments.  She weaponizes it.  Really pleased with therapy helpful. She never says "I'm sorry". Zella Ball, PhD.  Then he gets upset and tremors then and gets anxious then. Uses Xanax prn. Tolerates it. Still rides bike and takes care of his mother and functions well. Tremor otherwise manageable unless upset. Plan: Continue lithium 750 mg daily.  05/17/22 TC :  Pt LVM reporting  following visit about 6 weeks ago, tremors in arm and leg continued after increasing Lithium to 750 mg. Pt decreased Lithium to 600 mg tremors stopped this time and no increase in depression at this time. Pt requesting Rx for 600 mg to [facility] for 90 day.     07/27/22 TC;  Pt stated tremors stopped but he is having increased anxiety on 600 mg of lithium please advise      08/03/22 TC:  Lihtium level 0.7 on 600 mg daily which is about what the level has remained over the last few years.  It is stable.  I don't think we should increase or he is likely to have tremors.  I suggest he use the Xanax for the anxiety he has been noticing.     The rest of his labs were normal (including Cr and Ca) except he needs to follow up with his  doctor about the red blood cells in his urine as his PCP suggested.  This is unrelated to lithium.     10/02/21 appt noted: Tremor went away with reduction in lithium.   Overthinking things and overly sensitive to things.  Very sensitive to the way she speaks to me.  Don't feel anxious.  Wonders if shouldn't have reduced the tremor. Counseling with marriage has helped him communicate but wife unchanged. Happier mood and more talkative if has alcohol but not drinking excessively. Back surgery went well. Xanax can help his rumination.   Still riding bike regularly.   Past psychiatric medication trials include : venlafaxine, Zoloft, mirtazapine,  Viibryd which cause GI pain, Wellbutrin, duloxetine, Celexa, Lexapro, Wellbutrin, Trintellix.  Sertraline, nefazodone,  inVega, risperidone, Abilify, lamotrigine, carbamazepine, Trileptal,  Cerefolin NAC, Deplin,  Cytomel, buspirone,  propranolol   trazodone 25 mg which cause manic symptoms,   Lithium [id number] I am uncertain if he is taking Depakote before  Review of Systems:  Review of Systems  Musculoskeletal:  Positive for arthralgias and back pain.  Neurological:  Positive for tremors and headaches. Negative for weakness.  Psychiatric/Behavioral:  Negative for agitation, behavioral problems, confusion, decreased concentration, dysphoric mood, hallucinations, self-injury, sleep disturbance and suicidal ideas. The patient is not nervous/anxious and is not hyperactive.     Medications: I have reviewed the patient's current medications.  Current Outpatient Medications  Medication Sig Dispense Refill   acetaminophen (TYLENOL) 500 MG tablet Take 1,000 mg by mouth every 6 (six) hours as needed (pain.).     ALPRAZolam (XANAX) 0.25 MG tablet Take 1 tablet (0.25 mg total) by mouth 2 (two) times daily as needed for anxiety. 30 tablet 4   COVID-19 mRNA Vac-TriS, Pfizer, (PFIZER-BIONT COVID-19 VAC-TRIS) SUSP injection Inject into the muscle. 0.3 mL 0    COVID-19 mRNA vaccine 2023-2024 (COMIRNATY) syringe Inject into the muscle. 0.3 mL 0   cyclobenzaprine (FLEXERIL) 5 MG tablet Take 1 tablet (5 mg total) by mouth 3 (three) times daily as needed for muscle spasms. [place]  tablet 0   docusate sodium (COLACE) 100 MG capsule Take 1 capsule (100 mg total) by mouth 2 (two) times daily. 60 capsule 0   gabapentin (NEURONTIN) 100 MG capsule 1 twice daily for 3 days then 2 twice daily for 3 days then 3 twice daily. 90 capsule 0   lithium carbonate 300 MG capsule Take 2 capsules by mouth daily. 180 capsule 0   oxyCODONE-acetaminophen (PERCOCET/ROXICET) 5-325 MG tablet Take 1-2 tablets by mouth every 4 (four) hours as needed for moderate pain. 30 tablet 0   pneumococcal 20-valent conjugate vaccine (PREVNAR 20) 0.5 ML injection Inject into the muscle. 0.5 mL 0   No current facility-administered medications for this visit.    Medication Side Effects: None except mild GI  Allergies: No Known Allergies  Past Medical History:  Diagnosis Date   Arthritis    Depression    Dysrhythmia    aflutter   PAF (paroxysmal atrial fibrillation) (HCC)    Palpitations     Family History  Problem Relation Age of Onset   Atrial fibrillation Father        pacemaker   Heart disease Paternal Grandmother        pacemaker    Social History   Socioeconomic History   Marital status: Married    Spouse name: Not on file   Number of children: 2   Years of education: Not on file   Highest education level: Not on file  Occupational History   Not on file  Tobacco Use   Smoking status: Never   Smokeless tobacco: Never  Vaping Use   Vaping Use: Never used  Substance and Sexual Activity   Alcohol use: Yes    Alcohol/week: 3.0 standard drinks of alcohol    Types: 2 Glasses of wine, 1 Cans of beer per week    Comment: daily   Drug use: Never   Sexual activity: Not on file  Other Topics Concern   Not on file  Social History Narrative   Lives at home with wife.   Retired Economist.    Social Determinants of Health   Financial Resource Strain: Not on file  Food Insecurity: Not on file  Transportation Needs: Not on file  Physical Activity: Not on file  Stress: Not on file  Social Connections: Not on file  Intimate Partner Violence: Not on file    Past Medical History, Surgical history, Social history, and Family history were reviewed and updated as appropriate.   W has no immediate plans to retire.  Please see review of systems for further details on the patient's review from today.   Objective:   Physical Exam:  There were no vitals taken for this visit.  Physical Exam Constitutional:      General: He is not in acute distress. Musculoskeletal:        General: No deformity.  Neurological:     Mental Status: He is alert and oriented to person, place, and time.     Coordination: Coordination normal.     Gait: Gait normal.  Psychiatric:        Attention and Perception: He is attentive.        Mood and Affect: Mood is not anxious or depressed. Affect is not labile, blunt, angry or inappropriate.        Speech: Speech normal.        Behavior: Behavior normal.        Thought Content: Thought content normal. Thought content is not delusional.  Thought content does not include homicidal or suicidal ideation. Thought content does not include suicidal plan.        Cognition and Memory: Cognition normal.        Judgment: Judgment normal.     Comments: Insight intact. No auditory or visual hallucinations.  more irritable.    Lab Review:     Component Value Date/Time   NA 140 03/29/2022 1030   NA 140 07/21/2019 1001   K 3.8 03/29/2022 1030   CL 106 03/29/2022 1030   CO2 24 03/29/2022 1030   GLUCOSE 101 (H) 03/29/2022 1030   BUN 15 03/29/2022 1030   BUN 15 07/21/2019 1001   CREATININE 0.99 03/29/2022 1030   CALCIUM 9.5 03/29/2022 1030   PROT 6.8 09/24/2019 0832   PROT 6.5 07/07/2019 1106   ALBUMIN 4.6 09/24/2019 0832    ALBUMIN 4.6 07/07/2019 1106   AST 20 09/24/2019 0832   ALT 22 09/24/2019 0832   ALKPHOS 43 09/24/2019 0832   BILITOT 1.7 (H) 09/24/2019 0832   BILITOT 1.5 (H) 07/07/2019 1106   GFRNONAA >60 03/29/2022 1030   GFRAA >60 10/01/2019 0323       Component Value Date/Time   WBC 6.2 03/29/2022 1030   RBC 4.47 03/29/2022 1030   HGB 14.0 03/29/2022 1030   HGB 14.7 07/21/2019 1001   HCT 40.5 03/29/2022 1030   HCT 42.0 07/21/2019 1001   PLT 169 03/29/2022 1030   PLT 189 07/21/2019 1001   MCV 90.6 03/29/2022 1030   MCV 90 07/21/2019 1001   MCH 31.3 03/29/2022 1030   MCHC 34.6 03/29/2022 1030   RDW 12.0 03/29/2022 1030   RDW 11.8 07/21/2019 1001   LYMPHSABS 1.5 09/24/2019 0832   LYMPHSABS 1.6 07/07/2019 1106   MONOABS 0.5 09/24/2019 0832   EOSABS 0.4 09/24/2019 0832   EOSABS 0.3 07/07/2019 1106   BASOSABS 0.1 09/24/2019 0832   BASOSABS 0.1 07/07/2019 1106    No results found for: "POCLITH", "LITHIUM"   No results found for: "PHENYTOIN", "PHENOBARB", "VALPROATE", "CBMZ"  Last lithium level May 13, 2018 on 750 mg lithium per day was 0.7  Lithium September 18, 2019= 0.8 Lithium 03/2020 = 0.72   Labs received did not dated April 07, 2019: Normal BMP with creatinine 1.0 calcium 9.6.  Normal CBC and lipid profile.  Normal TSH Vitamin D 36.9 Testosterone 7.6 with normal 6.6-18.1 Lithium 0.8  10/06/20 lithium 0.5 on 450 mg daily  03/08/2021 lithium level 0.8 on 750 mg daily NathanielPerini  Lithium 0.8 Dr. Dutch Gray office again since here on 750 mg daily.  Jan 2023  Lihtium level 0.7 on 600 mg daily   .res Assessment: Plan:   Kiah was seen today for follow-up, anxiety and post-traumatic stress disorder.  Diagnoses and all orders for this visit:  Bipolar affective disorder, rapid cycling (HCC) -     gabapentin (NEURONTIN) 100 MG capsule; 1 twice daily for 3 days then 2 twice daily for 3 days then 3 twice daily.  Generalized anxiety disorder -     gabapentin (NEURONTIN) 100 MG  capsule; 1 twice daily for 3 days then 2 twice daily for 3 days then 3 twice daily.  PTSD (post-traumatic stress disorder) -     gabapentin (NEURONTIN) 100 MG capsule; 1 twice daily for 3 days then 2 twice daily for 3 days then 3 twice daily.  Lithium use  Tinnitus of left ear -     gabapentin (NEURONTIN) 100 MG capsule; 1 twice daily for 3 days  then 2 twice daily for 3 days then 3 twice daily.    Medication sensitive but tolerates lithium well.  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  He is medication sensitive and is not typically stable for very long. He has significant benefit from the lithium for his depression which is very helpful.    He acknowledges the benefit of the lithium overall and that his overall mood has been good for the last several months. Disc mood cycling and he's getting better at recognizing up cycles which are not severe. He tends to stay busy with retirement and cycles and it has helped.   Overall doing well.  He has tried multiple medications and lithium has been the best.  He seeks to perfectly handle his emotions but that is not realistic and that was discussed.  Marital stresses chronic but no worse.  Stress dealing with wife.  Disc ways to deal with it given her refusal to go to counseling. Supportive therapy since dealing with this.   He's startied counseling for this.  And it is helping him.  We discussed how it might potentially impact his wife based on systems therapy over a period of time Disc alcohol which is not a problme for him.  Alternative of gabapentin off label for tinnitus, might also help with irritability and over thinking things.   He wants to pursue   Continue lithium 750 mg daily vs 600 mg daily.  750 mg has helped but caused tremor.  600 mg does not cause tremor.  Lithium level per PCP.  Lithium 0.8 Dr. Perrini's office again since here on 750 mg daily.  Jan 2023  Counseled patient regarding potential benefits,  risks, and side effects of lithium to include potential risk of lithium affecting thyroid and renal function.  Discussed need for periodic lab monitoring to determine drug level and to assess for potential adverse effects.  Counseled patient regarding signs and symptoms of lithium toxicity and advised that they notify office immediately or seek urgent medical attention if experiencing these signs and symptoms.  Patient advised to contact office with any questions or concerns. Disc poss lithium tremor and it's impact.  Consider Rexulti, Vraylar, clonidine  Option propranolol for tremor will retry if needed.  Supportive therapy dealing with wife's accusation.     3 mos  Lynder Parents, MD, DFAPA  07/31/2021 addendum: Pt called in with his last lithium level result. Result obtained 05/10/21 and was 0.9.  Please see After Visit Summary for patient specific instructions.  Future Appointments  Date Time Provider [place]  12/05/2022 10:00 AM GI-315 CT 1 [facility]     No orders of the defined types were placed in this encounter.      -------------------------------

## 2022-10-15 ENCOUNTER — Other Ambulatory Visit: Payer: Self-pay | Admitting: Psychiatry

## 2022-10-15 DIAGNOSIS — F4310 Post-traumatic stress disorder, unspecified: Secondary | ICD-10-CM

## 2022-10-15 DIAGNOSIS — F411 Generalized anxiety disorder: Secondary | ICD-10-CM

## 2022-10-15 DIAGNOSIS — H9312 Tinnitus, left ear: Secondary | ICD-10-CM

## 2022-10-15 DIAGNOSIS — F319 Bipolar disorder, unspecified: Secondary | ICD-10-CM

## 2022-10-26 ENCOUNTER — Telehealth: Payer: Self-pay | Admitting: Psychiatry

## 2022-10-26 NOTE — Telephone Encounter (Signed)
Pt LVM reporting stopped Gabapentin due to side effects (nausea). Requesting to go back on Lithium 750 mg. Having more morning depression and PTSD since he stopped Lithium. Ask for Vivien Rota to [facility] @ [phone]  APT 4/2

## 2022-10-26 NOTE — Telephone Encounter (Signed)
Have him increase lithium back to 750 daily.  If he can tolerate a low dose of gabapentin say 100 mg BID it might help reduce tremor risk.  But if he can't tolerate any gabapentin then stop it.  He's got 100 mg capsules of gabapentin

## 2022-10-26 NOTE — Telephone Encounter (Signed)
Please review.I did try to discuss with pt but he seemed irritable when I asked the questions I needed and hung up.

## 2022-10-26 NOTE — Telephone Encounter (Signed)
Pt informed

## 2022-10-26 NOTE — Telephone Encounter (Signed)
Pt LVM @ 9:33a.  He said he stopped Gabapentin because it was making him nauseous and "sea sick".  He said he want to increase his lithium from 600mg  to 750mg .  He is overthinking and having more "realistic PTSD" and anxious.  He said he know there's a trade off between the medication and having tremors but he's hoping he doesn't have the tremors again.  Next appt 4/2

## 2022-10-26 NOTE — Telephone Encounter (Signed)
I called pt to discuss.He said that he did not stop lithium,he did not state that in the message.I tried to get some clarity and he just told me "I will leave another message" and he hung up.

## 2022-11-02 ENCOUNTER — Other Ambulatory Visit: Payer: Self-pay | Admitting: Psychiatry

## 2022-11-02 DIAGNOSIS — F319 Bipolar disorder, unspecified: Secondary | ICD-10-CM

## 2022-11-04 ENCOUNTER — Other Ambulatory Visit: Payer: Self-pay | Admitting: Psychiatry

## 2022-11-04 DIAGNOSIS — F319 Bipolar disorder, unspecified: Secondary | ICD-10-CM

## 2022-11-06 ENCOUNTER — Telehealth: Payer: Self-pay | Admitting: Psychiatry

## 2022-11-06 ENCOUNTER — Other Ambulatory Visit: Payer: Self-pay

## 2022-11-06 MED ORDER — LITHIUM CARBONATE 150 MG PO CAPS: 150.0000 mg | ORAL_CAPSULE | Freq: Every day | ORAL | 0 refills | Status: DC

## 2022-11-06 NOTE — Telephone Encounter (Addendum)
Rx for 600 mg lithium sent on 2/2. I told patient I would send Rx for 150 mg instead of 450 mg.

## 2022-11-06 NOTE — Telephone Encounter (Signed)
Pt called requesting Rx for Lithium 450 mg to [facility]. Apt 4/2

## 2022-11-06 NOTE — Telephone Encounter (Signed)
Sent 150 mg Rx

## 2022-12-05 ENCOUNTER — Ambulatory Visit
Admission: RE | Admit: 2022-12-05 | Discharge: 2022-12-05 | Disposition: A | Discharge status: Home / Self Care | Payer: Medicare Other | Source: Ambulatory Visit | Attending: Internal Medicine | Admitting: Internal Medicine

## 2022-12-05 DIAGNOSIS — R918 Other nonspecific abnormal finding of lung field: Secondary | ICD-10-CM

## 2022-12-11 ENCOUNTER — Other Ambulatory Visit (HOSPITAL_BASED_OUTPATIENT_CLINIC_OR_DEPARTMENT_OTHER): Payer: Self-pay

## 2022-12-28 ENCOUNTER — Other Ambulatory Visit (HOSPITAL_BASED_OUTPATIENT_CLINIC_OR_DEPARTMENT_OTHER): Payer: Self-pay

## 2023-01-01 ENCOUNTER — Ambulatory Visit: Payer: Medicare Other | Admitting: Psychiatry

## 2023-01-01 DIAGNOSIS — F4310 Post-traumatic stress disorder, unspecified: Secondary | ICD-10-CM | POA: Diagnosis not present

## 2023-01-01 DIAGNOSIS — F319 Bipolar disorder, unspecified: Secondary | ICD-10-CM

## 2023-01-01 DIAGNOSIS — F411 Generalized anxiety disorder: Secondary | ICD-10-CM

## 2023-01-01 DIAGNOSIS — H9312 Tinnitus, left ear: Secondary | ICD-10-CM

## 2023-01-01 DIAGNOSIS — Z79899 Other long term (current) drug therapy: Secondary | ICD-10-CM

## 2023-01-01 MED ORDER — LITHIUM CARBONATE 150 MG PO CAPS: 150.0000 mg | ORAL_CAPSULE | Freq: Every day | ORAL | 3 refills | Status: DC

## 2023-01-01 MED ORDER — ALPRAZOLAM 0.25 MG PO TABS: 0.2500 mg | ORAL_TABLET | Freq: Two times a day (BID) | ORAL | 4 refills | Status: DC | PRN

## 2023-01-01 MED ORDER — LITHIUM CARBONATE 300 MG PO CAPS: 600.0000 mg | ORAL_CAPSULE | Freq: Every day | ORAL | 3 refills | Status: DC

## 2023-01-01 NOTE — Patient Instructions (Signed)
Try gabapentin 100 mg 1-2 capsules twice daily for tinnitus

## 2023-01-01 NOTE — Progress Notes (Signed)
Nathaniel Bush [id number] Feb 09, 1951 72 y.o.  Subjective:   [name] is a 72 y.o. (DOB 15-Jun-1951) male.  Chief Complaint:  Chief Complaint  Patient presents with   Follow-up    HPI   Nathaniel Bush presents to the office today for follow-up of depression and anxiety.    seen in March 16, 2019.  It was relatively stable except for marital stress.  No meds were changed.  09/15/2019 appointment with the following noted: Had Aflutter with successful ablation. Overall mood good without change since here.  Nothing's changed.  Including meds.  Handling stress of socialization and Covid very well.  No unusual issues.. Really slowed down watching the news bc creating stress. Not taking much Xanax.  No problems with the lithium.  No meds were changed.  03/14/2020 appointment with the following noted: Back fusion Christmas.  Doing great with that.  Dr. Arnoldo Morale.  Riding bike in 3-4 weeks. 10 years ago would be down and thinking neg things when first awakens in morning and back that way again.  Stressor wife unhappy with her job and complains of it a lot and wife doesn't like things going on in the family relationships.  These don't help her dealing with him bc she doesn't treat him well.  Her work is getting better and that is helping so he's more hopeful.  Not really look for med changes. Rare Xanax helps him remain calm with wife and tolerates it well.  Plan: no med changes except suggest vit D in winter  04/14/20 TC: Patient called and said that over the last two weeks he has been hearing what people say to him but can't understand the words or what they mean. He said it was like his brain is in a fog. He also said that his right hand is shaking or have tremors. He wants to know is this the cause of the lithium . He just had a physical and all is good there MD response: Reduce lithium from 750 mg to 600   mg daily and see if that helps.  06/14/2020 phone call patient stating  he started feeling bad in the morning after reducing lithium to 600 mg daily and he increase it back to 750 mg.  He called complaining he was still having some depression.  It was suggested he move up his appointment.  09/12/20 appt with following noted: Currently back to 600 mg lithium.  Had gone back up to 750 mg.  Now doesn't think it  was meds making him feel bad and he got better and  Now back to 600 mg lithium for 6 weeks.  Doing well mood wise now.  Same marital issues and handling it as best he can.  Journaling is therapeutic and grounding.  Want to talk about it but she is unapproachable.  Thinking about counseling again.  Wife [name].  Says he shows her his best side everyday.  She refuses to be close to him or touch him.   Rare Xanax. Tremor gone. Plan: Overall not depressed.  No med changes today  01/09/2021 appointment with the following noted: In jan taking lithium 450 and level 0.5. Then started feeling more stressed with wife and decided to increase to  Wife gotten meaner and wants to talk down to hip and resents that.  Xanax helps but not sure lithium helps.  Only used about once weekly. When initially wakes thinks of wife and gets down.  Can't talk with wife bc she  gets angry.  He feels powerless to do anything about it. He feels wife has deep seated unknown resentment against him but refuses to go to counseling with him. Increased lithium to 750 mg about Feb without any changes noticed.   Sleep good.   No excess alcohol. Plan: Continue lithium but need to check labs.  03/13/2021 appt noted: Disc marital situation.   She denied resentment against him.  But then started a litany of resentments for years.  Felt good he was right.  She started to try  be nicer. Journals and it helps. But still feels terrible.  Has done the housework.  She wants to travel and he doesn't want to do it if she doesn't care for him. More impatient with drivers again lately.  I don't know how to take  the edge off that.  Blows horn too much.  Can be patient in other circumstances.  Gets annoyed with stoplights.  Depressed about situation with wife.  Doesn't think he's overall depressed.  Still awakens thinking of marriage.  Says he didn't have a good attitude about taking meds in the past and willing to retry meds. No sex and wife asexual. Not depressed but frustrated with marriage. Patient reports stable mood and denies depressed or irritable moods.  Patient denies any recent difficulty with anxiety.  Patient denies difficulty with sleep initiation or maintenance. Denies appetite disturbance.  Patient reports that energy and motivation have been good.  Patient denies any difficulty with concentration.  Patient denies any suicidal ideation. Plan: Continue lithium 750 mg daily. Start sertraline 25 mg 1/2 daily for 1 week then 1 daily for irritability and depressive sx  03/29/2021 phone call complaining of GI distress from sertraline 25 mg daily.  He is very med sensitive so he was encouraged to try 12.5 mg daily.  07/31/21 appt noted: Sertraline for 2 weeks and stopped DT GI problems. Mood in general good.  Rad on bipolar disorder. Notices there's times he feels happy and does things he regrets the next day.  Not a big issue but at least I'm paying attention and notices.  Not going in as deep into depression.  Not as much letting wife getting to him. taking lithium 750 mg daily. Bummed he just turned 72 yo.  He's still biking. M had MI Seeing therapist Sharyne Richters. No med changes today and he agrees with the exception of adding vitamin D  Continue lithium 750 mg daily.  10/19/21 appt ntoed: Lithium 0.8 Dr. Dutch Gray office Mild intermittent intentional tremor.  Not severe. Real emotional through the holidays, overly, intense. Sentimental with movie.  Also get very mad but not at people but at things when doing projects.  Always polite in public. Therapy, [name],  has helped improve  how he handles [name], coping better with it. Anxiety in waves.  A lot of stress with back pain with injections 1 week ago helped. Only thing that works is 1/2 Xanax prn but still using bottle from April will work in 51 mins. Plan: No med changes today and he agrees with the exception of adding vitamin D  Continue lithium 750 mg daily. Lithium level per PCP. Pt says it was good but would like to get it.  03/26/22 appt noted: Tested positive for Covid 2 weeks ago. No severe sx. Planning back surgery.  No Covid SX. Mood good last week. Therapist hleped a lot with coping with conflict with [name].  Handling it better usually.  She gets into resentment  about things 40 years ago.  She has a list of resentments.  She weaponizes it.  Really pleased with therapy helpful. She never says "I'm sorry". Zella Ball, PhD.  Then he gets upset and tremors then and gets anxious then. Uses Xanax prn. Tolerates it. Still rides bike and takes care of his mother and functions well. Tremor otherwise manageable unless upset. Plan: Continue lithium 750 mg daily.  05/17/22 TC :  Pt LVM reporting  following visit about 6 weeks ago, tremors in arm and leg continued after increasing Lithium to 750 mg. Pt decreased Lithium to 600 mg tremors stopped this time and no increase in depression at this time. Pt requesting Rx for 600 mg to [facility] for 90 day.     07/27/22 TC;  Pt stated tremors stopped but he is having increased anxiety on 600 mg of lithium please advise      08/03/22 TC:  Lihtium level 0.7 on 600 mg daily which is about what the level has remained over the last few years.  It is stable.  I don't think we should increase or he is likely to have tremors.  I suggest he use the Xanax for the anxiety he has been noticing.     The rest of his labs were normal (including Cr and Ca) except he needs to follow up with his doctor about the red blood cells in his urine as his PCP suggested.  This is unrelated to lithium.      10/02/21 appt noted: Tremor went away with reduction in lithium.   Overthinking things and overly sensitive to things.  Very sensitive to the way she speaks to me.  Don't feel anxious.  Wonders if shouldn't have reduced the tremor. Counseling with marriage has helped him communicate but wife unchanged. Happier mood and more talkative if has alcohol but not drinking excessively. Back surgery went well. Xanax can help his rumination. Plan: Alternative of gabapentin off label for tinnitus, might also help with irritability and over thinking things.   He wants to pursue  Continue lithium 750 mg daily vs 600 mg daily.  750 mg has helped but caused tremor.  600 mg does not cause tremor.  10/26/2022 phone call: He stopped gabapentin due to nausea.  Wants to increase lithium to 750 because of PTSD symptoms and "over thinking" and anxiety. Plan: Agreed to increase lithium to 750 mg daily.  Offered option of low-dose gabapentin 100 mg twice daily.  12/31/22 appt noted: Less tremor now than in the past using the IR lithium.   Increased back to lithium 750 mg HS.  Was stressed out with wife and wanted to increase it. Has had good counseling with Darlin Drop.  Others see wife as angry and hot tempered.  She's incapable of being affectionate and attentive to his needs.  Lacking compassion.   Tinnitus under stress is louder.   Handles stress pretty well except strongest anxiety is driving.    Flying use to bother him as bad but better with last trip alone to [place].    Still riding bike regularly.   Past psychiatric medication trials include : venlafaxine, Zoloft, mirtazapine,  Viibryd which cause GI pain, Wellbutrin, duloxetine, Celexa, Lexapro, Wellbutrin, Trintellix.  Sertraline, nefazodone,  inVega, risperidone, Abilify, lamotrigine, carbamazepine, Trileptal,  Cerefolin NAC, Deplin,  Cytomel, buspirone,  propranolol   trazodone 25 mg which cause manic symptoms,   Lithium [id number] I am uncertain  if he is taking Depakote before  Review of Systems:  Review of Systems  Musculoskeletal:  Positive for arthralgias and back pain.  Neurological:  Positive for tremors and headaches.  Psychiatric/Behavioral:  Negative for agitation, behavioral problems, confusion, decreased concentration, dysphoric mood, hallucinations, self-injury, sleep disturbance and suicidal ideas. The patient is not nervous/anxious and is not hyperactive.     Medications: I have reviewed the patient's current medications.  Current Outpatient Medications  Medication Sig Dispense Refill   acetaminophen (TYLENOL) 500 MG tablet Take 1,000 mg by mouth every 6 (six) hours as needed (pain.).     cyclobenzaprine (FLEXERIL) 5 MG tablet Take 1 tablet (5 mg total) by mouth 3 (three) times daily as needed for muscle spasms. 30 tablet 0   docusate sodium (COLACE) 100 MG capsule Take 1 capsule (100 mg total) by mouth 2 (two) times daily. 60 capsule 0   ALPRAZolam (XANAX) 0.25 MG tablet Take 1 tablet (0.25 mg total) by mouth 2 (two) times daily as needed for anxiety. 30 tablet 4   COVID-19 mRNA Vac-TriS, Pfizer, (PFIZER-BIONT COVID-19 VAC-TRIS) SUSP injection Inject into the muscle. (Patient not taking: Reported on 01/01/2023) 0.3 mL 0   COVID-19 mRNA vaccine 2023-2024 (COMIRNATY) syringe Inject into the muscle. (Patient not taking: Reported on 01/01/2023) 0.3 mL 0   gabapentin (NEURONTIN) 100 MG capsule 1 twice daily for 3 days then 2 twice daily for 3 days then 3 twice daily. (Patient not taking: Reported on 01/01/2023) 90 capsule 0   lithium carbonate 150 MG capsule Take 1 capsule (150 mg total) by mouth daily. 90 capsule 3   lithium carbonate 300 MG capsule Take 2 capsules (600 mg total) by mouth daily. 180 capsule 3   No current facility-administered medications for this visit.    Medication Side Effects: None except mild GI  Allergies: No Known Allergies  Past Medical History:  Diagnosis Date   Arthritis    Depression     Dysrhythmia    aflutter   PAF (paroxysmal atrial fibrillation) (HCC)    Palpitations     Family History  Problem Relation Age of Onset   Atrial fibrillation Father        pacemaker   Heart disease Paternal Grandmother        pacemaker    Social History   Socioeconomic History   Marital status: Married    Spouse name: Not on file   Number of children: 2   Years of education: Not on file   Highest education level: Not on file  Occupational History   Not on file  Tobacco Use   Smoking status: Never   Smokeless tobacco: Never  Vaping Use   Vaping Use: Never used  Substance and Sexual Activity   Alcohol use: Yes    Alcohol/week: 3.0 standard drinks of alcohol    Types: 2 Glasses of wine, 1 Cans of beer per week    Comment: daily   Drug use: Never   Sexual activity: Not on file  Other Topics Concern   Not on file  Social History Narrative   Lives at home with wife.  Retired Economist.    Social Determinants of Health   Financial Resource Strain: Not on file  Food Insecurity: Not on file  Transportation Needs: Not on file  Physical Activity: Not on file  Stress: Not on file  Social Connections: Not on file  Intimate Partner Violence: Not on file    Past Medical History, Surgical history, Social history, and Family history were reviewed and updated as appropriate.  W has no immediate plans to retire.  Please see review of systems for further details on the patient's review from today.   Objective:   Physical Exam:  There were no vitals taken for this visit.  Physical Exam Constitutional:      General: He is not in acute distress. Musculoskeletal:        General: No deformity.  Neurological:     Mental Status: He is alert and oriented to person, place, and time.     Coordination: Coordination normal.     Gait: Gait normal.  Psychiatric:        Attention and Perception: He is attentive.        Mood and Affect: Mood is not anxious or depressed.  Affect is not labile, blunt, angry or inappropriate.        Speech: Speech normal.        Behavior: Behavior normal.        Thought Content: Thought content normal. Thought content is not delusional. Thought content does not include homicidal or suicidal ideation. Thought content does not include suicidal plan.        Cognition and Memory: Cognition normal.        Judgment: Judgment normal.     Comments: Insight intact. No auditory or visual hallucinations.  some irritable and anxiety but not severe.    Lab Review:     Component Value Date/Time   NA 140 03/29/2022 1030   NA 140 07/21/2019 1001   K 3.8 03/29/2022 1030   CL 106 03/29/2022 1030   CO2 24 03/29/2022 1030   GLUCOSE 101 (H) 03/29/2022 1030   BUN 15 03/29/2022 1030   BUN 15 07/21/2019 1001   CREATININE 0.99 03/29/2022 1030   CALCIUM 9.5 03/29/2022 1030   PROT 6.8 09/24/2019 0832   PROT 6.5 07/07/2019 1106   ALBUMIN 4.6 09/24/2019 0832   ALBUMIN 4.6 07/07/2019 1106   AST 20 09/24/2019 0832   ALT 22 09/24/2019 0832   ALKPHOS 43 09/24/2019 0832   BILITOT 1.7 (H) 09/24/2019 0832   BILITOT 1.5 (H) 07/07/2019 1106   GFRNONAA >60 03/29/2022 1030   GFRAA >60 10/01/2019 0323       Component Value Date/Time   WBC 6.2 03/29/2022 1030   RBC 4.47 03/29/2022 1030   HGB 14.0 03/29/2022 1030   HGB 14.7 07/21/2019 1001   HCT 40.5 03/29/2022 1030   HCT 42.0 07/21/2019 1001   PLT 169 03/29/2022 1030   PLT 189 07/21/2019 1001   MCV 90.6 03/29/2022 1030   MCV 90 07/21/2019 1001   MCH 31.3 03/29/2022 1030   MCHC 34.6 03/29/2022 1030   RDW 12.0 03/29/2022 1030   RDW 11.8 07/21/2019 1001   LYMPHSABS 1.5 09/24/2019 0832   LYMPHSABS 1.6 07/07/2019 1106   MONOABS 0.5 09/24/2019 0832   EOSABS 0.4 09/24/2019 0832   EOSABS 0.3 07/07/2019 1106   BASOSABS 0.1 09/24/2019 0832   BASOSABS 0.1 07/07/2019 1106    No results found for: "POCLITH", "LITHIUM"   No results found for: "PHENYTOIN", "PHENOBARB", "VALPROATE", "CBMZ"  Last  lithium level May 13, 2018 on 750 mg lithium per day was 0.7  Lithium September 18, 2019= 0.8 Lithium 03/2020 = 0.72   Labs received did not dated April 07, 2019: Normal BMP with creatinine 1.0 calcium 9.6.  Normal CBC and lipid profile.  Normal TSH Vitamin D 36.9 Testosterone 7.6 with normal 6.6-18.1 Lithium 0.8  10/06/20 lithium 0.5 on 450 mg daily  03/08/2021 lithium  level 0.8 on 750 mg daily Dr.Perini  Lithium 0.8 Dr. Perrini's office again since here on 750 mg daily.  Jan 2023  Lihtium level 0.7 on 600 mg daily   Lithium 0.8 Dr. Perrini's office again since here on 750 mg daily.  Jan 2023 Lithium 0.7 Dr. Dutch Gray office again since here on 750 mg daily.  Nov 2023  .res Assessment: Plan:   [place] was seen today for follow-up.  Diagnoses and all orders for this visit:  Bipolar affective disorder, rapid cycling -     lithium carbonate 300 MG capsule; Take 2 capsules (600 mg total) by mouth daily. -     lithium carbonate 150 MG capsule; Take 1 capsule (150 mg total) by mouth daily.  Generalized anxiety disorder -     ALPRAZolam (XANAX) 0.25 MG tablet; Take 1 tablet (0.25 mg total) by mouth 2 (two) times daily as needed for anxiety. -     lithium carbonate 300 MG capsule; Take 2 capsules (600 mg total) by mouth daily. -     lithium carbonate 150 MG capsule; Take 1 capsule (150 mg total) by mouth daily.  PTSD (post-traumatic stress disorder) -     lithium carbonate 300 MG capsule; Take 2 capsules (600 mg total) by mouth daily. -     lithium carbonate 150 MG capsule; Take 1 capsule (150 mg total) by mouth daily.  Lithium use  Tinnitus of left ear    Medication sensitive but tolerates lithium well.  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  He is medication sensitive and is not typically stable for very long. He has significant benefit from the lithium for his depression which is very helpful.    He acknowledges the benefit of the lithium overall and that  his overall mood has been good for the last several months. Disc mood cycling and he's getting better at recognizing up cycles which are not severe. He tends to stay busy with retirement and cycles and it has helped.   Overall doing well.  He has tried multiple medications and lithium has been the best.  He seeks to perfectly handle his emotions but that is not realistic and that was discussed.  Marital stresses chronic but no worse.  Stress dealing with wife.  Disc ways to deal with it given her refusal to go to counseling. Supportive therapy since dealing with this.   He's startied counseling for this.  And it is helping him.  We discussed how it might potentially impact his wife based on systems therapy over a period of time Disc alcohol which is not a problme for him.  Alternative of gabapentin off label for tinnitus, might also help with irritability and over thinking things.   He wants to try again at lower dose 100-200 mg BID.    Continue lithium 750 mg daily.  750 mg has helped but caused tremor.  600 mg does not cause tremor.  Lithium level per PCP.  Lithium 0.8 Dr. Perrini's office again since here on 750 mg daily.  Nov 2023  Counseled patient regarding potential benefits, risks, and side effects of lithium to include potential risk of lithium affecting thyroid and renal function.  Discussed need for periodic lab monitoring to determine drug level and to assess for potential adverse effects.  Counseled patient regarding signs and symptoms of lithium toxicity and advised that they notify office immediately or seek urgent medical attention if experiencing these signs and symptoms.  Patient advised to  contact office with any questions or concerns. Disc poss lithium tremor and it's impact.  Consider Rexulti, Vraylar, clonidine  Xanax 0.25 mg prn rarely. We discussed the short-term risks associated with benzodiazepines including sedation and increased fall risk among others.  Discussed  long-term side effect risk including dependence, potential withdrawal symptoms, and the potential eventual dose-related risk of dementia.  But recent studies from 2020 dispute this association between benzodiazepines and dementia risk. Newer studies in 2020 do not support an association with dementia.  Option propranolol for tremor will retry if needed.  Supportive therapy dealing with wife's accusation.   Therapy is helping.    3 mos  Lynder Parents, MD, DFAPA  07/31/2021 addendum: Pt called in with his last lithium level result. Result obtained 05/10/21 and was 0.9.  Please see After Visit Summary for patient specific instructions.  No future appointments.    No orders of the defined types were placed in this encounter.      -------------------------------

## 2023-01-07 ENCOUNTER — Other Ambulatory Visit (HOSPITAL_BASED_OUTPATIENT_CLINIC_OR_DEPARTMENT_OTHER): Payer: Self-pay

## 2023-01-07 MED ORDER — COMIRNATY 30 MCG/0.3ML IM SUSY
0.3000 mL | PREFILLED_SYRINGE | INTRAMUSCULAR | 0 refills | Status: DC
  Filled 2023-01-07: qty 0.3, 1d supply, fill #0

## 2023-02-14 ENCOUNTER — Other Ambulatory Visit: Payer: Self-pay | Admitting: Neurosurgery

## 2023-02-14 DIAGNOSIS — G8929 Other chronic pain: Secondary | ICD-10-CM

## 2023-03-20 ENCOUNTER — Ambulatory Visit
Admission: RE | Admit: 2023-03-20 | Discharge: 2023-03-20 | Disposition: A | Discharge status: Home / Self Care | Payer: Medicare Other | Source: Ambulatory Visit | Attending: Neurosurgery | Admitting: Neurosurgery

## 2023-03-20 DIAGNOSIS — G8929 Other chronic pain: Secondary | ICD-10-CM

## 2023-05-20 ENCOUNTER — Encounter: Payer: Self-pay | Admitting: Psychiatry

## 2023-06-17 ENCOUNTER — Other Ambulatory Visit (HOSPITAL_BASED_OUTPATIENT_CLINIC_OR_DEPARTMENT_OTHER): Payer: Self-pay

## 2023-06-17 MED ORDER — INFLUENZA VAC A&B SURF ANT ADJ 0.5 ML IM SUSY
0.5000 mL | PREFILLED_SYRINGE | Freq: Once | INTRAMUSCULAR | 0 refills | Status: AC
  Filled 2023-06-17: qty 0.5, 1d supply, fill #0

## 2023-06-17 MED ORDER — COVID-19 MRNA VAC-TRIS(PFIZER) 30 MCG/0.3ML IM SUSY
0.3000 mL | PREFILLED_SYRINGE | Freq: Once | INTRAMUSCULAR | 0 refills | Status: AC
  Filled 2023-06-17: qty 0.3, 1d supply, fill #0

## 2023-07-03 ENCOUNTER — Ambulatory Visit: Payer: Medicare Other | Admitting: Psychiatry

## 2023-07-03 ENCOUNTER — Encounter: Payer: Self-pay | Admitting: Psychiatry

## 2023-07-03 DIAGNOSIS — F4310 Post-traumatic stress disorder, unspecified: Secondary | ICD-10-CM

## 2023-07-03 DIAGNOSIS — Z79899 Other long term (current) drug therapy: Secondary | ICD-10-CM | POA: Diagnosis not present

## 2023-07-03 DIAGNOSIS — F411 Generalized anxiety disorder: Secondary | ICD-10-CM | POA: Diagnosis not present

## 2023-07-03 DIAGNOSIS — F319 Bipolar disorder, unspecified: Secondary | ICD-10-CM | POA: Diagnosis not present

## 2023-07-03 DIAGNOSIS — H9312 Tinnitus, left ear: Secondary | ICD-10-CM

## 2023-07-03 MED ORDER — ALPRAZOLAM 0.25 MG PO TABS: 0.2500 mg | ORAL_TABLET | Freq: Two times a day (BID) | ORAL | 4 refills | Status: DC | PRN

## 2023-07-03 NOTE — Progress Notes (Signed)
Nathaniel Bush [id number] 07/25/1951 72 y.o.  Subjective:   [name] is a 72 y.o. (DOB [birth year]) male.  Chief Complaint:  Chief Complaint  Patient presents with   Follow-up   Depression    HPI   Nathaniel Bush presents to the office today for follow-up of depression and anxiety.    seen in March 16, 2019.  It was relatively stable except for marital stress.  No meds were changed.  09/15/2019 appointment with the following noted: Had Aflutter with successful ablation. Overall mood good without change since here.  Nothing's changed.  Including meds.  Handling stress of socialization and Covid very well.  No unusual issues.. Really slowed down watching the news bc creating stress. Not taking much Xanax.  No problems with the lithium.  No meds were changed.  03/14/2020 appointment with the following noted: Back fusion Christmas.  Doing great with that.  Dr. Lovell Sheehan.  Riding bike in 3-4 weeks. 10 years ago would be down and thinking neg things when first awakens in morning and back that way again.  Stressor wife unhappy with her job and complains of it a lot and wife doesn't like things going on in the family relationships.  These don't help her dealing with him bc she doesn't treat him well.  Her work is getting better and that is helping so he's more hopeful.  Not really look for med changes. Rare Xanax helps him remain calm with wife and tolerates it well.  Plan: no med changes except suggest vit D in winter  04/14/20 TC: Patient called and said that over the last two weeks he has been hearing what people say to him but can't understand the words or what they mean. He said it was like his brain is in a fog. He also said that his right hand is shaking or have tremors. He wants to know is this the cause of the lithium . He just had a physical and all is good there MD response: Reduce lithium from 750 mg to 600   mg daily and see if that helps.  06/14/2020 phone call  patient stating he started feeling bad in the morning after reducing lithium to 600 mg daily and he increase it back to 750 mg.  He called complaining he was still having some depression.  It was suggested he move up his appointment.  09/12/20 appt with following noted: Currently back to 600 mg lithium.  Had gone back up to 750 mg.  Now doesn't think it  was meds making him feel bad and he got better and  Now back to 600 mg lithium for 6 weeks.  Doing well mood wise now.  Same marital issues and handling it as best he can.  Journaling is therapeutic and grounding.  Want to talk about it but she is unapproachable.  Thinking about counseling again.  [name].  Says he shows her his best side everyday.  She refuses to be close to him or touch him.   Rare Xanax. Tremor gone. Plan: Overall not depressed.  No med changes today  01/09/2021 appointment with the following noted: In jan taking lithium 450 and level 0.5. Then started feeling more stressed with wife and decided to increase to  Wife gotten meaner and wants to talk down to hip and resents that.  Xanax helps but not sure lithium helps.  Only used about once weekly. When initially wakes thinks of wife and gets down.  Can't talk with  wife bc she gets angry.  He feels powerless to do anything about it. He feels wife has deep seated unknown resentment against him but refuses to go to counseling with him. Increased lithium to 750 mg about Feb without any changes noticed.   Sleep good.   No excess alcohol. Plan: Continue lithium but need to check labs.  03/13/2021 appt noted: Disc marital situation.   She denied resentment against him.  But then started a litany of resentments for years.  Felt good he was right.  She started to try  be nicer. Journals and it helps. But still feels terrible.  Has done the housework.  She wants to travel and he doesn't want to do it if she doesn't care for him. More impatient with drivers again lately.  I don't  know how to take the edge off that.  Blows horn too much.  Can be patient in other circumstances.  Gets annoyed with stoplights.  Depressed about situation with wife.  Doesn't think he's overall depressed.  Still awakens thinking of marriage.  Says he didn't have a good attitude about taking meds in the past and willing to retry meds. No sex and wife asexual. Not depressed but frustrated with marriage. Patient reports stable mood and denies depressed or irritable moods.  Patient denies any recent difficulty with anxiety.  Patient denies difficulty with sleep initiation or maintenance. Denies appetite disturbance.  Patient reports that energy and motivation have been good.  Patient denies any difficulty with concentration.  Patient denies any suicidal ideation. Plan: Continue lithium 750 mg daily. Start sertraline 25 mg 1/2 daily for 1 week then 1 daily for irritability and depressive sx  03/29/2021 phone call complaining of GI distress from sertraline 25 mg daily.  He is very med sensitive so he was encouraged to try 12.5 mg daily.  07/31/21 appt noted: Sertraline for 2 weeks and stopped DT GI problems. Mood in general good.  Rad on bipolar disorder. Notices there's times he feels happy and does things he regrets the next day.  Not a big issue but at least I'm paying attention and notices.  Not going in as deep into depression.  Not as much letting wife getting to him. taking lithium 750 mg daily. Bummed he just turned 72 yo.  He's still biking. M had MI Seeing therapist Nathaniel Bush. No med changes today and he agrees with the exception of adding vitamin D  Continue lithium 750 mg daily.  10/19/21 appt ntoed: Lithium 0.8 Dr. Pierre Bali office Mild intermittent intentional tremor.  Not severe. Real emotional through the holidays, overly, intense. Sentimental with movie.  Also get very mad but not at people but at things when doing projects.  Always polite in public. Therapy, Nathaniel Bush,  has  helped improve how he handles Nathaniel Bush, coping better with it. Anxiety in waves.  A lot of stress with back pain with injections 1 week ago helped. Only thing that works is 1/2 Xanax prn but still using bottle from April will work in 15 mins. Plan: No med changes today and he agrees with the exception of adding vitamin D  Continue lithium 750 mg daily. Lithium level per PCP. Pt says it was good but would like to get it.  03/26/22 appt noted: Tested positive for Covid 2 weeks ago. No severe sx. Planning back surgery.  No Covid SX. Mood good last week. Therapist hleped a lot with coping with conflict with Nathaniel Bush.  Handling it better usually.  She  gets into resentment about things 40 years ago.  She has a list of resentments.  She weaponizes it.  Really pleased with therapy helpful. She never says "I'm sorry". Nathaniel Geralds, PhD.  Then he gets upset and tremors then and gets anxious then. Uses Xanax prn. Tolerates it. Still rides bike and takes care of his mother and functions well. Tremor otherwise manageable unless upset. Plan: Continue lithium 750 mg daily.  05/17/22 TC :  Pt LVM reporting  following visit about 6 weeks ago, tremors in arm and leg continued after increasing Lithium to 750 mg. Pt decreased Lithium to 600 mg tremors stopped this time and no increase in depression at this time. Pt requesting Rx for 600 mg to [facility] for 90 day.     07/27/22 TC;  Pt stated tremors stopped but he is having increased anxiety on 600 mg of lithium please advise      08/03/22 TC:  Lihtium level 0.7 on 600 mg daily which is about what the level has remained over the last few years.  It is stable.  I don't think we should increase or he is likely to have tremors.  I suggest he use the Xanax for the anxiety he has been noticing.     The rest of his labs were normal (including Cr and Ca) except he needs to follow up with his doctor about the red blood cells in his urine as his PCP suggested.  This is  unrelated to lithium.     10/02/21 appt noted: Tremor went away with reduction in lithium.   Overthinking things and overly sensitive to things.  Very sensitive to the way she speaks to me.  Don't feel anxious.  Wonders if shouldn't have reduced the tremor. Counseling with marriage has helped him communicate but wife unchanged. Happier mood and more talkative if has alcohol but not drinking excessively. Back surgery went well. Xanax can help his rumination. Plan: Alternative of gabapentin off label for tinnitus, might also help with irritability and over thinking things.   He wants to pursue  Continue lithium 750 mg daily vs 600 mg daily.  750 mg has helped but caused tremor.  600 mg does not cause tremor.  10/26/2022 phone call: He stopped gabapentin due to nausea.  Wants to increase lithium to 750 because of PTSD symptoms and "over thinking" and anxiety. Plan: Agreed to increase lithium to 750 mg daily.  Offered option of low-dose gabapentin 100 mg twice daily.  12/31/22 appt noted: Less tremor now than in the past using the IR lithium.   Increased back to lithium 750 mg HS.  Was stressed out with wife and wanted to increase it. Has had good counseling with Nathaniel Bush.  Others see wife as angry and hot tempered.  She's incapable of being affectionate and attentive to his needs.  Lacking compassion.   Tinnitus under stress is louder.   Handles stress pretty well except strongest anxiety is driving.    Flying use to bother him as bad but better with last trip alone to [place].   Plan: Alternative of gabapentin off label for tinnitus, might also help with irritability and over thinking things.   He wants to try again at lower dose 100-200 mg BID.   Continue lithium 750 mg daily.  750 mg has helped but caused tremor.  600 mg does not cause tremor.  07/03/23 appt noted: Meds:  lithium 750, Xanax 0.125 about 1/3 of days. Wife stressing him lately.  Changed  person.  She's a lot more negative than in  the past.  She's not sleeping much.   She is very involved with her sister who bought a house in [place].  Gets upset with her $ decisions which stress him. Benefit counseling and meds.  Letting things slide off his back.  Overall doing remarkably well.  Has CBT skills. Counseling 50 sessions. Battles some irritability but handling it well. Constant tinnitus.   Still riding bike regularly.   Past psychiatric medication trials include : venlafaxine, Zoloft, mirtazapine,  Viibryd which cause GI pain, Wellbutrin, duloxetine, Celexa, Lexapro, Wellbutrin, Trintellix.  Sertraline, nefazodone,  inVega, risperidone, Abilify, lamotrigine, carbamazepine, Trileptal, gabapentin  Cerefolin NAC, Deplin,  Cytomel,s buspirone,  propranolol   trazodone 25 mg which cause manic symptoms,   Lithium 600-750 I am uncertain if he is taking Depakote before  Review of Systems:  Review of Systems  Cardiovascular:  Negative for chest pain.  Musculoskeletal:  Positive for arthralgias and back pain.  Neurological:  Positive for tremors and headaches.  Psychiatric/Behavioral:  Negative for agitation, behavioral problems, confusion, decreased concentration, dysphoric mood, hallucinations, self-injury, sleep disturbance and suicidal ideas. The patient is not nervous/anxious and is not hyperactive.     Medications: I have reviewed the patient's current medications.  Current Outpatient Medications  Medication Sig Dispense Refill   acetaminophen (TYLENOL) 500 MG tablet Take 1,000 mg by mouth every 6 (six) hours as needed (pain.).     COVID-19 mRNA vaccine 2023-2024 (COMIRNATY) syringe Inject 0.3 mLs into the muscle. 0.3 mL 0   cyclobenzaprine (FLEXERIL) 5 MG tablet Take 1 tablet (5 mg total) by mouth 3 (three) times daily as needed for muscle spasms. 30 tablet 0   docusate sodium (COLACE) 100 MG capsule Take 1 capsule (100 mg total) by mouth 2 (two) times daily. 60 capsule 0   lithium carbonate 150 MG capsule Take 1  capsule (150 mg total) by mouth daily. 90 capsule 3   lithium carbonate 300 MG capsule Take 2 capsules (600 mg total) by mouth daily. 180 capsule 3   ALPRAZolam (XANAX) 0.25 MG tablet Take 1 tablet (0.25 mg total) by mouth 2 (two) times daily as needed for anxiety. 30 tablet 4   COVID-19 mRNA vaccine 2023-2024 (COMIRNATY) syringe Inject into the muscle. (Patient not taking: Reported on 01/01/2023) 0.3 mL 0   gabapentin (NEURONTIN) 100 MG capsule 1 twice daily for 3 days then 2 twice daily for 3 days then 3 twice daily. (Patient not taking: Reported on 07/03/2023) 90 capsule 0   No current facility-administered medications for this visit.    Medication Side Effects: None except mild GI  Allergies: No Known Allergies  Past Medical History:  Diagnosis Date   Arthritis    Depression    Dysrhythmia    aflutter   PAF (paroxysmal atrial fibrillation) (HCC)    Palpitations     Family History  Problem Relation Age of Onset   Atrial fibrillation Father        pacemaker   Heart disease Paternal Grandmother        pacemaker    Social History   Socioeconomic History   Marital status: Married    Spouse name: Not on file   Number of children: 2   Years of education: Not on file   Highest education level: Not on file  Occupational History   Not on file  Tobacco Use   Smoking status: Never   Smokeless tobacco: Never  Vaping Use  Vaping status: Never Used  Substance and Sexual Activity   Alcohol use: Yes    Alcohol/week: 3.0 standard drinks of alcohol    Types: 2 Glasses of wine, 1 Cans of beer per week    Comment: daily   Drug use: Never   Sexual activity: Not on file  Other Topics Concern   Not on file  Social History Narrative   Lives at home with wife.  Retired Futures trader.    Social Determinants of Health   Financial Resource Strain: Not on file  Food Insecurity: Not on file  Transportation Needs: Not on file  Physical Activity: Not on file  Stress: Not on file   Social Connections: Not on file  Intimate Partner Violence: Not on file    Past Medical History, Surgical history, Social history, and Family history were reviewed and updated as appropriate.   W has no immediate plans to retire.  Please see review of systems for further details on the patient's review from today.   Objective:   Physical Exam:  There were no vitals taken for this visit.  Physical Exam Constitutional:      General: He is not in acute distress. Musculoskeletal:        General: No deformity.  Neurological:     Mental Status: He is alert and oriented to person, place, and time.     Coordination: Coordination normal.     Gait: Gait normal.  Psychiatric:        Attention and Perception: He is attentive.        Mood and Affect: Mood is not anxious or depressed. Affect is not labile, blunt, angry or inappropriate.        Speech: Speech normal.        Behavior: Behavior normal.        Thought Content: Thought content normal. Thought content is not delusional. Thought content does not include homicidal or suicidal ideation. Thought content does not include suicidal plan.        Cognition and Memory: Cognition normal.        Judgment: Judgment normal.     Comments: Insight intact. No auditory or visual hallucinations.  Better irritable intentionally with effort.  and anxiety     Lab Review:     Component Value Date/Time   NA 140 03/29/2022 1030   NA 140 07/21/2019 1001   K 3.8 03/29/2022 1030   CL 106 03/29/2022 1030   CO2 24 03/29/2022 1030   GLUCOSE 101 (H) 03/29/2022 1030   BUN 15 03/29/2022 1030   BUN 15 07/21/2019 1001   CREATININE 0.99 03/29/2022 1030   CALCIUM 9.5 03/29/2022 1030   PROT 6.8 09/24/2019 0832   PROT 6.5 07/07/2019 1106   ALBUMIN 4.6 09/24/2019 0832   ALBUMIN 4.6 07/07/2019 1106   AST 20 09/24/2019 0832   ALT 22 09/24/2019 0832   ALKPHOS 43 09/24/2019 0832   BILITOT 1.7 (H) 09/24/2019 0832   BILITOT 1.5 (H) 07/07/2019 1106    GFRNONAA >60 03/29/2022 1030   GFRAA >60 10/01/2019 0323       Component Value Date/Time   WBC 6.2 03/29/2022 1030   RBC 4.47 03/29/2022 1030   HGB 14.0 03/29/2022 1030   HGB 14.7 07/21/2019 1001   HCT 40.5 03/29/2022 1030   HCT 42.0 07/21/2019 1001   PLT 169 03/29/2022 1030   PLT 189 07/21/2019 1001   MCV 90.6 03/29/2022 1030   MCV 90 07/21/2019 1001   MCH 31.3 03/29/2022  1030   MCHC 34.6 03/29/2022 1030   RDW 12.0 03/29/2022 1030   RDW 11.8 07/21/2019 1001   LYMPHSABS 1.5 09/24/2019 0832   LYMPHSABS 1.6 07/07/2019 1106   MONOABS 0.5 09/24/2019 0832   EOSABS 0.4 09/24/2019 0832   EOSABS 0.3 07/07/2019 1106   BASOSABS 0.1 09/24/2019 0832   BASOSABS 0.1 07/07/2019 1106    No results found for: "POCLITH", "LITHIUM"   No results found for: "PHENYTOIN", "PHENOBARB", "VALPROATE", "CBMZ"  Last lithium level May 13, 2018 on 750 mg lithium per day was 0.7  Lithium September 18, 2019= 0.8 Lithium 03/2020 = 0.72   Labs received did not dated April 07, 2019: Normal BMP with creatinine 1.0 calcium 9.6.  Normal CBC and lipid profile.  Normal TSH Vitamin D 36.9 Testosterone 7.6 with normal 6.6-18.1 Lithium 0.8  10/06/20 lithium 0.5 on 450 mg daily  03/08/2021 lithium level 0.8 on 750 mg daily Dr.Perini  Lithium 0.8 Dr. Pierre Bali office again since here on 750 mg daily.  Jan 2023  Lihtium level 0.7 on 600 mg daily   Lithium 0.8 Dr. Perrini's office again since here on 750 mg daily.  Jan 2023 Lithium 0.7 Dr. Pierre Bali office again since here on 750 mg daily.  Nov 2023 Lithium 0.6 on 05/13/23 on 750 mg  .res Assessment: Plan:   Julia was seen today for follow-up and depression.  Diagnoses and all orders for this visit:  Bipolar affective disorder, rapid cycling (HCC)  Generalized anxiety disorder -     ALPRAZolam (XANAX) 0.25 MG tablet; Take 1 tablet (0.25 mg total) by mouth 2 (two) times daily as needed for anxiety.  PTSD (post-traumatic stress disorder)  Lithium  use  Tinnitus of left ear    Medication sensitive but tolerates lithium well.  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  He is medication sensitive and is not typically stable for very long. He has significant benefit from the lithium for his depression which is very helpful.    He acknowledges the benefit of the lithium overall and that his overall mood has been good for the last several months. Disc mood cycling and he's getting better at recognizing up cycles which are not severe. He tends to stay busy with retirement and cycles and it has helped.   Overall doing well.  He has tried multiple medications and lithium has been the best.  He seeks to perfectly handle his emotions but that is not realistic and that was discussed.  Marital stresses chronic but no worse.  Stress dealing with wife.  Supportive therapy since dealing with this.   His counseling helped.    Continue lithium 750 mg daily.  750 mg has helped but caused tremor.  600 mg does not cause tremor.  Lithium level per PCP.  Lithium 0.8 Dr. Perrini's office again since here on 750 mg daily.  Nov 2023  He might retry gabapentin for tinnitus.  Counseled patient regarding potential benefits, risks, and side effects of lithium to include potential risk of lithium affecting thyroid and renal function.  Discussed need for periodic lab monitoring to determine drug level and to assess for potential adverse effects.  Counseled patient regarding signs and symptoms of lithium toxicity and advised that they notify office immediately or seek urgent medical attention if experiencing these signs and symptoms.  Patient advised to contact office with any questions or concerns. Disc poss lithium tremor and it's impact.  Consider Rexulti, Vraylar, clonidine  Xanax 0.25 mg prn  rarely. We discussed the short-term risks associated with benzodiazepines including sedation and increased fall risk among others.  Discussed  long-term side effect risk including dependence, potential withdrawal symptoms, and the potential eventual dose-related risk of dementia.  But recent studies from 2020 dispute this association between benzodiazepines and dementia risk. Newer studies in 2020 do not support an association with dementia.  Option propranolol for tremor will retry if needed.  Supportive therapy dealing with wife's accusation.   Therapy is helping.    6 mos  Meredith Staggers, MD, DFAPA    Please see After Visit Summary for patient specific instructions.  Future Appointments  Date Time Provider Department Center  09/11/2023 11:00 AM Penumalli, Glenford Bayley, MD [facility] None      No orders of the defined types were placed in this encounter.      -------------------------------

## 2023-08-20 ENCOUNTER — Encounter: Payer: Self-pay | Admitting: Cardiology

## 2023-08-22 NOTE — Progress Notes (Signed)
  Cardiology Office Note:   Date:  08/23/2023  ID:  Nathaniel Bush, DOB [birth year], MRN [id number] PCP: Rodrigo Ran, MD  [facility] Providers Cardiologist:  Rollene Rotunda, MD {  History of Present Illness:   Nathaniel Bush is a 72 y.o. male who follow up of atrial fib.   He is status post atrial flutter ablation.  He had a coronary calcium score which was 89.4 which put him at the 48th percentile.    Since I last saw him he has done well.  He rides his bike.  He rides a stationary bicycle.  He denies any new cardiovascular symptoms.  He denies any chest pressure, neck or arm discomfort.  He is had no new palpitations, presyncope or syncope.  He has had no weight gain or edema.  ROS: As stated in the HPI and negative for all other systems.  Studies Reviewed:    EKG:   EKG Interpretation Date/Time:  Friday August 23 2023 10:23:42 EST Ventricular Rate:  56 PR Interval:  260 QRS Duration:  106 QT Interval:  436 QTC Calculation: 420 R Axis:   -36  Text Interpretation: Sinus rhythm with 1st degree A-V block with Blocked Premature atrial complexes with Premature supraventricular complexes and Premature ventricular complexes or Fusion complexes Left axis deviation Poor anterior R wave progression When compared with ECG of 29-Mar-2022 10:12, Fusion complexes are now Present Premature ventricular complexes are now Present Nonspecific T wave abnormality now evident in Lateral leads Confirmed by Rollene Rotunda ([id number]) on 08/23/2023 10:36:06 AM     Risk Assessment/Calculations:              Physical Exam:   VS:  BP 138/60 (BP Location: Left Arm, Patient Position: Sitting, Cuff Size: Normal)   Pulse (!) 55   Ht 5\' 10"  (1.778 m)   Wt 173 lb (78.5 kg)   SpO2 98%   BMI 24.82 kg/m    Wt Readings from Last 3 Encounters:  08/23/23 173 lb (78.5 kg)  04/24/22 168 lb (76.2 kg)  04/05/22 167 lb (75.8 kg)     GEN: Well nourished, well developed in no acute distress NECK: No JVD;  No carotid bruits CARDIAC: RRR, no murmurs, rubs, gallops RESPIRATORY:  Clear to auscultation without rales, wheezing or rhonchi  ABDOMEN: Soft, non-tender, non-distended EXTREMITIES:  No edema; No deformity   ASSESSMENT AND PLAN:   ATRIAL FLUTTER:    He is not had any symptomatic paroxysms.  He has rare ectopy.  No change in therapy.  ELEVATED CORONARY CALCIUM:   We are talking about primary risk reduction.  He was put on Crestor but he really did not tolerate this.  He has not been switched to Lipitor which he has not started yet.  He wanted to talk about this.  I gave him an LDL goal in the 50s but I do not know the most recent labs.  He does his level is not a bad and he will start the Lipitor and follow-up with Rodrigo Ran, MD   DYSLIPIDEMIA:    As above.  BRADYCARDIA:    He has had no symptomatic bradycardia.  No change in therapy.     Follow up with me in one year.   Signed, Rollene Rotunda, MD

## 2023-08-23 ENCOUNTER — Ambulatory Visit: Payer: Medicare Other | Attending: Cardiology | Admitting: Cardiology

## 2023-08-23 ENCOUNTER — Encounter: Payer: Self-pay | Admitting: Cardiology

## 2023-08-23 VITALS — BP 138/60 | HR 55 | Ht 70.0 in | Wt 173.0 lb

## 2023-08-23 DIAGNOSIS — I4892 Unspecified atrial flutter: Secondary | ICD-10-CM

## 2023-08-23 DIAGNOSIS — R931 Abnormal findings on diagnostic imaging of heart and coronary circulation: Secondary | ICD-10-CM

## 2023-08-23 DIAGNOSIS — R001 Bradycardia, unspecified: Secondary | ICD-10-CM

## 2023-08-23 DIAGNOSIS — E785 Hyperlipidemia, unspecified: Secondary | ICD-10-CM | POA: Diagnosis not present

## 2023-08-23 NOTE — Patient Instructions (Signed)
Medication Instructions:  Your physician recommends that you continue on your current medications as directed. Please refer to the Current Medication list given to you today.  *If you need a refill on your cardiac medications before your next appointment, please call your pharmacy*  Follow-Up: At [facility], you and your health needs are our priority.  As part of our continuing mission to provide you with exceptional heart care, we have created designated Provider Care Teams.  These Care Teams include your primary Cardiologist (physician) and Advanced Practice Providers (APPs -  Physician Assistants and Nurse Practitioners) who all work together to provide you with the care you need, when you need it.  Your next appointment:   12 month(s)  Provider:   Rollene Rotunda, MD

## 2023-09-11 ENCOUNTER — Ambulatory Visit: Payer: Medicare Other | Admitting: Diagnostic Neuroimaging

## 2023-09-11 ENCOUNTER — Encounter: Payer: Self-pay | Admitting: Diagnostic Neuroimaging

## 2023-09-11 VITALS — BP 138/83 | HR 61 | Ht 69.0 in | Wt 172.8 lb

## 2023-09-11 DIAGNOSIS — R413 Other amnesia: Secondary | ICD-10-CM

## 2023-09-11 NOTE — Patient Instructions (Signed)
  MILD MEMORY IMPAIRMENT (could be related to hearing impairment; no change in ADLs; only occurs with interactions with wife; minor elevation of pTau217 0.20H [0-0.18]) - check MRI brain and neuropsychology testing - obtain collateral history through patient's spouse, friends, children - try to stay active physically and get some exercise (at least 15-30 minutes per day) - eat a nutritious diet with lean protein, plants / vegetables, whole grains; avoid ultra-processed foods - increase social activities, brain stimulation, games, puzzles, hobbies, crafts, arts, music; try new activities; keep it fun! - aim for at least 7-8 hours sleep per night (or more) - avoid smoking and alcohol

## 2023-09-11 NOTE — Progress Notes (Signed)
[facility]  PATIENT: Nathaniel Bush DOB: November 15, 1950  REFERRING CLINICIAN: Rodrigo Ran, MD HISTORY FROM: patient  REASON FOR VISIT: new consult   HISTORICAL  CHIEF COMPLAINT:  Chief Complaint  Patient presents with   Room 6    Pt is here Alone. Pt states that he has auditory hiss and music playing in her ears. Pt states that sometimes he will go into a room and forget why he came in there.     HISTORY OF PRESENT ILLNESS:   72 year old male here for evaluation of memory loss.  Patient has longer standing issues of bipolar disorder, PTSD, hearing loss and tinnitus.  The last couple of years he has been having marital strain related to his wife claiming that he is forgetting things that she has told him about.  Patient feels that this is being done in an unpleasant manner, and this further frustrates him and strains a relationship.  He has been working with psychiatry.  He is also seeing therapist and counselor.  Patient has not noted any major memory loss symptoms himself in any other situation outside of the home or with other people.  He is able to maintain his ADLs.  He went to PCP and had phosphorylated tau blood testing which was slightly elevated.  Patient referred here for further evaluation.   REVIEW OF SYSTEMS: Full 14 system review of systems performed and negative with exception of: as per HPI.  ALLERGIES: No Known Allergies  HOME MEDICATIONS: Outpatient Medications Prior to Visit  Medication Sig Dispense Refill   acetaminophen (TYLENOL) 500 MG tablet Take 1,000 mg by mouth every 6 (six) hours as needed (pain.).     ALPRAZolam (XANAX) 0.25 MG tablet Take 1 tablet (0.25 mg total) by mouth 2 (two) times daily as needed for anxiety. 30 tablet 4   lithium carbonate 150 MG capsule Take 1 capsule (150 mg total) by mouth daily. 90 capsule 3   lithium carbonate 300 MG capsule Take 2 capsules (600 mg total) by mouth daily. 180 capsule 3   COVID-19 mRNA  vaccine 2023-2024 (COMIRNATY) syringe Inject into the muscle. (Patient not taking: Reported on 01/01/2023) 0.3 mL 0   COVID-19 mRNA vaccine 2023-2024 (COMIRNATY) syringe Inject 0.3 mLs into the muscle. (Patient not taking: Reported on 08/23/2023) 0.3 mL 0   cyclobenzaprine (FLEXERIL) 5 MG tablet Take 1 tablet (5 mg total) by mouth 3 (three) times daily as needed for muscle spasms. (Patient not taking: Reported on 08/23/2023) 30 tablet 0   docusate sodium (COLACE) 100 MG capsule Take 1 capsule (100 mg total) by mouth 2 (two) times daily. (Patient not taking: Reported on 08/23/2023) 60 capsule 0   gabapentin (NEURONTIN) 100 MG capsule 1 twice daily for 3 days then 2 twice daily for 3 days then 3 twice daily. (Patient not taking: Reported on 07/03/2023) 90 capsule 0   No facility-administered medications prior to visit.    PAST MEDICAL HISTORY: Past Medical History:  Diagnosis Date   Arthritis    Depression    Dysrhythmia    aflutter   PAF (paroxysmal atrial fibrillation) (HCC)    Palpitations     PAST SURGICAL HISTORY: Past Surgical History:  Procedure Laterality Date   A-FLUTTER ABLATION N/A 08/14/2019   Procedure: A-FLUTTER ABLATION;  Surgeon: Regan Lemming, MD;  Location: [facility];  Service: Cardiovascular;  Laterality: N/A;   INGUINAL HERNIA REPAIR     TOE SURGERY      FAMILY HISTORY: Family History  Problem Relation Age of Onset   Atrial fibrillation Father        pacemaker   Heart disease Paternal Grandmother        pacemaker    SOCIAL HISTORY: Social History   Socioeconomic History   Marital status: Married    Spouse name: Not on file   Number of children: 2   Years of education: Not on file   Highest education level: Not on file  Occupational History   Not on file  Tobacco Use   Smoking status: Never   Smokeless tobacco: Never  Vaping Use   Vaping status: Never Used  Substance and Sexual Activity   Alcohol use: Yes    Alcohol/week: 3.0  standard drinks of alcohol    Types: 2 Glasses of wine, 1 Cans of beer per week    Comment: daily   Drug use: Never   Sexual activity: Not on file  Other Topics Concern   Not on file  Social History Narrative   Lives at home with wife.  Retired Futures trader.    Social Determinants of Health   Financial Resource Strain: Not on file  Food Insecurity: Not on file  Transportation Needs: Not on file  Physical Activity: Not on file  Stress: Not on file  Social Connections: Not on file  Intimate Partner Violence: Not on file     PHYSICAL EXAM  GENERAL EXAM/CONSTITUTIONAL: Vitals:  Vitals:   09/11/23 1132  BP: 138/83  Pulse: 61  Weight: 172 lb 12.8 oz (78.4 kg)  Height: 5\' 9"  (1.753 m)   Body mass index is 25.52 kg/m. Wt Readings from Last 3 Encounters:  09/11/23 172 lb 12.8 oz (78.4 kg)  08/23/23 173 lb (78.5 kg)  04/24/22 168 lb (76.2 kg)   Patient is in no distress; well developed, nourished and groomed; neck is supple  CARDIOVASCULAR: Examination of carotid arteries is normal; no carotid bruits Regular rate and rhythm, no murmurs Examination of peripheral vascular system by observation and palpation is normal  EYES: Ophthalmoscopic exam of optic discs and posterior segments is normal; no papilledema or hemorrhages No results found.  MUSCULOSKELETAL: Gait, strength, tone, movements noted in Neurologic exam below  NEUROLOGIC: MENTAL STATUS:     09/11/2023   11:39 AM  MMSE - Mini Mental State Exam  Orientation to time 5  Orientation to Place 5  Registration 3  Attention/ Calculation 1  Recall 3  Language- name 2 objects 2  Language- repeat 1  Language- follow 3 step command 3  Language- read & follow direction 1  Write a sentence 1  Copy design 1  Total score 26   awake, alert, oriented to person, place and time recent and remote memory intact normal attention and concentration language fluent, comprehension intact, naming intact fund of  knowledge appropriate  CRANIAL NERVE:  2nd - no papilledema on fundoscopic exam 2nd, 3rd, 4th, 6th - pupils equal and reactive to light, visual fields full to confrontation, extraocular muscles intact, no nystagmus 5th - facial sensation symmetric 7th - facial strength symmetric 8th - hearing intact 9th - palate elevates symmetrically, uvula midline 11th - shoulder shrug symmetric 12th - tongue protrusion midline  MOTOR:  normal bulk and tone, full strength in the BUE, BLE  SENSORY:  normal and symmetric to light touch, temperature, vibration  COORDINATION:  finger-nose-finger, fine finger movements normal  REFLEXES:  deep tendon reflexes present and symmetric  GAIT/STATION:  narrow based gait; able to walk on toes, heels  and tandem; romberg is negative     DIAGNOSTIC DATA (LABS, IMAGING, TESTING) - I reviewed patient records, labs, notes, testing and imaging myself where available.  Lab Results  Component Value Date   WBC 6.2 03/29/2022   HGB 14.0 03/29/2022   HCT 40.5 03/29/2022   MCV 90.6 03/29/2022   PLT 169 03/29/2022      Component Value Date/Time   NA 140 03/29/2022 1030   NA 140 07/21/2019 1001   K 3.8 03/29/2022 1030   CL 106 03/29/2022 1030   CO2 24 03/29/2022 1030   GLUCOSE 101 (H) 03/29/2022 1030   BUN 15 03/29/2022 1030   BUN 15 07/21/2019 1001   CREATININE 0.99 03/29/2022 1030   CALCIUM 9.5 03/29/2022 1030   PROT 6.8 09/24/2019 0832   PROT 6.5 07/07/2019 1106   ALBUMIN 4.6 09/24/2019 0832   ALBUMIN 4.6 07/07/2019 1106   AST 20 09/24/2019 0832   ALT 22 09/24/2019 0832   ALKPHOS 43 09/24/2019 0832   BILITOT 1.7 (H) 09/24/2019 0832   BILITOT 1.5 (H) 07/07/2019 1106   GFRNONAA >60 03/29/2022 1030   GFRAA >60 10/01/2019 0323   Lab Results  Component Value Date   CHOL 153 03/04/2020   HDL 63 03/04/2020   LDLCALC 76 03/04/2020   TRIG 69 03/04/2020   CHOLHDL 2.4 03/04/2020   No results found for: "HGBA1C" No results found for:  "VITAMINB12" Lab Results  Component Value Date   TSH 3.170 07/07/2019       ASSESSMENT AND PLAN  72 y.o. year old male here with:   Dx:  1. Memory loss      PLAN:  MILD MEMORY IMPAIRMENT (could be related to hearing impairment vs marital strain vs other stress or neurodegenerative disease; no change in ADLs; only occurs with interactions with wife; minor elevation of pTau217 0.20H [0-0.18]) - check MRI brain and neuropsychology testing - obtain collateral history through patient's spouse, friends, children - try to stay active physically and get some exercise (at least 15-30 minutes per day) - eat a nutritious diet with lean protein, plants / vegetables, whole grains; avoid ultra-processed foods - increase social activities, brain stimulation, games, puzzles, hobbies, crafts, arts, music; try new activities; keep it fun! - aim for at least 7-8 hours sleep per night (or more) - avoid smoking and alcohol  Orders Placed This Encounter  Procedures   MR BRAIN W WO CONTRAST   Ambulatory referral to Neuropsychology   Return for pending test results, pending if symptoms worsen or fail to improve.    Suanne Marker, MD 09/11/2023, 12:30 PM Certified in Neurology, Neurophysiology and Neuroimaging  [facility] [address], [address] [place], [place] [address] [phone]

## 2023-09-19 ENCOUNTER — Telehealth: Payer: Self-pay

## 2023-09-19 NOTE — Telephone Encounter (Signed)
Referral faxed to Dr Clayborn Heron at [facility] [phone]           [phone]

## 2023-10-13 ENCOUNTER — Other Ambulatory Visit: Payer: Self-pay | Admitting: Psychiatry

## 2023-10-13 DIAGNOSIS — F411 Generalized anxiety disorder: Secondary | ICD-10-CM

## 2023-10-13 DIAGNOSIS — F319 Bipolar disorder, unspecified: Secondary | ICD-10-CM

## 2023-10-13 DIAGNOSIS — F4310 Post-traumatic stress disorder, unspecified: Secondary | ICD-10-CM

## 2023-10-22 ENCOUNTER — Encounter: Payer: Self-pay | Admitting: Diagnostic Neuroimaging

## 2023-10-22 NOTE — Telephone Encounter (Signed)
Spoke to patient order does say w/wo contrast . Per Dr Marjory Lies prefers with contrast for  MR brain Pt expressed understanding and thanked me for calling

## 2023-10-25 ENCOUNTER — Ambulatory Visit
Admission: RE | Admit: 2023-10-25 | Discharge: 2023-10-25 | Disposition: A | Discharge status: Home / Self Care | Payer: Medicare Other | Source: Ambulatory Visit | Attending: Diagnostic Neuroimaging | Admitting: Diagnostic Neuroimaging

## 2023-10-25 DIAGNOSIS — R413 Other amnesia: Secondary | ICD-10-CM | POA: Diagnosis not present

## 2023-10-25 MED ORDER — GADOPICLENOL 0.5 MMOL/ML IV SOLN
7.5000 mL | Freq: Once | INTRAVENOUS | Status: AC | PRN
  Administered 2023-10-25: 7.5 mL via INTRAVENOUS

## 2023-10-27 ENCOUNTER — Other Ambulatory Visit: Payer: Self-pay | Admitting: Psychiatry

## 2023-10-27 DIAGNOSIS — F319 Bipolar disorder, unspecified: Secondary | ICD-10-CM

## 2023-10-27 DIAGNOSIS — F4310 Post-traumatic stress disorder, unspecified: Secondary | ICD-10-CM

## 2023-10-27 DIAGNOSIS — F411 Generalized anxiety disorder: Secondary | ICD-10-CM

## 2023-10-28 NOTE — Progress Notes (Signed)
Mild atrophy and chronic small vessel ischemic disease. No other major findings. Follow up neuropsychology testing. -[facility]

## 2023-11-07 NOTE — Telephone Encounter (Signed)
 Spoke to patient gave MR Brain results Gave Dr Salli Crawley recommendations Pt expressed understanding and thanked me for calling

## 2024-01-01 ENCOUNTER — Ambulatory Visit: Payer: Medicare Other | Admitting: Psychiatry

## 2024-01-28 ENCOUNTER — Other Ambulatory Visit (HOSPITAL_BASED_OUTPATIENT_CLINIC_OR_DEPARTMENT_OTHER): Payer: Self-pay

## 2024-02-20 ENCOUNTER — Ambulatory Visit (INDEPENDENT_AMBULATORY_CARE_PROVIDER_SITE_OTHER): Admitting: Psychiatry

## 2024-02-20 DIAGNOSIS — Z91199 Patient's noncompliance with other medical treatment and regimen due to unspecified reason: Secondary | ICD-10-CM

## 2024-02-21 ENCOUNTER — Other Ambulatory Visit: Payer: Self-pay

## 2024-02-21 ENCOUNTER — Telehealth: Payer: Self-pay | Admitting: Psychiatry

## 2024-02-21 DIAGNOSIS — F411 Generalized anxiety disorder: Secondary | ICD-10-CM

## 2024-02-21 DIAGNOSIS — F319 Bipolar disorder, unspecified: Secondary | ICD-10-CM

## 2024-02-21 DIAGNOSIS — F4310 Post-traumatic stress disorder, unspecified: Secondary | ICD-10-CM

## 2024-02-21 MED ORDER — LITHIUM CARBONATE 300 MG PO CAPS: 600.0000 mg | ORAL_CAPSULE | Freq: Every day | ORAL | 0 refills | Status: DC

## 2024-02-21 MED ORDER — LITHIUM CARBONATE 150 MG PO CAPS: 150.0000 mg | ORAL_CAPSULE | Freq: Every day | ORAL | 0 refills | Status: DC

## 2024-02-21 NOTE — Telephone Encounter (Signed)
 Pended RF for both doses of Lithium  to Dr. Toi Foster for his review.

## 2024-02-21 NOTE — Telephone Encounter (Signed)
 Lithium  level is not visible in Epic.  It could not have been 7.1.  that would be extremely toxic and intolerable. Normal levels 0.5-1.4.

## 2024-02-21 NOTE — Telephone Encounter (Signed)
 Patient lvm at 11:43 that his lithium  level was 7.1

## 2024-02-21 NOTE — Telephone Encounter (Signed)
 Pt called at 9:19a requesting refill of Lithium  300mg  and 150mg  to   [facility] [employer] [employer] - [place], [place] - [address], [place] [address] Phone: [phone]  Fax: [phone]   Next appt 7/23.

## 2024-02-21 NOTE — Telephone Encounter (Signed)
 Continue lithium  750 mg daily. 750 mg has helped but caused tremor. 600 mg does not cause tremor.   Pt said he had his physical last week and lithium  level was 7.1.

## 2024-02-21 NOTE — Progress Notes (Signed)
 No show

## 2024-03-16 ENCOUNTER — Telehealth: Payer: Self-pay | Admitting: Psychiatry

## 2024-03-16 NOTE — Telephone Encounter (Signed)
 Patient notified of recommendations.

## 2024-03-16 NOTE — Telephone Encounter (Signed)
 The simplest solution is to take Xanax  1/2 -1 tablet at night when he has trouble going to sleep.  It will work faster if he puts it under his tongue but he doesn't have to .  He could keep it by his bed and only take it if not asleep in 20 mins or so.

## 2024-03-16 NOTE — Telephone Encounter (Signed)
 Pt reporting has trouble getting to sleep but can stay asleep once he does get to sleep. 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 Reports marital issues. He is very irritable during conversation. Tried to review meds and when I asked him about alprazolam /Xanax  he said he didn't know what it was and later said he took Xanax  PRN. Lithium  750 mg.  He said he is not depressed, no anxiety.

## 2024-03-16 NOTE — Telephone Encounter (Signed)
 Pt lvm that he under stress. Over the last month he has had intrusive thoughts and his mind is racing.Has a hard time falling asleep because he is thinking about all the friction that he had that day with his wife.Aaron AasHe feels like he is more depressed. Please call him and see if any medication that could help him.Please give him a call at [phone]

## 2024-03-31 ENCOUNTER — Other Ambulatory Visit: Payer: Self-pay | Admitting: Psychiatry

## 2024-03-31 ENCOUNTER — Encounter: Payer: Self-pay | Admitting: Psychiatry

## 2024-03-31 ENCOUNTER — Ambulatory Visit: Admitting: Psychiatry

## 2024-03-31 DIAGNOSIS — F319 Bipolar disorder, unspecified: Secondary | ICD-10-CM

## 2024-03-31 DIAGNOSIS — F411 Generalized anxiety disorder: Secondary | ICD-10-CM | POA: Diagnosis not present

## 2024-03-31 DIAGNOSIS — F4310 Post-traumatic stress disorder, unspecified: Secondary | ICD-10-CM | POA: Diagnosis not present

## 2024-03-31 DIAGNOSIS — Z79899 Other long term (current) drug therapy: Secondary | ICD-10-CM | POA: Diagnosis not present

## 2024-03-31 MED ORDER — LITHIUM CARBONATE ER 300 MG PO TBCR: 300.0000 mg | EXTENDED_RELEASE_TABLET | Freq: Every day | ORAL | 0 refills | Status: DC

## 2024-03-31 MED ORDER — LITHIUM CARBONATE ER 450 MG PO TBCR
450.0000 mg | EXTENDED_RELEASE_TABLET | Freq: Every day | ORAL | 0 refills | Status: DC
Start: 2024-03-31 — End: 2024-06-05

## 2024-03-31 MED ORDER — LORAZEPAM 0.5 MG PO TABS: 0.5000 mg | ORAL_TABLET | Freq: Two times a day (BID) | ORAL | 1 refills | Status: DC | PRN

## 2024-03-31 NOTE — Progress Notes (Signed)
 Nathaniel Bush [id number] 1951-07-17 73 y.o.  Subjective:   [name] is a 73 y.o. (DOB 12/04/1950) male.  Chief Complaint:  Chief Complaint  Patient presents with   Follow-up    HPI   Nathaniel Bush presents to the office today for follow-up of depression and anxiety.    seen in March 16, 2019.  It was relatively stable except for marital stress.  No meds were changed.  09/15/2019 appointment with the following noted: Had Aflutter with successful ablation. Overall mood good without change since here.  Nothing's changed.  Including meds.  Handling stress of socialization and Covid very well.  No unusual issues.. Really slowed down watching the news bc creating stress. Not taking much Xanax .  No problems with the lithium .  No meds were changed.  03/14/2020 appointment with the following noted: Back fusion Christmas.  Doing great with that.  Dr. Mavis.  Riding bike in 3-4 weeks. 10 years ago would be down and thinking neg things when first awakens in morning and back that way again.  Stressor wife unhappy with her job and complains of it a lot and wife doesn't like things going on in the family relationships.  These don't help her dealing with him bc she doesn't treat him well.  Her work is getting better and that is helping so he's more hopeful.  Not really look for med changes. Rare Xanax  helps him remain calm with wife and tolerates it well.  Plan: no med changes except suggest vit D in winter  04/14/20 TC: Patient called and said that over the last two weeks he has been hearing what people say to him but can't understand the words or what they mean. He said it was like his brain is in a fog. He also said that his right hand is shaking or have tremors. He wants to know is this the cause of the lithium  . He just had a physical and all is good there MD response: Reduce lithium  from 750 mg to 600   mg daily and see if that helps.  06/14/2020 phone call patient stating  he started feeling bad in the morning after reducing lithium  to 600 mg daily and he increase it back to 750 mg.  He called complaining he was still having some depression.  It was suggested he move up his appointment.  09/12/20 appt with following noted: Currently back to 600 mg lithium .  Had gone back up to 750 mg.  Now doesn't think it  was meds making him feel bad and he got better and  Now back to 600 mg lithium  for 6 weeks.  Doing well mood wise now.  Same marital issues and handling it as best he can.  Journaling is therapeutic and grounding.  Want to talk about it but she is unapproachable.  Thinking about counseling again.  [name].  Says he shows her his best side everyday.  She refuses to be close to him or touch him.   Rare Xanax . Tremor gone. Plan: Overall not depressed.  No med changes today  01/09/2021 appointment with the following noted: In jan taking lithium  450 and level 0.5. Then started feeling more stressed with wife and decided to increase to  Wife gotten meaner and wants to talk down to hip and resents that.  Xanax  helps but not sure lithium  helps.  Only used about once weekly. When initially wakes thinks of wife and gets down.  Can't talk with wife bc she  gets angry.  He feels powerless to do anything about it. He feels wife has deep seated unknown resentment against him but refuses to go to counseling with him. Increased lithium  to 750 mg about Feb without any changes noticed.   Sleep good.   No excess alcohol. Plan: Continue lithium  but need to check labs.  03/13/2021 appt noted: Disc marital situation.   She denied resentment against him.  But then started a litany of resentments for years.  Felt good he was right.  She started to try  be nicer. Journals and it helps. But still feels terrible.  Has done the housework.  She wants to travel and he doesn't want to do it if she doesn't care for him. More impatient with drivers again lately.  I don't know how to take  the edge off that.  Blows horn too much.  Can be patient in other circumstances.  Gets annoyed with stoplights.  Depressed about situation with wife.  Doesn't think he's overall depressed.  Still awakens thinking of marriage.  Says he didn't have a good attitude about taking meds in the past and willing to retry meds. No sex and wife asexual. Not depressed but frustrated with marriage. Patient reports stable mood and denies depressed or irritable moods.  Patient denies any recent difficulty with anxiety.  Patient denies difficulty with sleep initiation or maintenance. Denies appetite disturbance.  Patient reports that energy and motivation have been good.  Patient denies any difficulty with concentration.  Patient denies any suicidal ideation. Plan: Continue lithium  750 mg daily. Start sertraline  25 mg 1/2 daily for 1 week then 1 daily for irritability and depressive sx  03/29/2021 phone call complaining of GI distress from sertraline  25 mg daily.  He is very med sensitive so he was encouraged to try 12.5 mg daily.  07/31/21 appt noted: Sertraline  for 2 weeks and stopped DT GI problems. Mood in general good.  Rad on bipolar disorder. Notices there's times he feels happy and does things he regrets the next day.  Not a big issue but at least I'm paying attention and notices.  Not going in as deep into depression.  Not as much letting wife getting to him. taking lithium  750 mg daily. Bummed he just turned 73 yo.  He's still biking. M had MI Seeing therapist Nathaniel Bush. No med changes today and he agrees with the exception of adding vitamin D  Continue lithium  750 mg daily.  10/19/21 appt ntoed: Lithium  0.8 Dr. Gwenevere office Mild intermittent intentional tremor.  Not severe. Real emotional through the holidays, overly, intense. Sentimental with movie.  Also get very mad but not at people but at things when doing projects.  Always polite in public. Therapy, [name],  has helped improve  how he handles Nathaniel Bush, coping better with it. Anxiety in waves.  A lot of stress with back pain with injections 1 week ago helped. Only thing that works is 1/2 Xanax  prn but still using bottle from April will work in 15 mins. Plan: No med changes today and he agrees with the exception of adding vitamin D  Continue lithium  750 mg daily. Lithium  level per PCP. Pt says it was good but would like to get it.  03/26/22 appt noted: Tested positive for Covid 2 weeks ago. No severe sx. Planning back surgery.  No Covid SX. Mood good last week. Therapist hleped a lot with coping with conflict with Nathaniel Bush.  Handling it better usually.  She gets into resentment  about things 40 years ago.  She has a list of resentments.  She weaponizes it.  Really pleased with therapy helpful. She never says I'm sorry. Nathaniel Angus, PhD.  Then he gets upset and tremors then and gets anxious then. Uses Xanax  prn. Tolerates it. Still rides bike and takes care of his mother and functions well. Tremor otherwise manageable unless upset. Plan: Continue lithium  750 mg daily.  05/17/22 TC :  Pt LVM reporting  following visit about 6 weeks ago, tremors in arm and leg continued after increasing Lithium  to 750 mg. Pt decreased Lithium  to 600 mg tremors stopped this time and no increase in depression at this time. Pt requesting Rx for 600 mg to [facility] for 90 day.     07/27/22 TC;  Pt stated tremors stopped but he is having increased anxiety on 600 mg of lithium  please advise      08/03/22 TC:  Lihtium level 0.7 on 600 mg daily which is about what the level has remained over the last few years.  It is stable.  I don't think we should increase or he is likely to have tremors.  I suggest he use the Xanax  for the anxiety he has been noticing.     The rest of his labs were normal (including Cr and Ca) except he needs to follow up with his doctor about the red blood cells in his urine as his PCP suggested.  This is unrelated to lithium .      10/02/21 appt noted: Tremor went away with reduction in lithium .   Overthinking things and overly sensitive to things.  Very sensitive to the way she speaks to me.  Don't feel anxious.  Wonders if shouldn't have reduced the tremor. Counseling with marriage has helped him communicate but wife unchanged. Happier mood and more talkative if has alcohol but not drinking excessively. Back surgery went well. Xanax  can help his rumination. Plan: Alternative of gabapentin  off label for tinnitus, might also help with irritability and over thinking things.   He wants to pursue  Continue lithium  750 mg daily vs 600 mg daily.  750 mg has helped but caused tremor.  600 mg does not cause tremor.  10/26/2022 phone call: He stopped gabapentin  due to nausea.  Wants to increase lithium  to 750 because of PTSD symptoms and over thinking and anxiety. Plan: Agreed to increase lithium  to 750 mg daily.  Offered option of low-dose gabapentin  100 mg twice daily.  12/31/22 appt noted: Less tremor now than in the past using the IR lithium .   Increased back to lithium  750 mg HS.  Was stressed out with wife and wanted to increase it. Has had good counseling with Nathaniel Kirks.  Others see wife as angry and hot tempered.  She's incapable of being affectionate and attentive to his needs.  Lacking compassion.   Tinnitus under stress is louder.   Handles stress pretty well except strongest anxiety is driving.    Flying use to bother him as bad but better with last trip alone to [place].   Plan: Alternative of gabapentin  off label for tinnitus, might also help with irritability and over thinking things.   He wants to try again at lower dose 100-200 mg BID.   Continue lithium  750 mg daily.  750 mg has helped but caused tremor.  600 mg does not cause tremor.  07/03/23 appt noted: Meds:  lithium  750, Xanax  0.125 about 1/3 of days. Wife stressing him lately.  Changed person.  She's  a lot more negative than in the past.  She's not  sleeping much.   She is very involved with her sister who bought a house in [place].  Gets upset with her $ decisions which stress him. Benefit counseling and meds.  Letting things slide off his back.  Overall doing remarkably well.  Has CBT skills. Counseling 50 sessions. Battles some irritability but handling it well. Constant tinnitus.  03/16/24  Pt reporting has trouble getting to sleep but can stay asleep once he does get to sleep. 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 Reports marital issues. He is very irritable during conversation. Tried to review meds and when I asked him about alprazolam /Xanax  he said he didn't know what it was and later said he took Xanax  PRN. Lithium  750 mg.  He said he is not depressed, no anxiety.     MD resp:  The simplest solution is to take Xanax  1/2 -1 tablet at night when he has trouble going to sleep. It will work faster if he puts it under his tongue but he doesn't have to . He could keep it by his bed and only take it if not asleep in 20 mins or so.   03/31/24 appt noted:  Med: lithium  450 HS, prn alprazolam  Started waking ruminating on px with wife.  Then when out of bed it would stop. Tried changing timing of lithium  later and it seemed to help. Tendency to be moody at times during the day with off an don ruminating about marriage.  Chronic mood reactivity over wife's berating him.   Wonders about trying slow release version of lithium .   Will tend to go over conversations in his head.   Went to family counseling with wife.   Hesitate to take Xanax  bc sedating and hangover. Has done individual counseling too.  Will be ok with it if wife leaves but he doesn't want it to happen.  Wife doesn't want to do anything.     Still riding bike regularly.   Past psychiatric medication trials include :  venlafaxine, Zoloft , mirtazapine,  Viibryd which cause GI pain, Wellbutrin, duloxetine,  Celexa, Lexapro, Trintellix.  Sertraline , nefazodone,  inVega, risperidone, Abilify, lamotrigine, carbamazepine, Trileptal, gabapentin  Lithium  600-750  Cerefolin NAC, Deplin,  Cytomel, buspirone,  propranolol   trazodone 25 mg which cause manic symptoms,    I am uncertain if he is taking Depakote before  Review of Systems:  Review of Systems  Cardiovascular:  Negative for chest pain.  Musculoskeletal:  Positive for arthralgias and back pain.  Neurological:  Positive for tremors and headaches.  Psychiatric/Behavioral:  Positive for dysphoric mood. Negative for agitation, behavioral problems, confusion, decreased concentration, hallucinations, self-injury, sleep disturbance and suicidal ideas. The patient is not nervous/anxious and is not hyperactive.     Medications: I have reviewed the patient's current medications.  Current Outpatient Medications  Medication Sig Dispense Refill   lithium  carbonate (ESKALITH ) 450 MG ER tablet Take 1 tablet (450 mg total) by mouth daily. 90 tablet 0   lithium  carbonate (LITHOBID ) 300 MG ER tablet Take 1 tablet (300 mg total) by mouth daily at 12 noon. 90 tablet 0   LORazepam (ATIVAN) 0.5 MG tablet Take 1 tablet (0.5 mg total) by mouth 2 (two) times daily as needed for anxiety. 30 tablet 1   acetaminophen  (TYLENOL ) 500 MG tablet Take 1,000 mg by mouth every 6 (six) hours as needed (  pain.).     COVID-19 mRNA vaccine 2023-2024 (COMIRNATY ) syringe Inject into the muscle. (Patient not taking: Reported on 01/01/2023) 0.3 mL 0   COVID-19 mRNA vaccine 2023-2024 (COMIRNATY ) syringe Inject 0.3 mLs into the muscle. (Patient not taking: Reported on 08/23/2023) 0.3 mL 0   cyclobenzaprine  (FLEXERIL ) 5 MG tablet Take 1 tablet (5 mg total) by mouth 3 (three) times daily as needed for muscle spasms. (Patient not taking: Reported on 08/23/2023) 30 tablet 0   docusate sodium  (COLACE) 100 MG capsule Take 1 capsule (100 mg total) by mouth 2 (two) times daily. (Patient not  taking: Reported on 08/23/2023) 60 capsule 0   gabapentin  (NEURONTIN ) 100 MG capsule 1 twice daily for 3 days then 2 twice daily for 3 days then 3 twice daily. (Patient not taking: Reported on 03/31/2024) 90 capsule 0   No current facility-administered medications for this visit.    Medication Side Effects: None except mild GI  Allergies: No Known Allergies  Past Medical History:  Diagnosis Date   Arthritis    Depression    Dysrhythmia    aflutter   PAF (paroxysmal atrial fibrillation) (HCC)    Palpitations     Family History  Problem Relation Age of Onset   Atrial fibrillation Father        pacemaker   Heart disease Paternal Grandmother        pacemaker    Social History   Socioeconomic History   Marital status: Married    Spouse name: Not on file   Number of children: 2   Years of education: Not on file   Highest education level: Not on file  Occupational History   Not on file  Tobacco Use   Smoking status: Never   Smokeless tobacco: Never  Vaping Use   Vaping status: Never Used  Substance and Sexual Activity   Alcohol use: Yes    Alcohol/week: 3.0 standard drinks of alcohol    Types: 2 Glasses of wine, 1 Cans of beer per week    Comment: daily   Drug use: Never   Sexual activity: Not on file  Other Topics Concern   Not on file  Social History Narrative   Lives at home with wife.  Retired Futures trader.    Social Drivers of Corporate investment banker Strain: Not on file  Food Insecurity: Not on file  Transportation Needs: Not on file  Physical Activity: Not on file  Stress: Not on file  Social Connections: Not on file  Intimate Partner Violence: Not on file    Past Medical History, Surgical history, Social history, and Family history were reviewed and updated as appropriate.   W has no immediate plans to retire.  Please see review of systems for further details on the patient's review from today.   Objective:   Physical Exam:  There were  no vitals taken for this visit.  Physical Exam Constitutional:      General: He is not in acute distress.  Musculoskeletal:        General: No deformity.   Neurological:     Mental Status: He is alert and oriented to person, place, and time.     Coordination: Coordination normal.     Gait: Gait normal.   Psychiatric:        Attention and Perception: He is attentive.        Mood and Affect: Mood is not anxious or depressed. Affect is not labile, blunt, angry or inappropriate.  Speech: Speech normal.        Behavior: Behavior normal.        Thought Content: Thought content normal. Thought content is not delusional. Thought content does not include homicidal or suicidal ideation. Thought content does not include suicidal plan.        Cognition and Memory: Cognition normal.        Judgment: Judgment normal.     Comments: Insight intact. No auditory or visual hallucinations.  Better irritable intentionally with effort.  and anxiety  Residual dep mainly just AM and when wife gets angry at him    Lab Review:     Component Value Date/Time   NA 140 03/29/2022 1030   NA 140 07/21/2019 1001   K 3.8 03/29/2022 1030   CL 106 03/29/2022 1030   CO2 24 03/29/2022 1030   GLUCOSE 101 (H) 03/29/2022 1030   BUN 15 03/29/2022 1030   BUN 15 07/21/2019 1001   CREATININE 0.99 03/29/2022 1030   CALCIUM 9.5 03/29/2022 1030   PROT 6.8 09/24/2019 0832   PROT 6.5 07/07/2019 1106   ALBUMIN 4.6 09/24/2019 0832   ALBUMIN 4.6 07/07/2019 1106   AST 20 09/24/2019 0832   ALT 22 09/24/2019 0832   ALKPHOS 43 09/24/2019 0832   BILITOT 1.7 (H) 09/24/2019 0832   BILITOT 1.5 (H) 07/07/2019 1106   GFRNONAA >60 03/29/2022 1030   GFRAA >60 10/01/2019 0323       Component Value Date/Time   WBC 6.2 03/29/2022 1030   RBC 4.47 03/29/2022 1030   HGB 14.0 03/29/2022 1030   HGB 14.7 07/21/2019 1001   HCT 40.5 03/29/2022 1030   HCT 42.0 07/21/2019 1001   PLT 169 03/29/2022 1030   PLT 189 07/21/2019  1001   MCV 90.6 03/29/2022 1030   MCV 90 07/21/2019 1001   MCH 31.3 03/29/2022 1030   MCHC 34.6 03/29/2022 1030   RDW 12.0 03/29/2022 1030   RDW 11.8 07/21/2019 1001   LYMPHSABS 1.5 09/24/2019 0832   LYMPHSABS 1.6 07/07/2019 1106   MONOABS 0.5 09/24/2019 0832   EOSABS 0.4 09/24/2019 0832   EOSABS 0.3 07/07/2019 1106   BASOSABS 0.1 09/24/2019 0832   BASOSABS 0.1 07/07/2019 1106    No results found for: POCLITH, LITHIUM    No results found for: PHENYTOIN, PHENOBARB, VALPROATE, CBMZ  Last lithium  level May 13, 2018 on 750 mg lithium  per day was 0.7  Lithium  September 18, 2019= 0.8 Lithium  03/2020 = 0.72   Labs received did not dated April 07, 2019: Normal BMP with creatinine 1.0 calcium 9.6.  Normal CBC and lipid profile.  Normal TSH Vitamin D 36.9 Testosterone 7.6 with normal 6.6-18.1 Lithium  0.8  10/06/20 lithium  0.5 on 450 mg daily  03/08/2021 lithium  level 0.8 on 750 mg daily Dr.Perini  Lithium  0.8 Dr. Gwenevere office again since here on 750 mg daily.  Jan 2023  Lihtium level 0.7 on 600 mg daily   Lithium  0.8 Dr. Gwenevere office again since here on 750 mg daily.  Jan 2023 Lithium  0.7 Dr. Gwenevere office again since here on 750 mg daily.  Nov 2023 Lithium  0.6 on 05/13/23 on 750 mg  Assessment: Plan:   Fawaz was seen today for follow-up.  Diagnoses and all orders for this visit:  Bipolar affective disorder, rapid cycling (HCC) -     lithium  carbonate (LITHOBID ) 300 MG ER tablet; Take 1 tablet (300 mg total) by mouth daily at 12 noon. -     lithium  carbonate (ESKALITH ) 450 MG  ER tablet; Take 1 tablet (450 mg total) by mouth daily.  Generalized anxiety disorder -     LORazepam (ATIVAN) 0.5 MG tablet; Take 1 tablet (0.5 mg total) by mouth 2 (two) times daily as needed for anxiety.  PTSD (post-traumatic stress disorder)  Lithium  use     Medication sensitive but tolerates lithium  well.  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  He  is medication sensitive and is not typically stable for very long. He has significant benefit from the lithium  for his depression which is very helpful.    He acknowledges the benefit of the lithium  overall and that his overall mood has been good for the last several months. Disc mood cycling and he's getting better at recognizing up cycles which are not severe. He tends to stay busy with retirement and cycles and it has helped.  Still tendency to be dep first thing in the morning.  Had neuropsych testing at wife's request in Feb 2025 and it was normal.   MRI contrasted without acute concerns.    Overall doing well.  He has tried multiple medications and lithium  has been the best.   Marital stresses chronic but no worse.  Stress dealing with wife.  Supportive therapy since dealing with this.   His counseling helped.    Switch to ER lithium  750 mg daily to see if it is more consistent with mood during the delay.    Accepting of tremor in R hand only.    Lithium  level per PCP.  Lithium  0.8 Dr. Gwenevere office again since here on 750 mg daily.  11/2023  He might retry gabapentin  for tinnitus.  Counseled patient regarding potential benefits, risks, and side effects of lithium  to include potential risk of lithium  affecting thyroid and renal function.  Discussed need for periodic lab monitoring to determine drug level and to assess for potential adverse effects.  Counseled patient regarding signs and symptoms of lithium  toxicity and advised that they notify office immediately or seek urgent medical attention if experiencing these signs and symptoms.  Patient advised to contact office with any questions or concerns. Disc poss lithium  tremor and it's impact.  Consider Rexulti, Vraylar, clonidine  Change alprazolam  to lorazepam  for less sedation 0.25-0.5 mg prn We discussed the short-term risks associated with benzodiazepines including sedation and increased fall risk among others.  Discussed  long-term side effect risk including dependence, potential withdrawal symptoms, and the potential eventual dose-related risk of dementia.  But recent studies from 2020 dispute this association between benzodiazepines and dementia risk. Newer studies in 2020 do not support an association with dementia.  Option propranolol for tremor will retry if needed.  Supportive therapy dealing with wife's accusation.   Therapy is helping.    3-6 mos  Lorene Macintosh, MD, DFAPA    Please see After Visit Summary for patient specific instructions.  Future Appointments  Date Time Provider Department Center  04/22/2024  3:00 PM Cottle, Lorene KANDICE Raddle., MD [facility] None      No orders of the defined types were placed in this encounter.      -------------------------------

## 2024-04-12 IMAGING — XA DG MYELOGRAPHY LUMBAR INJ LUMBOSACRAL
13 of 16 series · 13 of 16 positions shown · non-contrast
Comparison: MRI lumbar spine dated July 18, 2021.

CLINICAL DATA: Right-sided low back and buttock pain. Right
anterior inner thigh pain. Symptoms for the past few months. Prior
L4-L5 fusion several years ago.
TECHNIQUE: Contiguous axial images were obtained through the lumbar spine after
the intrathecal infusion of contrast. Coronal and sagittal
reconstructions were obtained of the axial image sets.

[Series 1: vasc adipose · 1 of 1 slices shown (1 of 10)]
[im 1/1]
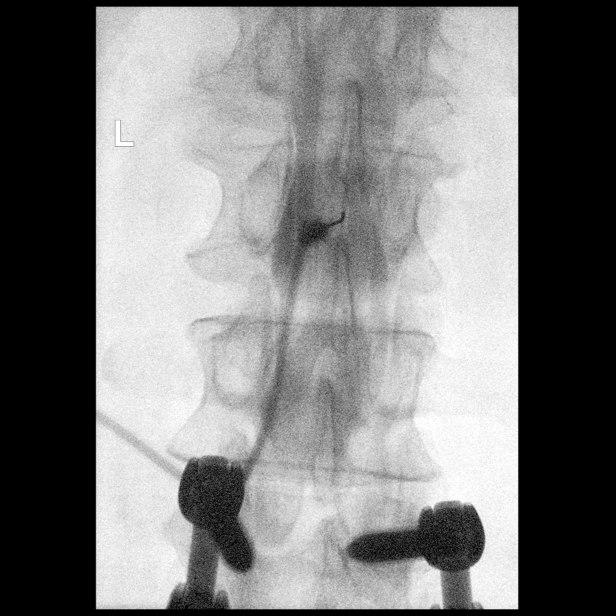

[Series 1: w lumbar spine lat · 0.15mm/px · 1 of 1 slices shown]
[im 1/1]
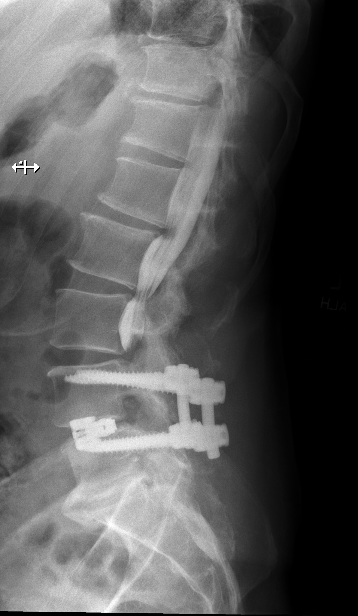

[Series 2: w lumbar spine flexion · 0.15mm/px · 1 of 1 slices shown]
[im 1/1]
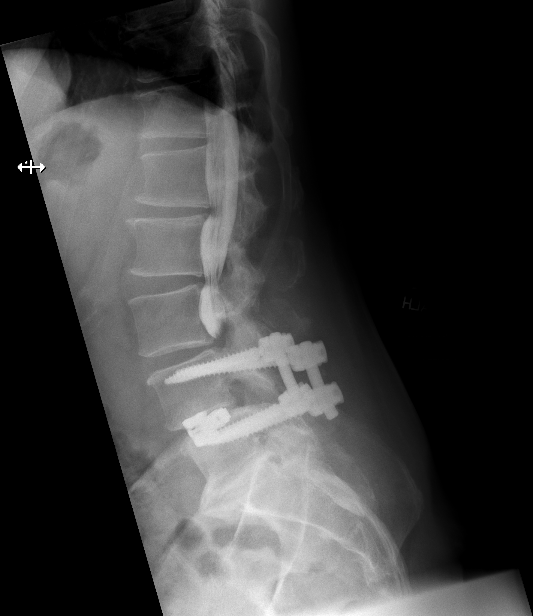

[Series 3: vasc adipose · 1 of 1 slices shown (2 of 10)]
[im 1/1]
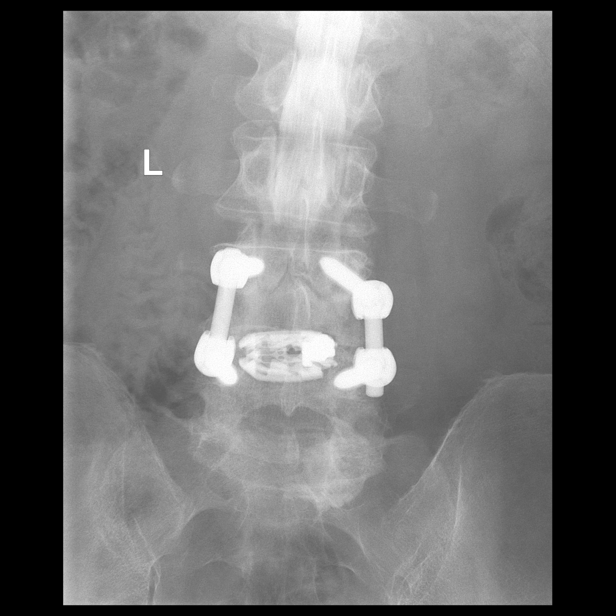

[Series 3: w lumbar spine extension · 0.15mm/px · 1 of 1 slices shown]
[im 1/1]
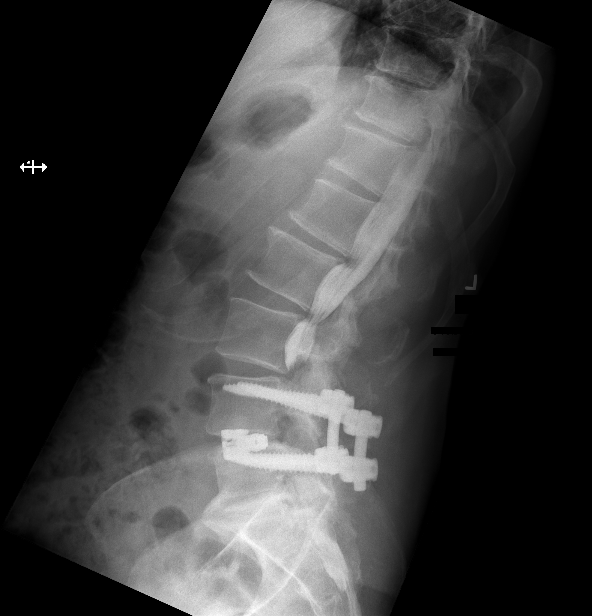

[Series 4: vasc adipose · 1 of 1 slices shown (3 of 10)]
[im 1/1]
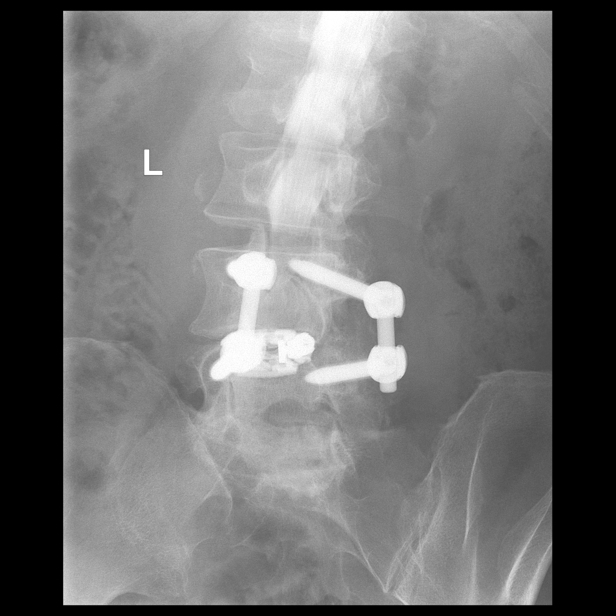

[Series 6: vasc adipose · 1 of 1 slices shown (4 of 10)]
[im 1/1]
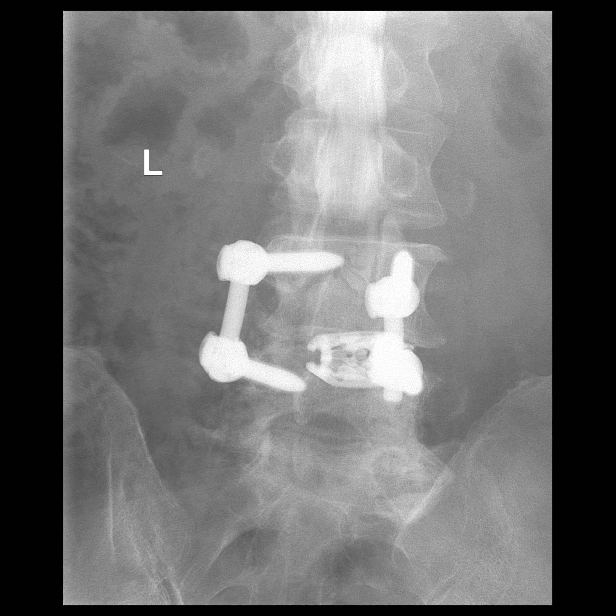

[Series 7: vasc adipose · 1 of 1 slices shown (5 of 10)]
[im 1/1]
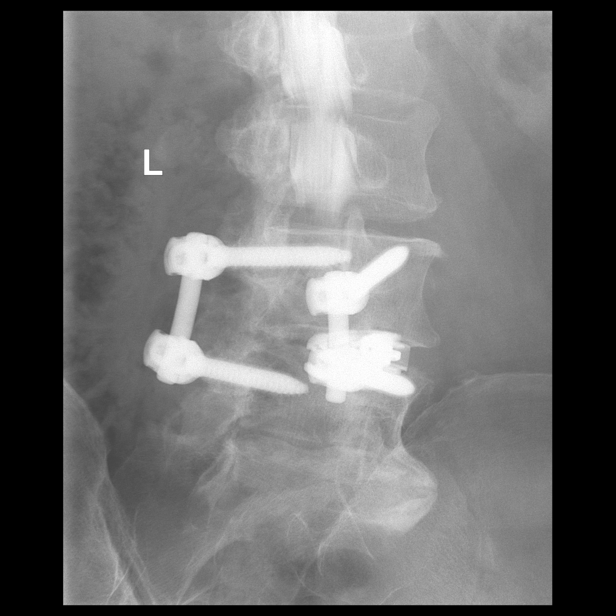

[Series 8: vasc adipose · 1 of 1 slices shown (6 of 10)]
[im 1/1]
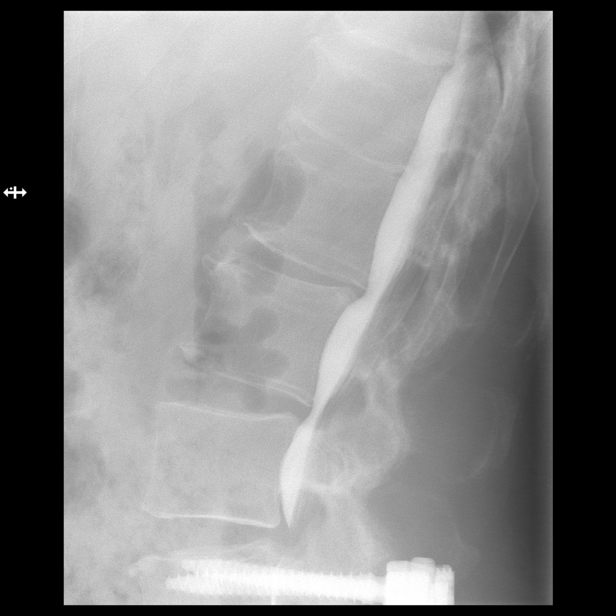

[Series 9: vasc adipose · 1 of 1 slices shown (7 of 10)]
[im 1/1]
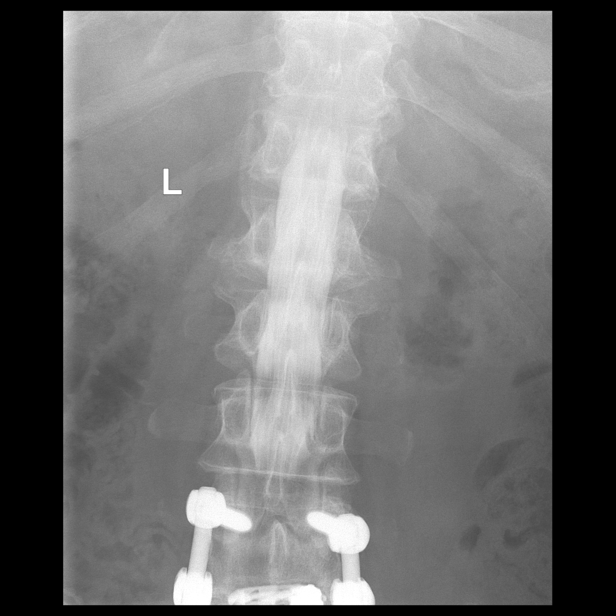

[Series 10: vasc adipose · 1 of 1 slices shown (8 of 10)]
[im 1/1]
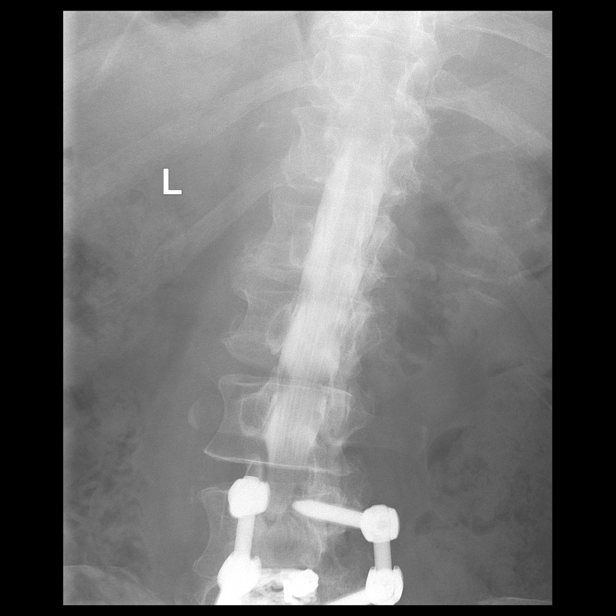

[Series 12: vasc adipose · 1 of 1 slices shown (9 of 10)]
[im 1/1]
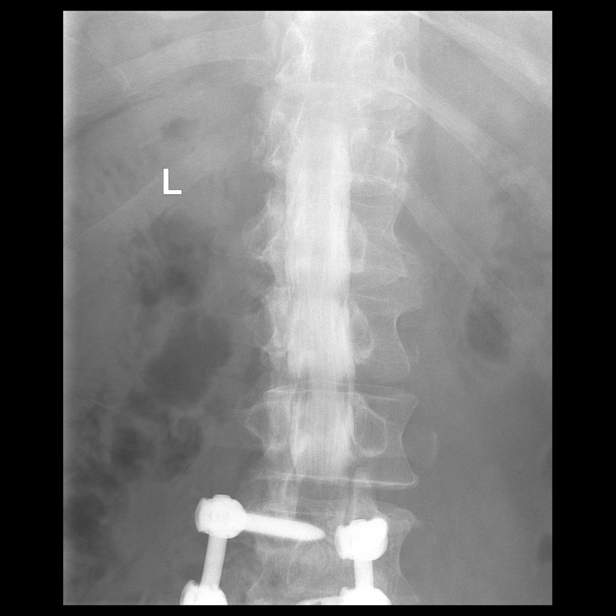

[Series 13: vasc adipose · 1 of 1 slices shown (10 of 10)]
[im 1/1]
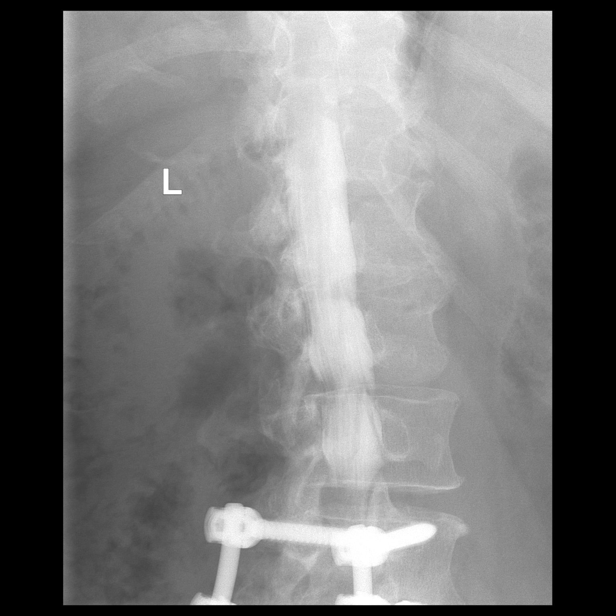

[13 of 16 positions shown; findings below may reference images not displayed]

EXAM:
LUMBAR MYELOGRAM

CT LUMBAR MYELOGRAM

FLUOROSCOPY:
Radiation Exposure Index (as provided by the fluoroscopic device):
11.9 mGy Kerma

PROCEDURE:
After thorough discussion of risks and benefits of the procedure
including bleeding, infection, injury to nerves, blood vessels,
adjacent structures as well as headache and CSF leak, written and
oral informed consent was obtained. Consent was obtained by Dr.
Fedeli Eusepi. Time out form was completed.

Patient was positioned prone on the fluoroscopy table. Local
anesthesia was provided with 1% lidocaine without epinephrine after
prepped and draped in the usual sterile fashion. Puncture was
performed at L1-L2 using a 3 1/2 inch 22-gauge spinal needle via
left interlaminar approach. Using a single pass through the dura,
the needle was placed within the thecal sac, with return of clear
CSF. 15 mL of Isovue E-800 was injected into the thecal sac, with
normal opacification of the nerve roots and cauda equina consistent
with free flow within the subarachnoid space.

I personally performed the lumbar puncture and administered the
intrathecal contrast. I also personally supervised acquisition of
the myelogram images.
FINDINGS: LUMBAR MYELOGRAM FINDINGS:

Unchanged mild retrolisthesis at L1-L2, L2-L3, and L5-S1. No dynamic
instability. Prior L4-L5 TLIF. Small ventral extradural defects at
L1-L2 and L2-L3. Moderate ventral extradural defect at L3-L4.
Moderate spinal canal stenosis at L2-L3. Severe spinal canal
stenosis at L3-L4.

CT LUMBAR MYELOGRAM FINDINGS:

Segmentation: Standard.

Alignment: Unchanged mild retrolisthesis at L1-L2, L2-L3, and L5-S1.
Unchanged trace fused anterolisthesis at L4-L5.

Vertebrae: No acute fracture or other focal pathologic process.
Prior L4-L5 TLIF. Mild subsidence of the interbody graft into the L5
superior endplate with minimal bridging bone across the disc space.

Conus medullaris and cauda equina: Conus extends to the L1-L2 level.
Conus and cauda equina appear normal.

Paraspinal and other soft tissues: Bilateral renal simple cysts. No
follow-up imaging is recommended.

Disc levels:

T11-T12: Minimal disc bulging and mild bilateral facet arthropathy.
No stenosis.

T12-L1:  Negative.

L1-L2: Unchanged mild disc bulging and bilateral facet arthropathy.
No stenosis.

L2-L3: Unchanged mild disc bulging and bilateral facet arthropathy.
Unchanged mild-to-moderate spinal canal stenosis. No neuroforaminal
stenosis.

L3-L4: Progressive moderate disc bulging and bilateral facet
arthropathy. Progressive now severe spinal canal stenosis. Unchanged
mild right neuroforaminal stenosis. No left neuroforaminal stenosis.

L4-L5: Prior posterior decompression and PLIF. No residual stenosis.

L5-S1: Unchanged right eccentric circumferential disc osteophyte
complex and mild bilateral facet arthropathy. Unchanged mild
bilateral neuroforaminal stenosis. No spinal canal stenosis.
IMPRESSION: 1. Progressive adjacent segment disease at L3-L4 with now severe
spinal canal stenosis.
2. Prior L4-L5 TLIF with mild subsidence of the interbody graft and
minimal bridging bone across the disc space.
3. Unchanged mild-to-moderate spinal canal stenosis at L2-L3.

## 2024-04-17 ENCOUNTER — Telehealth: Payer: Self-pay | Admitting: Cardiology

## 2024-04-17 NOTE — Telephone Encounter (Signed)
 Spoke with pt about Dr. Denver suggestions. Pt scheduled to see DOD Dr. Mona on 7/22. Pt aware. Pt verbalized understanding. All questions if any were answered.

## 2024-04-17 NOTE — Telephone Encounter (Signed)
 STAT if HR is under 50 or over 120  (normal HR is 60-100 beats per minute)  What is your heart rate? 185 (never goes above 150 while riding his bike)  Do you have a log of your heart rate readings (document readings)?   Do you have any other symptoms? Dizzy, nauseous, and felt like he could pass out.

## 2024-04-17 NOTE — Telephone Encounter (Signed)
 Spoke to patient he reports that he was biking yesterday and after stopping he started feeling dizzy, nauseous, and had a near syncopal episode. Pt admits that he looked at his apple and tried to get a EKG but it would only result as poor recording. Pt admits that he has a history of A. Fib. Pt reports that he has not been hydrating appropriately. Pt admits that he had to sit down and rest. This morning he is feeling better with heart rate at 63 and apple watch report sinus. Pt states that he has not been using his able watch and he is unsure if he has been in A. Fib.

## 2024-04-18 ENCOUNTER — Encounter: Payer: Self-pay | Admitting: Cardiology

## 2024-04-21 ENCOUNTER — Encounter: Payer: Self-pay | Admitting: Internal Medicine

## 2024-04-21 ENCOUNTER — Ambulatory Visit: Attending: Internal Medicine | Admitting: Internal Medicine

## 2024-04-21 VITALS — BP 162/78 | HR 58 | Ht 70.0 in | Wt 172.0 lb

## 2024-04-21 DIAGNOSIS — R55 Syncope and collapse: Secondary | ICD-10-CM | POA: Diagnosis not present

## 2024-04-21 DIAGNOSIS — Z9889 Other specified postprocedural states: Secondary | ICD-10-CM | POA: Diagnosis not present

## 2024-04-21 DIAGNOSIS — Z8679 Personal history of other diseases of the circulatory system: Secondary | ICD-10-CM

## 2024-04-21 DIAGNOSIS — I480 Paroxysmal atrial fibrillation: Secondary | ICD-10-CM | POA: Diagnosis not present

## 2024-04-21 DIAGNOSIS — I479 Paroxysmal tachycardia, unspecified: Secondary | ICD-10-CM | POA: Diagnosis not present

## 2024-04-21 NOTE — Patient Instructions (Addendum)
 Medication Instructions:  Your physician recommends that you continue on your current medications as directed. Please refer to the Current Medication list given to you today.  *If you need a refill on your cardiac medications before your next appointment, please call your pharmacy*  Follow-Up: At [facility], you and your health needs are our priority.  As part of our continuing mission to provide you with exceptional heart care, our providers are all part of one team.  This team includes your primary Cardiologist (physician) and Advanced Practice Providers or APPs (Physician Assistants and Nurse Practitioners) who all work together to provide you with the care you need, when you need it.  Your next appointment:   Dr. Lavona as needed (recall for November 2025)

## 2024-04-21 NOTE — Progress Notes (Signed)
 OFFICE NOTE  Chief Complaint:  Tachycardia, near syncope  Primary Care Physician: Shayne Anes, MD  HPI:  Nathaniel Bush is a 73 y.o. male with a past medial history significant for tachycardia and near syncope.  This is a pleasant 73 year old male patient followed by Dr. Lavona with a history of atrial flutter and prior ablation by Dr. Inocencio several years ago without recurrence.  He has also had palpitations and depression.  He is an avid cyclist and reportedly had called in after having an episode that came about after finishing a ride.  He was sitting outside cooling down when he noted that his chair was bent and he was using some force to press on the chair to get it to straighten out.  At that time he noted that he felt dizzy, he felt like his heart was racing and that his arms were tingling.  He noted on his Apple Watch that his heart rate was in the 180s to 190s.  He tried to capture an EKG but there was significant artifact.  He felt like he might pass out.  He noted that his urine was dark and he thought he might be somewhat dehydrated.  Ultimately the symptoms resolved spontaneously.  He has not had any recurrence recently.  Due to bradycardia at rest he is not on any AV nodal blocking agents.  He is also not currently on any anticoagulation.  PMHx:  Past Medical History:  Diagnosis Date   Arthritis    Depression    Dysrhythmia    aflutter   PAF (paroxysmal atrial fibrillation) (HCC)    Palpitations     Past Surgical History:  Procedure Laterality Date   A-FLUTTER ABLATION N/A 08/14/2019   Procedure: A-FLUTTER ABLATION;  Surgeon: Inocencio Soyla Lunger, MD;  Location: [facility];  Service: Cardiovascular;  Laterality: N/A;   INGUINAL HERNIA REPAIR     TOE SURGERY      FAMHx:  Family History  Problem Relation Age of Onset   Atrial fibrillation Father        pacemaker   Heart disease Paternal Grandmother        pacemaker    SOCHx:   reports that he has  never smoked. He has never used smokeless tobacco. He reports current alcohol use of about 3.0 standard drinks of alcohol per week. He reports that he does not use drugs.  ALLERGIES:  No Known Allergies  ROS: Pertinent items noted in HPI and remainder of comprehensive ROS otherwise negative.  HOME MEDS: Current Outpatient Medications on File Prior to Visit  Medication Sig Dispense Refill   acetaminophen  (TYLENOL ) 500 MG tablet Take 1,000 mg by mouth every 6 (six) hours as needed (pain.).     COVID-19 mRNA vaccine 2023-2024 (COMIRNATY ) syringe Inject into the muscle. 0.3 mL 0   COVID-19 mRNA vaccine 2023-2024 (COMIRNATY ) syringe Inject 0.3 mLs into the muscle. 0.3 mL 0   cyclobenzaprine  (FLEXERIL ) 5 MG tablet Take 1 tablet (5 mg total) by mouth 3 (three) times daily as needed for muscle spasms. 30 tablet 0   docusate sodium  (COLACE) 100 MG capsule Take 1 capsule (100 mg total) by mouth 2 (two) times daily. 60 capsule 0   gabapentin  (NEURONTIN ) 100 MG capsule 1 twice daily for 3 days then 2 twice daily for 3 days then 3 twice daily. 90 capsule 0   lithium  carbonate (ESKALITH ) 450 MG ER tablet Take 1 tablet (450 mg total) by mouth daily. 90 tablet 0  lithium  carbonate (LITHOBID ) 300 MG ER tablet Take 1 tablet (300 mg total) by mouth daily at 12 noon. 90 tablet 0   LORazepam  (ATIVAN ) 0.5 MG tablet Take 1 tablet (0.5 mg total) by mouth 2 (two) times daily as needed for anxiety. 30 tablet 1   No current facility-administered medications on file prior to visit.    LABS/IMAGING: No results found for this or any previous visit (from the past 48 hours). No results found.  LIPID PANEL:    Component Value Date/Time   CHOL 153 03/04/2020 0856   TRIG 69 03/04/2020 0856   HDL 63 03/04/2020 0856   CHOLHDL 2.4 03/04/2020 0856   LDLCALC 76 03/04/2020 0856     WEIGHTS: Wt Readings from Last 3 Encounters:  04/21/24 172 lb (78 kg)  09/11/23 172 lb 12.8 oz (78.4 kg)  08/23/23 173 lb (78.5 kg)     VITALS: BP (!) 162/78   Pulse (!) 58   Ht 5' 10 (1.778 m)   Wt 172 lb (78 kg)   SpO2 96%   BMI 24.68 kg/m   EXAM: General appearance: alert and no distress Lungs: clear to auscultation bilaterally Heart: regular rate and rhythm, S1, S2 normal, no murmur, click, rub or gallop Extremities: extremities normal, atraumatic, no cyanosis or edema Neurologic: Grossly normal  EKG: EKG Interpretation Date/Time:  Tuesday April 21 2024 15:33:50 EDT Ventricular Rate:  55 PR Interval:  236 QRS Duration:  108 QT Interval:  426 QTC Calculation: 407 R Axis:   -45  Text Interpretation: Sinus bradycardia with sinus arrhythmia with 1st degree A-V block Left anterior fascicular block When compared with ECG of 23-Aug-2023 10:23, Fusion complexes are no longer Present Premature ventricular complexes are no longer Present Premature supraventricular complexes are no longer Present Nonspecific T wave abnormality, improved in Lateral leads Confirmed by Mona Kent [phone]) on 04/21/2024 3:51:11 PM - personally reviewed  ASSESSMENT: Tachycardia and presyncope History of atrial flutter  PLAN: 1.   Mr. Nathaniel Bush had an episode of tachycardia and was near syncopal.  This may have been an SVT or atrial fibrillation.  The heart rate was quite fast, much higher than I would suspect for a sinus rhythm.  He had already finished exercising and was in a cooldown period.  The difficulty is that even monitoring may not identify recurrent rhythms.  His best bet is to try to again monitor and capture the rhythm on his Apple Watch.  This episode lasted only few minutes and therefore even if it was atrial fibrillation would not likely be a risk factor for stroke unless he was having it more regularly.  He is advised to stay well-hydrated with his exercise.  Also, his blood pressure was noted to be elevated today however he says his home blood pressure readings are better controlled.  He did indicate that he would  consider going back on statin therapy since his cholesterol has been elevated but he has been hesitant to be on statin therapy.  Recommend follow-up with Dr. Lavona and Dr. Inocencio as needed.  Kent KYM Mona, MD, [facility], FNLA, FACP  [place]  [facility]  Medical Director of the [facility]  [facility] Diplomate of the American Board of Clinical Lipidology Attending Cardiologist  Direct Dial: [phone]  Fax: [phone]  Website:  [email or web address]   Kent BROCKS Devin Foskey 04/21/2024, 9:25 PM

## 2024-04-22 ENCOUNTER — Ambulatory Visit: Admitting: Psychiatry

## 2024-04-30 ENCOUNTER — Other Ambulatory Visit: Payer: Self-pay | Admitting: Psychiatry

## 2024-04-30 DIAGNOSIS — F4310 Post-traumatic stress disorder, unspecified: Secondary | ICD-10-CM

## 2024-04-30 DIAGNOSIS — F319 Bipolar disorder, unspecified: Secondary | ICD-10-CM

## 2024-04-30 DIAGNOSIS — F411 Generalized anxiety disorder: Secondary | ICD-10-CM

## 2024-06-04 ENCOUNTER — Encounter: Payer: Self-pay | Admitting: Cardiology

## 2024-06-04 DIAGNOSIS — E785 Hyperlipidemia, unspecified: Secondary | ICD-10-CM

## 2024-06-04 NOTE — Addendum Note (Signed)
 Addended by: Solyana Nonaka N on: 06/04/2024 05:35 PM   Modules accepted: Orders

## 2024-06-04 NOTE — Telephone Encounter (Signed)
 Left voice message to call back 9/4

## 2024-06-05 ENCOUNTER — Other Ambulatory Visit: Payer: Self-pay | Admitting: Psychiatry

## 2024-06-05 DIAGNOSIS — F319 Bipolar disorder, unspecified: Secondary | ICD-10-CM

## 2024-06-08 ENCOUNTER — Other Ambulatory Visit: Payer: Self-pay

## 2024-06-08 ENCOUNTER — Ambulatory Visit: Attending: Student | Admitting: Physical Therapy

## 2024-06-08 DIAGNOSIS — M6281 Muscle weakness (generalized): Secondary | ICD-10-CM | POA: Insufficient documentation

## 2024-06-08 DIAGNOSIS — M25651 Stiffness of right hip, not elsewhere classified: Secondary | ICD-10-CM | POA: Diagnosis present

## 2024-06-08 DIAGNOSIS — M5459 Other low back pain: Secondary | ICD-10-CM | POA: Diagnosis present

## 2024-06-08 NOTE — Therapy (Signed)
 OUTPATIENT PHYSICAL THERAPY THORACOLUMBAR EVALUATION   Patient Name: Nathaniel Bush MRN: [id number] DOB:07-Oct-1950, 73 y.o., male Today's Date: 06/08/2024  END OF SESSION:  PT End of Session - 06/08/24 1237     Visit Number 1    Number of Visits 7    Date for PT Re-Evaluation 07/20/24    PT Start Time 1045    PT Stop Time 1125    PT Time Calculation (min) 40 min    Activity Tolerance Patient tolerated treatment well    Behavior During Therapy WFL for tasks assessed/performed          Past Medical History:  Diagnosis Date   Arthritis    Depression    Dysrhythmia    aflutter   PAF (paroxysmal atrial fibrillation) (HCC)    Palpitations    Past Surgical History:  Procedure Laterality Date   A-FLUTTER ABLATION N/A 08/14/2019   Procedure: A-FLUTTER ABLATION;  Surgeon: Inocencio Soyla Lunger, MD;  Location: [facility];  Service: Cardiovascular;  Laterality: N/A;   INGUINAL HERNIA REPAIR     TOE SURGERY     Patient Active Problem List   Diagnosis Date Noted   Spinal stenosis of lumbar region with neurogenic claudication 04/05/2022   Elevated coronary artery calcium score 03/07/2020   Dyslipidemia 03/06/2020   Spondylolisthesis of lumbar region 09/30/2019   Preop cardiovascular exam 09/14/2019   Educated about COVID-19 virus infection 09/14/2019   Atrial flutter (HCC) 07/07/2019   History of depression 07/07/2019   PAF (paroxysmal atrial fibrillation) (HCC) 05/15/2018   Dizziness 02/07/2018   Palpitations 02/07/2018   Bradycardia 01/28/2015    PCP: Shayne Anes, MD  REFERRING PROVIDER: Jennetta Gerard BIRCH, NP  REFERRING DIAG: MRN [phone]  Rationale for Evaluation and Treatment: Rehabilitation  THERAPY DIAG:  Muscle weakness (generalized)  Stiffness of right hip, not elsewhere classified  Other low back pain  PERTINENT HISTORY: Bradycardia  WEIGHT BEARING RESTRICTIONS: No  FALLS:  Has patient fallen in last 6 months? Yes, tripped going up the  stairs yesterday   LIVING ENVIRONMENT: Lives with: lives with their family Lives in: House/apartment Stairs: Yes: Internal: 12 steps; can reach both Has following equipment at home: None  OCCUPATION: Designer, jewellery   PRECAUTIONS: None ---------------------------------------------------------------------------------------------  SUBJECTIVE:                                                                                                                                                                                           SUBJECTIVE STATEMENT: Eval statement 06/08/2024: back pain gradually onset throughout lifetime. Imaging shows disc degeneration and proceeded with a lumbar fusion at 2 levels. Has a  small isolated spot in the back that is separate from the fusion. Sitting occasionally hurts more depending on the chair.  Pain: 5/10 at worst, no n/t, no swelling  RED FLAGS: None    PLOF: Independent  PATIENT GOALS: help the pain  NEXT MD VISIT: need to schedule ---------------------------------------------------------------------------------------------  OBJECTIVE:  Note: Objective measures were completed at Evaluation unless otherwise noted.  DIAGNOSTIC FINDINGS:  IMPRESSION: 1. Status post interval fusion at L3-L4, with unchanged mild right neural foraminal narrowing. No the dual spinal canal stenosis. 2. Unchanged mild-to-moderate spinal canal stenosis at L2-L3. 3. Unchanged mild bilateral neural foraminal narrowing at L5-S1.     Electronically Signed   By: Donald Campion M.D.   On: 03/27/2023 16:56  PATIENT SURVEYS:  MODI:11/50 (22%)  COGNITION: Overall cognitive status: Within functional limits for tasks assessed   PALPATION: Tenderness to R SIJ and lumbar paraspinals  Lumbar contraction pattern  L Multifidus: good quality  R Multifidus: Good quality   SENSATION: WFL  MUSCLE LENGTH: Hamstrings: Right 15; deg; Left 15 deg  Thomas test:  WFL  POSTURE: right pelvic obliquity   LUMBAR ROM:   AROM eval  Flexion 90%  Extension 70%  Right lateral flexion 70%  Left lateral flexion 70%  Right rotation 70%  Left rotation 70%   (Blank rows = not tested)  ! Indicates pain with testing  LOWER EXTREMITY ROM:     Active  Right eval Left eval  Hip flexion 110d WFL  Hip extension [facility] Dt  Hip abduction    Hip adduction    Hip internal rotation    Hip external rotation 20d WFL  Knee flexion    Knee extension    Ankle dorsiflexion    Ankle plantarflexion    Ankle inversion    Ankle eversion     (Blank rows = not tested)  ! Indicates pain with testing  LOWER EXTREMITY MMT:    MMT Right eval Left eval  Hip flexion 4- 4+  Hip extension 4 4  Hip abduction    Hip adduction    Hip internal rotation    Hip external rotation    Knee flexion    Knee extension    Ankle dorsiflexion    Ankle plantarflexion    Ankle inversion    Ankle eversion     (Blank rows = not tested)   ! Indicates pain with testing LUMBAR SPECIAL TESTS:  Prone instability test: Negative, SI Compression/distraction test: Positive, FABER test: Positive, and Gaenslen's test: Positive  GAIT: Distance walked: 16ft Assistive device utilized: None Level of assistance: Complete Independence Comments: [facility]  [facility] Adult PT Treatment:                                                DATE: 06/08/2024 Self Care: Pt education POC discussion  PATIENT EDUCATION:  Education details: Pt received education regarding HEP performance, ADL performance, functional activity tolerance, impairment education, appropriate performance of therapeutic activities.  Person educated: Patient Education method: Explanation, Demonstration, Tactile cues, Verbal cues, and Handouts Education comprehension: verbalized understanding and returned  demonstration  HOME EXERCISE PROGRAM: Access Code: [email or web address] URL: [email or web address] Date: 06/08/2024 Prepared by: Mabel Kiang  Exercises - 90/90 SI Joint Self-Correction with Dowel  - 1 x daily - 2 x weekly - 1 sets - 5 reps - 8s hold - Hooklying SI Joint Self-Correction  - 1 x daily - 5 x weekly - 2 sets - 12 reps - 3s hold - Hooklying Isometric Hip Abduction Adduction with Belt and Ball  - 1 x daily - 5 x weekly - 2 sets - 12 reps - 3s hold ---------------------------------------------------------------------------------------------  ASSESSMENT:  CLINICAL IMPRESSION: Eval impression (06/08/2024): Pt. attended today's physical therapy session for evaluation of low back pain. Pt has complaints of pinpoint pain in R lower back separate from previous lumbar dysfunction leading to 2 level fusion at L3/L4. Pt has notable deficits with a R posterior pelvic obliquity, global hip strength, core strength, and core contraction patterns.  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 Pt would benefit from therapeutic focus on R SIJ manipulation with P>A focus, lumbo pelvic stabilization, and dynamic core strengthening/contraction pattern practice.  Treatment performed today focused on pt education detailed in the objective. Pt demonstrated good understanding of education provided. required minimal v/t cues and no assistance for appropriate performance with today's activities. Pt requires the intervention of skilled outpatient physical therapy to address the aforementioned deficits and progress towards a functional level in line with therapeutic goals.    OBJECTIVE IMPAIRMENTS: decreased activity tolerance, decreased mobility, difficulty walking, decreased ROM, decreased strength, improper body mechanics, postural dysfunction, and pain.   ACTIVITY LIMITATIONS: sitting, squatting, stairs, locomotion level, and caring for others  PARTICIPATION LIMITATIONS: driving,  shopping, community activity, and occupation  PERSONAL FACTORS: Age, Fitness, Past/current experiences, Time since onset of injury/illness/exacerbation, and 1 comorbidity: lumbar fusion are also affecting patient's functional outcome.   REHAB POTENTIAL: Good  CLINICAL DECISION MAKING: Stable/uncomplicated  EVALUATION COMPLEXITY: Low   GOALS: Goals reviewed with patient? YES  SHORT TERM GOALS: Target date: 06/29/2024  Pt will be independent with administered HEP to demonstrate the competency necessary for long term managemnet of symptoms at home. Baseline: Goal status: INITIAL  LONG TERM GOALS: Target date: 07/20/2024  Pt. Will achieve a MODI score of 5/50 (10%) as to demonstrate improvement in self-perceived functional ability with daily activities.  Baseline: 11/50 (22%) Goal status: INITIAL  2.  Pt will improve Global hip strength to a 4+/5 to demonstrate improvement in strength for quality of motion and activity performance. Baseline: see obj chart Goal status: INITIAL  3.  Pt will improve Lumbar AROM to 90% of standardized norms with less than 2/10 pain to demonstrate necessary mobility for high quality and safe ADLs  Baseline:  Goal status: INITIAL  4.  Pt will report pain levels improving during recreational to be less than or equal to 2/10 as to demonstrate improved tolerance with daily functional activities such as squatting and walking.  Baseline:  Goal status: INITIAL  ---------------------------------------------------------------------------------------------  PLAN:  PT FREQUENCY: 1-2x/week  PT DURATION: 6 weeks  PLANNED INTERVENTIONS: 97110-Therapeutic exercises, 97530- Therapeutic activity, [id number]- Neuromuscular re-education, 97535- Self Care, [id number]- Manual therapy, [phone]- Gait training, Patient/Family education, Stair training, Taping, Joint mobilization, Spinal mobilization, DME instructions, Cryotherapy, and Moist heat.  PLAN FOR NEXT SESSION: Review  HEP, Begin POC as detailed in assessment   Date of referral: 05/22/2024 Referring provider: Jennetta Gerard BIRCH, NP Referring diagnosis? MRN #[id number] Treatment diagnosis? (if different than referring diagnosis) R SIJ dysfunction  What was this (referring dx) caused by? Surgery (Type: Lumbar Fusion)  Nature of Condition: Initial Onset (within last 3 months)   Laterality: Both  Current Functional Measure Score: Back Index 11/50 (22%)  Objective measurements identify impairments when they are compared to normal values, the uninvolved extremity, and prior level of function.  [x]  Yes  []  No  Objective assessment of functional ability: Minimal functional limitations   Briefly describe symptoms: pinpoint pain in lumbar spine  How did symptoms start: ongoing, no moi  Average pain intensity:  Last 24 hours: 2/10  Past week: 5/10  How often does the pt experience symptoms? Frequently  How much have the symptoms interfered with usual daily activities? Moderately  How has condition changed since care began at this facility? NA - initial visit  In general, how is the patients overall health? Good   BACK PAIN (STarT Back Screening Tool) Has pain spread down the leg(s) at some time in the last 2 weeks? n Has there been pain in the shoulder or neck at some time in the last 2 weeks? n Has the pt only walked short distances because of back pain? y Has patient dressed more slowly because of back pain in the past 2 weeks? y Does patient think it's not safe for a person with this condition to be physically active? y Does patient have worrying thoughts a lot of the time? y Does patient feel back pain is terrible and will never get any better? n Has patient stopped enjoying things they usually enjoy? y   Mabel Kiang, PT, DPT 06/08/2024, 12:38 PM

## 2024-06-16 ENCOUNTER — Other Ambulatory Visit (HOSPITAL_BASED_OUTPATIENT_CLINIC_OR_DEPARTMENT_OTHER): Payer: Self-pay

## 2024-06-16 MED ORDER — COMIRNATY 30 MCG/0.3ML IM SUSY
0.3000 mL | PREFILLED_SYRINGE | Freq: Once | INTRAMUSCULAR | 0 refills | Status: AC
  Filled 2024-06-16: qty 0.3, 1d supply, fill #0

## 2024-06-16 MED ORDER — FLUZONE HIGH-DOSE 0.5 ML IM SUSY
0.5000 mL | PREFILLED_SYRINGE | Freq: Once | INTRAMUSCULAR | 0 refills | Status: AC
  Filled 2024-06-16: qty 0.5, 1d supply, fill #0

## 2024-06-18 ENCOUNTER — Other Ambulatory Visit: Payer: Self-pay | Admitting: Psychiatry

## 2024-06-18 ENCOUNTER — Ambulatory Visit: Admitting: Physical Therapy

## 2024-06-18 ENCOUNTER — Encounter: Payer: Self-pay | Admitting: Physical Therapy

## 2024-06-18 DIAGNOSIS — M25651 Stiffness of right hip, not elsewhere classified: Secondary | ICD-10-CM

## 2024-06-18 DIAGNOSIS — M5459 Other low back pain: Secondary | ICD-10-CM

## 2024-06-18 DIAGNOSIS — M6281 Muscle weakness (generalized): Secondary | ICD-10-CM

## 2024-06-18 DIAGNOSIS — F411 Generalized anxiety disorder: Secondary | ICD-10-CM

## 2024-06-18 NOTE — Therapy (Addendum)
 OUTPATIENT PHYSICAL THERAPY THORACOLUMBAR  PHYSICAL THERAPY DISCHARGE SUMMARY  Visits from Start of Care: 2  Current functional level related to goals / functional outcomes: See goals   Remaining deficits: Current status unknown   Education / Equipment: HEP   Patient agrees to discharge. Patient goals were not met. Patient is being discharged due to not returning since the last visit.  Joneen Fresh PT, DPT, LAT, ATC  07/31/24  7:50 AM    Patient Name: Nathaniel Bush MRN: [id number] DOB:[birth year], 73 y.o., male Today's Date: 06/18/2024  END OF SESSION:  PT End of Session - 06/18/24 1044     Visit Number 2    Number of Visits 7    Date for Recertification  07/20/24    PT Start Time 1045    PT Stop Time 1125    PT Time Calculation (min) 40 min    Activity Tolerance Patient tolerated treatment well    Behavior During Therapy WFL for tasks assessed/performed          Past Medical History:  Diagnosis Date   Arthritis    Depression    Dysrhythmia    aflutter   PAF (paroxysmal atrial fibrillation) (HCC)    Palpitations    Past Surgical History:  Procedure Laterality Date   A-FLUTTER ABLATION N/A 08/14/2019   Procedure: A-FLUTTER ABLATION;  Surgeon: Inocencio Soyla Lunger, MD;  Location: [facility];  Service: Cardiovascular;  Laterality: N/A;   INGUINAL HERNIA REPAIR     TOE SURGERY     Patient Active Problem List   Diagnosis Date Noted   Spinal stenosis of lumbar region with neurogenic claudication 04/05/2022   Elevated coronary artery calcium score 03/07/2020   Dyslipidemia 03/06/2020   Spondylolisthesis of lumbar region 09/30/2019   Preop cardiovascular exam 09/14/2019   Educated about COVID-19 virus infection 09/14/2019   Atrial flutter (HCC) 07/07/2019   History of depression 07/07/2019   PAF (paroxysmal atrial fibrillation) (HCC) 05/15/2018   Dizziness 02/07/2018   Palpitations 02/07/2018   Bradycardia 01/28/2015    PCP: Shayne Anes,  MD  REFERRING PROVIDER: Jennetta Gerard BIRCH, NP  REFERRING DIAG: MRN [id number]  Rationale for Evaluation and Treatment: Rehabilitation  THERAPY DIAG:  Muscle weakness (generalized)  Other low back pain  Stiffness of right hip, not elsewhere classified  PERTINENT HISTORY: Bradycardia  WEIGHT BEARING RESTRICTIONS: No  FALLS:  Has patient fallen in last 6 months? Yes, tripped going up the stairs yesterday   LIVING ENVIRONMENT: Lives with: lives with their family Lives in: House/apartment Stairs: Yes: Internal: 12 steps; can reach both Has following equipment at home: None  OCCUPATION: designer, jewellery   PRECAUTIONS: None ---------------------------------------------------------------------------------------------  SUBJECTIVE:  SUBJECTIVE STATEMENT: Pt attended today's session with reports of 2/10 pain. Pt stated that they have maintained good compliance with current HEP.  Pain in SIJ has stopped, pain now is in the lumbar muscles   Eval statement 06/08/2024: back pain gradually onset throughout lifetime. Imaging shows disc degeneration and proceeded with a lumbar fusion at 2 levels. Has a small isolated spot in the back that is separate from the fusion. Sitting occasionally hurts more depending on the chair.  Pain: 5/10 at worst, no n/t, no swelling  RED FLAGS: None    PLOF: Independent  PATIENT GOALS: help the pain  NEXT MD VISIT: need to schedule ---------------------------------------------------------------------------------------------  OBJECTIVE:  Note: Objective measures were completed at Evaluation unless otherwise noted.  DIAGNOSTIC FINDINGS:  IMPRESSION: 1. Status post interval fusion at L3-L4, with unchanged mild right neural foraminal narrowing. No the dual spinal  canal stenosis. 2. Unchanged mild-to-moderate spinal canal stenosis at L2-L3. 3. Unchanged mild bilateral neural foraminal narrowing at L5-S1.     Electronically Signed   By: Donald Campion M.D.   On: 03/27/2023 16:56  PATIENT SURVEYS:  MODI:11/50 (22%)  COGNITION: Overall cognitive status: Within functional limits for tasks assessed   PALPATION: Tenderness to R SIJ and lumbar paraspinals  Lumbar contraction pattern  L Multifidus: good quality  R Multifidus: Good quality   SENSATION: WFL  MUSCLE LENGTH: Hamstrings: Right 15; deg; Left 15 deg  Thomas test: WFL  POSTURE: right pelvic obliquity   LUMBAR ROM:   AROM eval  Flexion 90%  Extension 70%  Right lateral flexion 70%  Left lateral flexion 70%  Right rotation 70%  Left rotation 70%   (Blank rows = not tested)  ! Indicates pain with testing  LOWER EXTREMITY ROM:     Active  Right eval Left eval  Hip flexion 110d WFL  Hip extension [facility]  Hip abduction    Hip adduction    Hip internal rotation    Hip external rotation 20d WFL  Knee flexion    Knee extension    Ankle dorsiflexion    Ankle plantarflexion    Ankle inversion    Ankle eversion     (Blank rows = not tested)  ! Indicates pain with testing  LOWER EXTREMITY MMT:    MMT Right eval Left eval  Hip flexion 4- 4+  Hip extension 4 4  Hip abduction    Hip adduction    Hip internal rotation    Hip external rotation    Knee flexion    Knee extension    Ankle dorsiflexion    Ankle plantarflexion    Ankle inversion    Ankle eversion     (Blank rows = not tested)   ! Indicates pain with testing LUMBAR SPECIAL TESTS:  Prone instability test: Negative, SI Compression/distraction test: Positive, FABER test: Positive, and Gaenslen's test: Positive  GAIT: Distance walked: 127ft Assistive device utilized: None Level of assistance: Complete Independence Comments: WFL  TREATMENT:  [facility] Adult PT Treatment:                                                 DATE: 06/18/2024  Therapeutic Exercise: Supine QL stretch 2x1' P/A mobilization of approx L3-L5 and sacrum  Therapeutic Activity: NuStep 8' for activity tolerance  Supine bent knee fall out  2x12B, hold 2s Supine 90/90 hold  2x45s Supine Pball roll up  (d/c d/t onset of reported vertigo, symptoms relieved with rest) 1x15, hold 2s Pt education on anatomy of lumbar spine/musculature, kinetic chain coordination between sacrum and lumbar spine.   [facility] Adult PT Treatment:                                                DATE: 06/08/2024 Self Care: Pt education POC discussion                                                                                                                                PATIENT EDUCATION:  Education details: Pt received education regarding HEP performance, ADL performance, functional activity tolerance, impairment education, appropriate performance of therapeutic activities.  Person educated: Patient Education method: Explanation, Demonstration, Tactile cues, Verbal cues, and Handouts Education comprehension: verbalized understanding and returned demonstration  HOME EXERCISE PROGRAM: Access Code: [email or web address] URL: [email or web address] Date: 06/08/2024 Prepared by: Mabel Kiang  Exercises - 90/90 SI Joint Self-Correction with Dowel  - 1 x daily - 2 x weekly - 1 sets - 5 reps - 8s hold - Hooklying SI Joint Self-Correction  - 1 x daily - 5 x weekly - 2 sets - 12 reps - 3s hold - Hooklying Isometric Hip Abduction Adduction with Belt and Ball  - 1 x daily - 5 x weekly - 2 sets - 12 reps - 3s hold ---------------------------------------------------------------------------------------------  ASSESSMENT:  CLINICAL IMPRESSION: Pt attended physical therapy session for continuation of treatment regarding LBP. Today's treatment focused on improvement of  proximal hip strength, posterior chain motility, and lumbar stability. Pt  attended with resolution of SIJ pain, however onset of lumbar muscle pain which seems to stem from QL.  Pt showed great tolerance to administered treatment with minimal adverse effects by the end of session. Skilled intervention was utilized via activity modification for pt tolerance with task completion, functional progression/regression promoting best outcomes inline with current rehab goals, as well as minimal verbal/tactile cuing alongside no physical assistance for safe and appropriate performance of today's activities. Continue with therapeutic focus on current POC outline.   Eval impression (06/08/2024): Pt. attended today's physical therapy session for evaluation of low back pain. Pt has complaints of pinpoint pain in R lower back separate from previous lumbar dysfunction leading to 2 level fusion at L3/L4. Pt has notable deficits with a R posterior pelvic obliquity, global hip strength, core strength, and core contraction patterns.  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 Pt would benefit from therapeutic focus on R SIJ manipulation with P>A focus, lumbo pelvic stabilization, and dynamic core strengthening/contraction pattern practice.  Treatment performed today focused on pt education detailed in the objective. Pt demonstrated good understanding of education provided. required minimal v/t cues and no assistance for appropriate performance with today's activities.  Pt requires the intervention of skilled outpatient physical therapy to address the aforementioned deficits and progress towards a functional level in line with therapeutic goals.    OBJECTIVE IMPAIRMENTS: decreased activity tolerance, decreased mobility, difficulty walking, decreased ROM, decreased strength, improper body mechanics, postural dysfunction, and pain.   ACTIVITY LIMITATIONS: sitting, squatting, stairs, locomotion level, and caring for others  PARTICIPATION LIMITATIONS: driving, shopping, community activity,  and occupation  PERSONAL FACTORS: Age, Fitness, Past/current experiences, Time since onset of injury/illness/exacerbation, and 1 comorbidity: lumbar fusion are also affecting patient's functional outcome.   REHAB POTENTIAL: Good  CLINICAL DECISION MAKING: Stable/uncomplicated  EVALUATION COMPLEXITY: Low   GOALS: Goals reviewed with patient? YES  SHORT TERM GOALS: Target date: 06/29/2024  Pt will be independent with administered HEP to demonstrate the competency necessary for long term managemnet of symptoms at home. Baseline: Goal status: INITIAL  LONG TERM GOALS: Target date: 07/20/2024  Pt. Will achieve a MODI score of 5/50 (10%) as to demonstrate improvement in self-perceived functional ability with daily activities.  Baseline: 11/50 (22%) Goal status: INITIAL  2.  Pt will improve Global hip strength to a 4+/5 to demonstrate improvement in strength for quality of motion and activity performance. Baseline: see obj chart Goal status: INITIAL  3.  Pt will improve Lumbar AROM to 90% of standardized norms with less than 2/10 pain to demonstrate necessary mobility for high quality and safe ADLs  Baseline:  Goal status: INITIAL  4.  Pt will report pain levels improving during recreational to be less than or equal to 2/10 as to demonstrate improved tolerance with daily functional activities such as squatting and walking.  Baseline:  Goal status: INITIAL  ---------------------------------------------------------------------------------------------  PLAN:  PT FREQUENCY: 1-2x/week  PT DURATION: 6 weeks  PLANNED INTERVENTIONS: 97110-Therapeutic exercises, 97530- Therapeutic activity, [id number]- Neuromuscular re-education, 97535- Self Care, [id number]- Manual therapy, [phone]- Gait training, Patient/Family education, Stair training, Taping, Joint mobilization, Spinal mobilization, DME instructions, Cryotherapy, and Moist heat.  PLAN FOR NEXT SESSION: Review HEP, Begin POC as detailed in  assessment   Date of referral: 05/22/2024 Referring provider: Jennetta Gerard BIRCH, NP Referring diagnosis? MRN #[id number] Treatment diagnosis? (if different than referring diagnosis) R SIJ dysfunction  What was this (referring dx) caused by? Surgery (Type: Lumbar Fusion)  Nature of Condition: Initial Onset (within last 3 months)   Laterality: Both  Current Functional Measure Score: Back Index 11/50 (22%)  Objective measurements identify impairments when they are compared to normal values, the uninvolved extremity, and prior level of function.  [x]  Yes  []  No  Objective assessment of functional ability: Minimal functional limitations   Briefly describe symptoms: pinpoint pain in lumbar spine  How did symptoms start: ongoing, no moi  Average pain intensity:  Last 24 hours: 2/10  Past week: 5/10  How often does the pt experience symptoms? Frequently  How much have the symptoms interfered with usual daily activities? Moderately  How has condition changed since care began at this facility? NA - initial visit  In general, how is the patients overall health? Good   BACK PAIN (STarT Back Screening Tool) Has pain spread down the leg(s) at some time in the last 2 weeks? n Has there been pain in the shoulder or neck at some time in the last 2 weeks? n Has the pt only walked short distances because of back pain? y Has patient dressed more slowly because of back pain in the past 2 weeks? y Does patient think it's not safe for a person with  this condition to be physically active? y Does patient have worrying thoughts a lot of the time? y Does patient feel back pain is terrible and will never get any better? n Has patient stopped enjoying things they usually enjoy? y   Mabel Kiang, PT, DPT 06/18/2024, 11:25 AM

## 2024-06-18 NOTE — Telephone Encounter (Signed)
 Apparently he is indicated to the pharmacist that the lorazepam  does not work as well as alprazolam .  However the alprazolam  was making him feel sedated.  Ask him if he is having any side effects from the lorazepam .  If not and it is just less effective we could increase the dose.  The whole point of the lorazepam  was so that he would have less sedation.  As long as he is not being sedated by the lorazepam  we could increase the dose.  Let me know what he requests.

## 2024-06-19 ENCOUNTER — Telehealth: Payer: Self-pay | Admitting: Psychiatry

## 2024-06-19 NOTE — Telephone Encounter (Signed)
 Next appt is 08/03/24. Requesting refill on Alprazolam  called to:   [facility] [place], [place] - [address] [facility] [place]    Phone: [phone]  Fax: [phone]

## 2024-06-19 NOTE — Telephone Encounter (Signed)
 LVM to [facility]

## 2024-06-19 NOTE — Telephone Encounter (Signed)
 I have already talked to patient about this today and sent message to Dr. Geoffry.

## 2024-06-19 NOTE — Telephone Encounter (Signed)
 I don't want him to take lorazepam  with alprazolam  since he wants to return to daytime alprazolam .  Just take the alprazolam  if that is the one he prefers.

## 2024-06-19 NOTE — Therapy (Deleted)
 OUTPATIENT PHYSICAL THERAPY THORACOLUMBAR EVALUATION   Patient Name: Nathaniel Bush MRN: [id number] DOB:[birth year], 73 y.o., male Today's Date: 06/19/2024  END OF SESSION:    Past Medical History:  Diagnosis Date   Arthritis    Depression    Dysrhythmia    aflutter   PAF (paroxysmal atrial fibrillation) (HCC)    Palpitations    Past Surgical History:  Procedure Laterality Date   A-FLUTTER ABLATION N/A 08/14/2019   Procedure: A-FLUTTER ABLATION;  Surgeon: Inocencio Soyla Lunger, MD;  Location: [facility];  Service: Cardiovascular;  Laterality: N/A;   INGUINAL HERNIA REPAIR     TOE SURGERY     Patient Active Problem List   Diagnosis Date Noted   Spinal stenosis of lumbar region with neurogenic claudication 04/05/2022   Elevated coronary artery calcium score 03/07/2020   Dyslipidemia 03/06/2020   Spondylolisthesis of lumbar region 09/30/2019   Preop cardiovascular exam 09/14/2019   Educated about COVID-19 virus infection 09/14/2019   Atrial flutter (HCC) 07/07/2019   History of depression 07/07/2019   PAF (paroxysmal atrial fibrillation) (HCC) 05/15/2018   Dizziness 02/07/2018   Palpitations 02/07/2018   Bradycardia 01/28/2015    PCP: Shayne Anes, MD  REFERRING PROVIDER: Jennetta Gerard BIRCH, NP  REFERRING DIAG: MRN [id number]  Rationale for Evaluation and Treatment: Rehabilitation  THERAPY DIAG:  No diagnosis found.  PERTINENT HISTORY: Bradycardia  WEIGHT BEARING RESTRICTIONS: No  FALLS:  Has patient fallen in last 6 months? Yes, tripped going up the stairs yesterday   LIVING ENVIRONMENT: Lives with: lives with their family Lives in: House/apartment Stairs: Yes: Internal: 12 steps; can reach both Has following equipment at home: None  OCCUPATION: Designer, jewellery   PRECAUTIONS: None ---------------------------------------------------------------------------------------------  SUBJECTIVE:                                                                                                                                                                                            SUBJECTIVE STATEMENT: Pt attended today's session with reports of 2/10 pain. Pt stated that they have maintained good compliance with current HEP.  Pain in SIJ has stopped, pain now is in the lumbar muscles   Eval statement 06/08/2024: back pain gradually onset throughout lifetime. Imaging shows disc degeneration and proceeded with a lumbar fusion at 2 levels. Has a small isolated spot in the back that is separate from the fusion. Sitting occasionally hurts more depending on the chair.  Pain: 5/10 at worst, no n/t, no swelling  RED FLAGS: None    PLOF: Independent  PATIENT GOALS: help the pain  NEXT MD VISIT: need to schedule ---------------------------------------------------------------------------------------------  OBJECTIVE:  Note:  Objective measures were completed at Evaluation unless otherwise noted.  DIAGNOSTIC FINDINGS:  IMPRESSION: 1. Status post interval fusion at L3-L4, with unchanged mild right neural foraminal narrowing. No the dual spinal canal stenosis. 2. Unchanged mild-to-moderate spinal canal stenosis at L2-L3. 3. Unchanged mild bilateral neural foraminal narrowing at L5-S1.     Electronically Signed   By: Donald Campion M.D.   On: 03/27/2023 16:56  PATIENT SURVEYS:  MODI:11/50 (22%)  COGNITION: Overall cognitive status: Within functional limits for tasks assessed   PALPATION: Tenderness to R SIJ and lumbar paraspinals  Lumbar contraction pattern  L Multifidus: good quality  R Multifidus: Good quality   SENSATION: WFL  MUSCLE LENGTH: Hamstrings: Right 15; deg; Left 15 deg  Thomas test: WFL  POSTURE: right pelvic obliquity   LUMBAR ROM:   AROM eval  Flexion 90%  Extension 70%  Right lateral flexion 70%  Left lateral flexion 70%  Right rotation 70%  Left rotation 70%   (Blank rows = not tested)  !  Indicates pain with testing  LOWER EXTREMITY ROM:     Active  Right eval Left eval  Hip flexion 110d WFL  Hip extension [facility]  Hip abduction    Hip adduction    Hip internal rotation    Hip external rotation 20d WFL  Knee flexion    Knee extension    Ankle dorsiflexion    Ankle plantarflexion    Ankle inversion    Ankle eversion     (Blank rows = not tested)  ! Indicates pain with testing  LOWER EXTREMITY MMT:    MMT Right eval Left eval  Hip flexion 4- 4+  Hip extension 4 4  Hip abduction    Hip adduction    Hip internal rotation    Hip external rotation    Knee flexion    Knee extension    Ankle dorsiflexion    Ankle plantarflexion    Ankle inversion    Ankle eversion     (Blank rows = not tested)   ! Indicates pain with testing LUMBAR SPECIAL TESTS:  Prone instability test: Negative, SI Compression/distraction test: Positive, FABER test: Positive, and Gaenslen's test: Positive  GAIT: Distance walked: 175ft Assistive device utilized: None Level of assistance: Complete Independence Comments: WFL  TREATMENT:  [facility] Adult PT Treatment:                                                DATE: 06/18/2024  Therapeutic Exercise: Supine QL stretch 2x1' P/A mobilization of approx L3-L5 and sacrum  Therapeutic Activity: NuStep 8' for activity tolerance  Supine bent knee fall out  2x12B, hold 2s Supine 90/90 hold 2x45s Supine Pball roll up  (d/c d/t onset of reported vertigo, symptoms relieved with rest) 1x15, hold 2s Pt education on anatomy of lumbar spine/musculature, kinetic chain coordination between sacrum and lumbar spine.   [facility] Adult PT Treatment:                                                DATE: 06/08/2024 Self Care: Pt education POC discussion  PATIENT EDUCATION:  Education details: Pt received education regarding HEP  performance, ADL performance, functional activity tolerance, impairment education, appropriate performance of therapeutic activities.  Person educated: Patient Education method: Explanation, Demonstration, Tactile cues, Verbal cues, and Handouts Education comprehension: verbalized understanding and returned demonstration  HOME EXERCISE PROGRAM: Access Code: [email or web address] URL: [email or web address] Date: 06/08/2024 Prepared by: Mabel Kiang  Exercises - 90/90 SI Joint Self-Correction with Dowel  - 1 x daily - 2 x weekly - 1 sets - 5 reps - 8s hold - Hooklying SI Joint Self-Correction  - 1 x daily - 5 x weekly - 2 sets - 12 reps - 3s hold - Hooklying Isometric Hip Abduction Adduction with Belt and Ball  - 1 x daily - 5 x weekly - 2 sets - 12 reps - 3s hold ---------------------------------------------------------------------------------------------  ASSESSMENT:  CLINICAL IMPRESSION: Pt attended physical therapy session for continuation of treatment regarding LBP. Today's treatment focused on improvement of  proximal hip strength, posterior chain motility, and lumbar stability. Pt attended with resolution of SIJ pain, however onset of lumbar muscle pain which seems to stem from QL.  Pt showed great tolerance to administered treatment with minimal adverse effects by the end of session. Skilled intervention was utilized via activity modification for pt tolerance with task completion, functional progression/regression promoting best outcomes inline with current rehab goals, as well as minimal verbal/tactile cuing alongside no physical assistance for safe and appropriate performance of today's activities. Continue with therapeutic focus on current POC outline.   Eval impression (06/08/2024): Pt. attended today's physical therapy session for evaluation of low back pain. Pt has complaints of pinpoint pain in R lower back separate from previous lumbar dysfunction leading to 2 level fusion at  L3/L4. Pt has notable deficits with a R posterior pelvic obliquity, global hip strength, core strength, and core contraction patterns.  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 Pt would benefit from therapeutic focus on R SIJ manipulation with P>A focus, lumbo pelvic stabilization, and dynamic core strengthening/contraction pattern practice.  Treatment performed today focused on pt education detailed in the objective. Pt demonstrated good understanding of education provided. required minimal v/t cues and no assistance for appropriate performance with today's activities. Pt requires the intervention of skilled outpatient physical therapy to address the aforementioned deficits and progress towards a functional level in line with therapeutic goals.    OBJECTIVE IMPAIRMENTS: decreased activity tolerance, decreased mobility, difficulty walking, decreased ROM, decreased strength, improper body mechanics, postural dysfunction, and pain.   ACTIVITY LIMITATIONS: sitting, squatting, stairs, locomotion level, and caring for others  PARTICIPATION LIMITATIONS: driving, shopping, community activity, and occupation  PERSONAL FACTORS: Age, Fitness, Past/current experiences, Time since onset of injury/illness/exacerbation, and 1 comorbidity: lumbar fusion are also affecting patient's functional outcome.   REHAB POTENTIAL: Good  CLINICAL DECISION MAKING: Stable/uncomplicated  EVALUATION COMPLEXITY: Low   GOALS: Goals reviewed with patient? YES  SHORT TERM GOALS: Target date: 06/29/2024  Pt will be independent with administered HEP to demonstrate the competency necessary for long term managemnet of symptoms at home. Baseline: Goal status: INITIAL  LONG TERM GOALS: Target date: 07/20/2024  Pt. Will achieve a MODI score of 5/50 (10%) as to demonstrate improvement in self-perceived functional ability with daily activities.  Baseline: 11/50 (22%) Goal status: INITIAL  2.  Pt will improve  Global hip strength to a 4+/5 to demonstrate improvement in strength for quality of motion and activity performance. Baseline: see obj chart Goal status: INITIAL  3.  Pt will improve Lumbar  AROM to 90% of standardized norms with less than 2/10 pain to demonstrate necessary mobility for high quality and safe ADLs  Baseline:  Goal status: INITIAL  4.  Pt will report pain levels improving during recreational to be less than or equal to 2/10 as to demonstrate improved tolerance with daily functional activities such as squatting and walking.  Baseline:  Goal status: INITIAL  ---------------------------------------------------------------------------------------------  PLAN:  PT FREQUENCY: 1-2x/week  PT DURATION: 6 weeks  PLANNED INTERVENTIONS: 97110-Therapeutic exercises, 97530- Therapeutic activity, [id number]- Neuromuscular re-education, 97535- Self Care, [id number]- Manual therapy, [phone]- Gait training, Patient/Family education, Stair training, Taping, Joint mobilization, Spinal mobilization, DME instructions, Cryotherapy, and Moist heat.  PLAN FOR NEXT SESSION: Review HEP, Begin POC as detailed in assessment   Date of referral: 05/22/2024 Referring provider: Jennetta Gerard BIRCH, NP Referring diagnosis? MRN #[id number] Treatment diagnosis? (if different than referring diagnosis) R SIJ dysfunction  What was this (referring dx) caused by? Surgery (Type: Lumbar Fusion)  Nature of Condition: Initial Onset (within last 3 months)   Laterality: Both  Current Functional Measure Score: Back Index 11/50 (22%)  Objective measurements identify impairments when they are compared to normal values, the uninvolved extremity, and prior level of function.  [x]  Yes  []  No  Objective assessment of functional ability: Minimal functional limitations   Briefly describe symptoms: pinpoint pain in lumbar spine  How did symptoms start: ongoing, no moi  Average pain intensity:  Last 24 hours: 2/10  Past week:  5/10  How often does the pt experience symptoms? Frequently  How much have the symptoms interfered with usual daily activities? Moderately  How has condition changed since care began at this facility? NA - initial visit  In general, how is the patients overall health? Good   BACK PAIN (STarT Back Screening Tool) Has pain spread down the leg(s) at some time in the last 2 weeks? n Has there been pain in the shoulder or neck at some time in the last 2 weeks? n Has the pt only walked short distances because of back pain? y Has patient dressed more slowly because of back pain in the past 2 weeks? y Does patient think it's not safe for a person with this condition to be physically active? y Does patient have worrying thoughts a lot of the time? y Does patient feel back pain is terrible and will never get any better? n Has patient stopped enjoying things they usually enjoy? y   Mabel Kiang, PT, DPT 06/19/2024, 8:24 AM

## 2024-06-22 ENCOUNTER — Ambulatory Visit

## 2024-07-01 ENCOUNTER — Ambulatory Visit: Admitting: Psychiatry

## 2024-08-03 ENCOUNTER — Ambulatory Visit (INDEPENDENT_AMBULATORY_CARE_PROVIDER_SITE_OTHER): Admitting: Psychiatry

## 2024-08-03 ENCOUNTER — Encounter: Payer: Self-pay | Admitting: Psychiatry

## 2024-08-03 DIAGNOSIS — F411 Generalized anxiety disorder: Secondary | ICD-10-CM

## 2024-08-03 DIAGNOSIS — F319 Bipolar disorder, unspecified: Secondary | ICD-10-CM | POA: Diagnosis not present

## 2024-08-03 MED ORDER — LITHIUM CARBONATE ER 450 MG PO TBCR
450.0000 mg | EXTENDED_RELEASE_TABLET | Freq: Every evening | ORAL | 1 refills | Status: DC
Start: 2024-08-03 — End: 2024-08-13

## 2024-08-03 MED ORDER — LORAZEPAM 0.5 MG PO TABS: 0.5000 mg | ORAL_TABLET | Freq: Two times a day (BID) | ORAL | 1 refills | Status: AC | PRN

## 2024-08-03 MED ORDER — LITHIUM CARBONATE ER 300 MG PO TBCR
300.0000 mg | EXTENDED_RELEASE_TABLET | Freq: Every evening | ORAL | 1 refills | Status: DC
Start: 2024-08-03 — End: 2024-08-13

## 2024-08-03 NOTE — Progress Notes (Signed)
 Shylo Dillenbeck [id number] 02/05/1951 73 y.o.  Subjective:   Patient ID:  Norvel Wenker is a 73 y.o. (DOB 1951-02-22) male.  Chief Complaint:  Chief Complaint  Patient presents with   Follow-up    HPI   Yulian Gosney presents to the office today for follow-up of depression and anxiety.    seen in March 16, 2019.  It was relatively stable except for marital stress.  No meds were changed.  09/15/2019 appointment with the following noted: Had Aflutter with successful ablation. Overall mood good without change since here.  Nothing's changed.  Including meds.  Handling stress of socialization and Covid very well.  No unusual issues.. Really slowed down watching the news bc creating stress. Not taking much Xanax .  No problems with the lithium .  No meds were changed.  03/14/2020 appointment with the following noted: Back fusion Christmas.  Doing great with that.  Dr. Mavis.  Riding bike in 3-4 weeks. 10 years ago would be down and thinking neg things when first awakens in morning and back that way again.  Stressor wife unhappy with her job and complains of it a lot and wife doesn't like things going on in the family relationships.  These don't help her dealing with him bc she doesn't treat him well.  Her work is getting better and that is helping so he's more hopeful.  Not really look for med changes. Rare Xanax  helps him remain calm with wife and tolerates it well.  Plan: no med changes except suggest vit D in winter  04/14/20 TC: Patient called and said that over the last two weeks he has been hearing what people say to him but can't understand the words or what they mean. He said it was like his brain is in a fog. He also said that his right hand is shaking or have tremors. He wants to know is this the cause of the lithium  . He just had a physical and all is good there MD response: Reduce lithium  from 750 mg to 600   mg daily and see if that helps.  06/14/2020 phone call patient stating  he started feeling bad in the morning after reducing lithium  to 600 mg daily and he increase it back to 750 mg.  He called complaining he was still having some depression.  It was suggested he move up his appointment.  09/12/20 appt with following noted: Currently back to 600 mg lithium .  Had gone back up to 750 mg.  Now doesn't think it  was meds making him feel bad and he got better and  Now back to 600 mg lithium  for 6 weeks.  Doing well mood wise now.  Same marital issues and handling it as best he can.  Journaling is therapeutic and grounding.  Want to talk about it but she is unapproachable.  Thinking about counseling again.  Wife Rock.  Says he shows her his best side everyday.  She refuses to be close to him or touch him.   Rare Xanax . Tremor gone. Plan: Overall not depressed.  No med changes today  01/09/2021 appointment with the following noted: In jan taking lithium  450 and level 0.5. Then started feeling more stressed with wife and decided to increase to  Wife gotten meaner and wants to talk down to hip and resents that.  Xanax  helps but not sure lithium  helps.  Only used about once weekly. When initially wakes thinks of wife and gets down.  Can't talk with wife bc she  gets angry.  He feels powerless to do anything about it. He feels wife has deep seated unknown resentment against him but refuses to go to counseling with him. Increased lithium  to 750 mg about Feb without any changes noticed.   Sleep good.   No excess alcohol. Plan: Continue lithium  but need to check labs.  03/13/2021 appt noted: Disc marital situation.   She denied resentment against him.  But then started a litany of resentments for years.  Felt good he was right.  She started to try  be nicer. Journals and it helps. But still feels terrible.  Has done the housework.  She wants to travel and he doesn't want to do it if she doesn't care for him. More impatient with drivers again lately.  I don't know how to take  the edge off that.  Blows horn too much.  Can be patient in other circumstances.  Gets annoyed with stoplights.  Depressed about situation with wife.  Doesn't think he's overall depressed.  Still awakens thinking of marriage.  Says he didn't have a good attitude about taking meds in the past and willing to retry meds. No sex and wife asexual. Not depressed but frustrated with marriage. Patient reports stable mood and denies depressed or irritable moods.  Patient denies any recent difficulty with anxiety.  Patient denies difficulty with sleep initiation or maintenance. Denies appetite disturbance.  Patient reports that energy and motivation have been good.  Patient denies any difficulty with concentration.  Patient denies any suicidal ideation. Plan: Continue lithium  750 mg daily. Start sertraline  25 mg 1/2 daily for 1 week then 1 daily for irritability and depressive sx  03/29/2021 phone call complaining of GI distress from sertraline  25 mg daily.  He is very med sensitive so he was encouraged to try 12.5 mg daily.  07/31/21 appt noted: Sertraline  for 2 weeks and stopped DT GI problems. Mood in general good.  Rad on bipolar disorder. Notices there's times he feels happy and does things he regrets the next day.  Not a big issue but at least I'm paying attention and notices.  Not going in as deep into depression.  Not as much letting wife getting to him. taking lithium  750 mg daily. Bummed he just turned 73 yo.  He's still biking. M had MI Seeing therapist Olivia [place]. No med changes today and he agrees with the exception of adding vitamin D  Continue lithium  750 mg daily.  10/19/21 appt ntoed: Lithium  0.8 Dr. Gwenevere office Mild intermittent intentional tremor.  Not severe. Real emotional through the holidays, overly, intense. Sentimental with movie.  Also get very mad but not at people but at things when doing projects.  Always polite in public. Therapy, [name],  has helped improve  how he handles Rock, coping better with it. Anxiety in waves.  A lot of stress with back pain with injections 1 week ago helped. Only thing that works is 1/2 Xanax  prn but still using bottle from April will work in 15 mins. Plan: No med changes today and he agrees with the exception of adding vitamin D  Continue lithium  750 mg daily. Lithium  level per PCP. Pt says it was good but would like to get it.  03/26/22 appt noted: Tested positive for Covid 2 weeks ago. No severe sx. Planning back surgery.  No Covid SX. Mood good last week. Therapist hleped a lot with coping with conflict with Rock.  Handling it better usually.  She gets into resentment  about things 40 years ago.  She has a list of resentments.  She weaponizes it.  Really pleased with therapy helpful. She never says I'm sorry. Rosaline Angus, PhD.  Then he gets upset and tremors then and gets anxious then. Uses Xanax  prn. Tolerates it. Still rides bike and takes care of his mother and functions well. Tremor otherwise manageable unless upset. Plan: Continue lithium  750 mg daily.  05/17/22 TC :  Pt LVM reporting  following visit about 6 weeks ago, tremors in arm and leg continued after increasing Lithium  to 750 mg. Pt decreased Lithium  to 600 mg tremors stopped this time and no increase in depression at this time. Pt requesting Rx for 600 mg to [facility] for 90 day.     07/27/22 TC;  Pt stated tremors stopped but he is having increased anxiety on 600 mg of lithium  please advise      08/03/22 TC:  Lihtium level 0.7 on 600 mg daily which is about what the level has remained over the last few years.  It is stable.  I don't think we should increase or he is likely to have tremors.  I suggest he use the Xanax  for the anxiety he has been noticing.     The rest of his labs were normal (including Cr and Ca) except he needs to follow up with his doctor about the red blood cells in his urine as his PCP suggested.  This is unrelated to lithium .      10/02/21 appt noted: Tremor went away with reduction in lithium .   Overthinking things and overly sensitive to things.  Very sensitive to the way she speaks to me.  Don't feel anxious.  Wonders if shouldn't have reduced the tremor. Counseling with marriage has helped him communicate but wife unchanged. Happier mood and more talkative if has alcohol but not drinking excessively. Back surgery went well. Xanax  can help his rumination. Plan: Alternative of gabapentin  off label for tinnitus, might also help with irritability and over thinking things.   He wants to pursue  Continue lithium  750 mg daily vs 600 mg daily.  750 mg has helped but caused tremor.  600 mg does not cause tremor.  10/26/2022 phone call: He stopped gabapentin  due to nausea.  Wants to increase lithium  to 750 because of PTSD symptoms and over thinking and anxiety. Plan: Agreed to increase lithium  to 750 mg daily.  Offered option of low-dose gabapentin  100 mg twice daily.  12/31/22 appt noted: Less tremor now than in the past using the IR lithium .   Increased back to lithium  750 mg HS.  Was stressed out with wife and wanted to increase it. Has had good counseling with Olivia Kirks.  Others see wife as angry and hot tempered.  She's incapable of being affectionate and attentive to his needs.  Lacking compassion.   Tinnitus under stress is louder.   Handles stress pretty well except strongest anxiety is driving.    Flying use to bother him as bad but better with last trip alone to [place].   Plan: Alternative of gabapentin  off label for tinnitus, might also help with irritability and over thinking things.   He wants to try again at lower dose 100-200 mg BID.   Continue lithium  750 mg daily.  750 mg has helped but caused tremor.  600 mg does not cause tremor.  07/03/23 appt noted: Meds:  lithium  750, Xanax  0.125 about 1/3 of days. Wife stressing him lately.  Changed person.  She's  a lot more negative than in the past.  She's not  sleeping much.   She is very involved with her sister who bought a house in [place].  Gets upset with her $ decisions which stress him. Benefit counseling and meds.  Letting things slide off his back.  Overall doing remarkably well.  Has CBT skills. Counseling 50 sessions. Battles some irritability but handling it well. Constant tinnitus.  03/16/24  Pt reporting has trouble getting to sleep but can stay asleep once he does get to sleep. 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 Reports marital issues. He is very irritable during conversation. Tried to review meds and when I asked him about alprazolam /Xanax  he said he didn't know what it was and later said he took Xanax  PRN. Lithium  750 mg.  He said he is not depressed, no anxiety.     MD resp:  The simplest solution is to take Xanax  1/2 -1 tablet at night when he has trouble going to sleep. It will work faster if he puts it under his tongue but he doesn't have to . He could keep it by his bed and only take it if not asleep in 20 mins or so.   03/31/24 appt noted:  Med: lithium  450 HS, prn alprazolam  Started waking ruminating on px with wife.  Then when out of bed it would stop. Tried changing timing of lithium  later and it seemed to help. Tendency to be moody at times during the day with off an don ruminating about marriage.  Chronic mood reactivity over wife's berating him.   Wonders about trying slow release version of lithium .   Will tend to go over conversations in his head.   Went to family counseling with wife.   Hesitate to take Xanax  bc sedating and hangover. Has done individual counseling too.  Will be ok with it if wife leaves but he doesn't want it to happen.  Wife doesn't want to do anything.    08/03/24 appt noted: Med: Lithium  CR 750 mg HS.  Alprazolam  0.25 vs lorazepam  0.5.   Never takes together. SE minimal tremor Tinnitus seems worse on slow acting than  fasting acting. He wants to experiment with fast acting vs XR lithium  as long acting total dose is the same. Uses alprazolam  when sx severe and wants to calm down. When wife starts argument he goes up to take lorazepam  bc it is milder and he can stay awake.   Both work well.  Was in [place] for 6 weeks with wife .  Good on the whole. Health ok generally but some back pain since [place] trip. Some racing heart when rides bike.    Still riding bike regularly.   Past psychiatric medication trials include :  venlafaxine, Zoloft , mirtazapine,  Viibryd which cause GI pain, Wellbutrin, duloxetine, Celexa, Lexapro, Trintellix.  Sertraline , nefazodone,  inVega, risperidone, Abilify, lamotrigine, carbamazepine, Trileptal, gabapentin  Lithium  600-750  Cerefolin NAC, Deplin,  Cytomel, buspirone,  propranolol   trazodone 25 mg which caused manic symptoms,    I am uncertain if he is taking Depakote before  Review of Systems:  Review of Systems  Cardiovascular:  Negative for chest pain.  Musculoskeletal:  Positive for arthralgias and back pain.  Neurological:  Positive for tremors and headaches.  Psychiatric/Behavioral:  Negative for agitation, behavioral problems, confusion, decreased concentration, dysphoric mood, hallucinations, self-injury, sleep disturbance and suicidal ideas. The patient is nervous/anxious.  The patient is not hyperactive.     Medications: I have reviewed the patient's current medications.  Current Outpatient Medications  Medication Sig Dispense Refill   acetaminophen  (TYLENOL ) 500 MG tablet Take 1,000 mg by mouth every 6 (six) hours as needed (pain.).     ALPRAZolam  (XANAX ) 0.25 MG tablet Take 1 tablet (0.25 mg total) by mouth 2 (two) times daily as needed for anxiety. 30 tablet 0   COVID-19 mRNA vaccine 2023-2024 (COMIRNATY ) syringe Inject into the muscle. 0.3 mL 0   COVID-19 mRNA vaccine 2023-2024 (COMIRNATY ) syringe Inject 0.3 mLs into the muscle. 0.3 mL 0    cyclobenzaprine  (FLEXERIL ) 5 MG tablet Take 1 tablet (5 mg total) by mouth 3 (three) times daily as needed for muscle spasms. 30 tablet 0   docusate sodium  (COLACE) 100 MG capsule Take 1 capsule (100 mg total) by mouth 2 (two) times daily. 60 capsule 0   gabapentin  (NEURONTIN ) 100 MG capsule 1 twice daily for 3 days then 2 twice daily for 3 days then 3 twice daily. 90 capsule 0   lithium  carbonate (ESKALITH ) 450 MG ER tablet Take 1 tablet (450 mg total) by mouth at bedtime. 90 tablet 1   lithium  carbonate (LITHOBID ) 300 MG ER tablet Take 1 tablet (300 mg total) by mouth at bedtime. 90 tablet 1   LORazepam  (ATIVAN ) 0.5 MG tablet Take 1 tablet (0.5 mg total) by mouth 2 (two) times daily as needed for anxiety. 30 tablet 1   No current facility-administered medications for this visit.    Medication Side Effects: None except mild GI  Allergies:  Allergies  Allergen Reactions   Statins     Past Medical History:  Diagnosis Date   Arthritis    Depression    Dysrhythmia    aflutter   PAF (paroxysmal atrial fibrillation) (HCC)    Palpitations     Family History  Problem Relation Age of Onset   Atrial fibrillation Father        pacemaker   Heart disease Paternal Grandmother        pacemaker    Social History   Socioeconomic History   Marital status: Married    Spouse name: Not on file   Number of children: 2   Years of education: Not on file   Highest education level: Not on file  Occupational History   Not on file  Tobacco Use   Smoking status: Never   Smokeless tobacco: Never  Vaping Use   Vaping status: Never Used  Substance and Sexual Activity   Alcohol use: Yes    Alcohol/week: 3.0 standard drinks of alcohol    Types: 2 Glasses of wine, 1 Cans of beer per week    Comment: daily   Drug use: Never   Sexual activity: Not on file  Other Topics Concern   Not on file  Social History Narrative   Lives at home with wife.  Retired futures trader.    Social Drivers of  Corporate Investment Banker Strain: Not on file  Food Insecurity: Not on file  Transportation Needs: Not on file  Physical Activity: Not on file  Stress: Not on file  Social Connections: Not on file  Intimate Partner Violence: Not on file    Past Medical History, Surgical history, Social history, and Family history were reviewed and updated as appropriate.   W has no immediate plans to retire.  Please see review of systems for further details on the patient's review from today.  Objective:   Physical Exam:  There were no vitals taken for this visit.  Physical Exam Constitutional:      General: He is not in acute distress. Musculoskeletal:        General: No deformity.  Neurological:     Mental Status: He is alert and oriented to person, place, and time.     Coordination: Coordination normal.     Gait: Gait normal.  Psychiatric:        Attention and Perception: He is attentive.        Mood and Affect: Mood is not anxious or depressed. Affect is not labile, blunt, angry or inappropriate.        Speech: Speech normal.        Behavior: Behavior normal.        Thought Content: Thought content normal. Thought content is not delusional. Thought content does not include homicidal or suicidal ideation. Thought content does not include suicidal plan.        Cognition and Memory: Cognition normal.        Judgment: Judgment normal.     Comments: Insight intact. No auditory or visual hallucinations.  Better irritable intentionally with effort.  and anxiety  Residual dep mainly just AM and when wife gets angry at him    Lab Review:     Component Value Date/Time   NA 140 03/29/2022 1030   NA 140 07/21/2019 1001   K 3.8 03/29/2022 1030   CL 106 03/29/2022 1030   CO2 24 03/29/2022 1030   GLUCOSE 101 (H) 03/29/2022 1030   BUN 15 03/29/2022 1030   BUN 15 07/21/2019 1001   CREATININE 0.99 03/29/2022 1030   CALCIUM 9.5 03/29/2022 1030   PROT 6.8 09/24/2019 0832   PROT 6.5  07/07/2019 1106   ALBUMIN 4.6 09/24/2019 0832   ALBUMIN 4.6 07/07/2019 1106   AST 20 09/24/2019 0832   ALT 22 09/24/2019 0832   ALKPHOS 43 09/24/2019 0832   BILITOT 1.7 (H) 09/24/2019 0832   BILITOT 1.5 (H) 07/07/2019 1106   GFRNONAA >60 03/29/2022 1030   GFRAA >60 10/01/2019 0323       Component Value Date/Time   WBC 6.2 03/29/2022 1030   RBC 4.47 03/29/2022 1030   HGB 14.0 03/29/2022 1030   HGB 14.7 07/21/2019 1001   HCT 40.5 03/29/2022 1030   HCT 42.0 07/21/2019 1001   PLT 169 03/29/2022 1030   PLT 189 07/21/2019 1001   MCV 90.6 03/29/2022 1030   MCV 90 07/21/2019 1001   MCH 31.3 03/29/2022 1030   MCHC 34.6 03/29/2022 1030   RDW 12.0 03/29/2022 1030   RDW 11.8 07/21/2019 1001   LYMPHSABS 1.5 09/24/2019 0832   LYMPHSABS 1.6 07/07/2019 1106   MONOABS 0.5 09/24/2019 0832   EOSABS 0.4 09/24/2019 0832   EOSABS 0.3 07/07/2019 1106   BASOSABS 0.1 09/24/2019 0832   BASOSABS 0.1 07/07/2019 1106    No results found for: POCLITH, LITHIUM    No results found for: PHENYTOIN, PHENOBARB, VALPROATE, CBMZ  Last lithium  level May 13, 2018 on 750 mg lithium  per day was 0.7  Lithium  September 18, 2019= 0.8 Lithium  03/2020 = 0.72   Labs received did not dated April 07, 2019: Normal BMP with creatinine 1.0 calcium 9.6.  Normal CBC and lipid profile.  Normal TSH Vitamin D 36.9 Testosterone 7.6 with normal 6.6-18.1 Lithium  0.8  10/06/20 lithium  0.5 on 450 mg daily  03/08/2021 lithium  level 0.8 on 750 mg daily Dr.Perini  Lithium  0.8 Dr.  Perrini's office again since here on 750 mg daily.  Jan 2023  Lihtium level 0.7 on 600 mg daily   Lithium  0.8 Dr. Gwenevere office again since here on 750 mg daily.  Jan 2023 Lithium  0.7 Dr. Gwenevere office again since here on 750 mg daily.  Nov 2023 Lithium  0.6 on 05/13/23 on 750 mg 0.8 on 06/18/24 on 750 mg   Assessment: Plan:   Lyn was seen today for follow-up.  Diagnoses and all orders for this visit:  Generalized anxiety  disorder -     LORazepam  (ATIVAN ) 0.5 MG tablet; Take 1 tablet (0.5 mg total) by mouth 2 (two) times daily as needed for anxiety.  Bipolar affective disorder, rapid cycling (HCC) -     lithium  carbonate (LITHOBID ) 300 MG ER tablet; Take 1 tablet (300 mg total) by mouth at bedtime. -     lithium  carbonate (ESKALITH ) 450 MG ER tablet; Take 1 tablet (450 mg total) by mouth at bedtime.    Medication sensitive but tolerates lithium  well.  30 min   Long history of rapid cycling bipolar 2 disorder and anxieties.  He is medication sensitive and is not typically stable for very long. He has significant benefit from the lithium  for his depression which is very helpful.    He acknowledges the benefit of the lithium  overall and that his overall mood has been good for the last several months. Disc mood cycling and he's getting better at recognizing up cycles which are not severe. He tends to stay busy with retirement and cycles and it has helped.  Still tendency to be dep first thing in the morning.  Had neuropsych testing at wife's request in Feb 2025 and it was normal.   MRI contrasted without acute concerns.    Overall doing well.  He has tried multiple medications and lithium  has been the best.   Disc heart issues and needs FU.  He agrees. Had afib and ablation and hasn't resolved.  Marital stresses chronic but no worse.  Stress dealing with wife.  Supportive therapy since dealing with this.   His counseling helped.    continue ER lithium  750 mg nightly to add   Accepting of tremor in R hand only.    Lithium  level per PCP.  Lithium  0.8 Dr. Gwenevere office again since here on 750 mg daily.  11/2023  He might retry gabapentin  for tinnitus.  Counseled patient regarding potential benefits, risks, and side effects of lithium  to include potential risk of lithium  affecting thyroid and renal function.  Discussed need for periodic lab monitoring to determine drug level and to assess for potential  adverse effects.  Counseled patient regarding signs and symptoms of lithium  toxicity and advised that they notify office immediately or seek urgent medical attention if experiencing these signs and symptoms.  Patient advised to contact office with any questions or concerns. Disc poss lithium  tremor and it's impact.  Consider Rexulti, Vraylar, clonidine  Change alprazolam  to lorazepam   for less sedation 0.25-0.5 mg prn We discussed the short-term risks associated with benzodiazepines including sedation and increased fall risk among others.  Discussed long-term side effect risk including dependence, potential withdrawal symptoms, and the potential eventual dose-related risk of dementia.  But recent studies from 2020 dispute this association between benzodiazepines and dementia risk. Newer studies in 2020 do not support an association with dementia.  Supportive therapy dealing with wife's accusation.   Therapy is helping.    No med changes  3-6 mos  Lorene Macintosh,  MD, DFAPA    Please see After Visit Summary for patient specific instructions.  Future Appointments  Date Time Provider Department Center  08/11/2024  2:00 PM Teresa Dorsey [facility]  08/18/2024  2:20 PM Lavona Agent, MD [facility]      No orders of the defined types were placed in this encounter.      -------------------------------

## 2024-08-10 ENCOUNTER — Telehealth: Payer: Self-pay | Admitting: Psychiatry

## 2024-08-10 NOTE — Telephone Encounter (Signed)
 Pt LVM  stating that he would like the Lithium  ER changed to IR and sent to [facility].  He thinks IR has worked better for him taking it at night than the ER taking it in the morning.   Next appt 3/3

## 2024-08-10 NOTE — Telephone Encounter (Signed)
 Pt prefers Lithium  IR. He said he hits him differently than the ER and he prefers it. He is taking 450 mg QPM. He said Rx needs to go to [facility] and not [facility].

## 2024-08-11 ENCOUNTER — Ambulatory Visit: Attending: Cardiology

## 2024-08-11 DIAGNOSIS — E785 Hyperlipidemia, unspecified: Secondary | ICD-10-CM | POA: Diagnosis not present

## 2024-08-11 MED ORDER — ROSUVASTATIN CALCIUM 5 MG PO TABS: 5.0000 mg | ORAL_TABLET | Freq: Every day | ORAL | 2 refills | Status: DC

## 2024-08-11 NOTE — Progress Notes (Signed)
 Patient ID: [name]                 DOB: 1951-05-12                    MRN: [id number]      HPI: Nathaniel Bush is a 72 y.o. male patient referred to lipid clinic by Dr. Lavona. PMH is significant for HLD, elevated coronary calcium score: 89.4 - 48th percentile (03/2020), atrial fibrillation, and depression.   Patient presents today in good spirits. He reports a general dislike for taking medications. He was initially prescribed rosuvastatin in 11/2022 but chose not to take it. He was later switched to atorvastatin 08/2023 after reporting myalgias with rosuvastatin; however he admits that he did not actually take the rosuvastatin and didn't give it a fair trial.   Patient reports that approximately two months ago, while taking atorvastatin, he experienced tingling sensations and felt fatigued during a bike ride. He suspects these symptoms may have been related to the medication. He discontinued atorvastatin after biking incident and acknowledges that his adherence at the time may have been inconsistent.  He states that he maintains a fairly healthy diet and stays physically active, and therefore did not feel it was necessary to initiate statin therapy. However, after gaining better understading of the significance of his calcium score and most recent lipid panel, he is now open to retrying statin therapy.  We reviewed options for lowering LDL cholesterol, including ezetimibe, PCSK-9 inhibitors, bempedoic acid and inclisiran in the event that a statin retrial is unsuccessful. Discussed mechanisms of action, dosing, side effects and potential decreases in LDL cholesterol.  Also reviewed cost information and potential options for patient assistance.   Current Medications: none Intolerances: atorvastatin (myalgias), rosuvastatin (myalgias) Risk Factors: elevated coronary calcium score: 89.4 - 48th percentile (03/2020), family hx of heart disease LDL goal: < 70; 10 year ASCVD risk 28.1% Lipid  panel (07/2023): Chol 159, Trig 109, HDL 54, LDL 85 Liver enzymes (01/2024): ALT 16, AST 19, Alk phos 47  Diet:  Breakfast: cereal with pumpkin seed, soy milk, black coffee Lunch/Dinner: whole grain bread with peanut butter for lunch; salad and baked protein for dinner; patient report no fried food and hardly any sweets   Exercise:  Patient reports that he is active and rides bike several times per week.  Family History:  Relation Problem Comments  Mother Metallurgist)   Father (Deceased) Atrial fibrillation pacemaker    Paternal Grandmother (Deceased) Heart disease pacemaker      Social History:  Alcohol: 3 standard drinks per week Smoking:none  Labs:  Lipid Panel     Component Value Date/Time   CHOL 153 03/04/2020 0856   TRIG 69 03/04/2020 0856   HDL 63 03/04/2020 0856   CHOLHDL 2.4 03/04/2020 0856   LDLCALC 76 03/04/2020 0856   LABVLDL 14 03/04/2020 0856    Past Medical History:  Diagnosis Date   Arthritis    Depression    Dysrhythmia    aflutter   PAF (paroxysmal atrial fibrillation) (HCC)    Palpitations     Current Outpatient Medications on File Prior to Visit  Medication Sig Dispense Refill   acetaminophen  (TYLENOL ) 500 MG tablet Take 1,000 mg by mouth every 6 (six) hours as needed (pain.).     ALPRAZolam  (XANAX ) 0.25 MG tablet Take 1 tablet (0.25 mg total) by mouth 2 (two) times daily as needed for anxiety. 30 tablet 0   COVID-19 mRNA vaccine 2023-2024 (COMIRNATY ) syringe Inject  into the muscle. 0.3 mL 0   COVID-19 mRNA vaccine 2023-2024 (COMIRNATY ) syringe Inject 0.3 mLs into the muscle. 0.3 mL 0   cyclobenzaprine  (FLEXERIL ) 5 MG tablet Take 1 tablet (5 mg total) by mouth 3 (three) times daily as needed for muscle spasms. 30 tablet 0   docusate sodium  (COLACE) 100 MG capsule Take 1 capsule (100 mg total) by mouth 2 (two) times daily. 60 capsule 0   gabapentin  (NEURONTIN ) 100 MG capsule 1 twice daily for 3 days then 2 twice daily for 3 days then 3 twice daily.  90 capsule 0   lithium  carbonate (ESKALITH ) 450 MG ER tablet Take 1 tablet (450 mg total) by mouth at bedtime. 90 tablet 1   lithium  carbonate (LITHOBID ) 300 MG ER tablet Take 1 tablet (300 mg total) by mouth at bedtime. 90 tablet 1   LORazepam  (ATIVAN ) 0.5 MG tablet Take 1 tablet (0.5 mg total) by mouth 2 (two) times daily as needed for anxiety. 30 tablet 1   No current facility-administered medications on file prior to visit.    Allergies  Allergen Reactions   Statins     Assessment/Plan:  1. Hyperlipidemia -  Problem  Dyslipidemia   Dyslipidemia Assessment:  LDL goal: < 70 mg/dl; last LDLc 85 mg/dl (89/7975) Liver enzymes (01/2024): ALT 16, AST 19, Alk phos 47 WNL Patient experienced an episode of weakness during a bike ride, possibly related to atorvastatin Patient willing to retry rosuvastatin at a low dose as he admits to not giving rosuvastatin a fair trial  Plan: Obtain baseline fasting lipid panel at upcoming cardiology visit (08/18/24) Begin rosuvastatin 5 mg daily after obtaining lipid panel next week  Repeat lipid lab and hepatic panel due in 3 months after starting statin Continue healthy lifestyle modifications (balanced diet, regular physical activity)     Thank you,  Aimi Essner E. Shon Mansouri, Pharm.D Ceres Elspeth BIRCH. [facility] [address], [place], [place] [address] Phone: [phone]; Fax: [phone]

## 2024-08-11 NOTE — Patient Instructions (Addendum)
 Your Results:             Your most recent labs Goal  Total Cholesterol 159 < 200  Triglycerides 109 < 150  HDL (happy/good cholesterol) 54 > 40  LDL (lousy/bad cholesterol 85 < 70   Medication changes: Obtain fasting lipid panel on 08/18/2024 Start rosuvastatin 5 mg daily after you obtain lipid panel on 08/18/2024 Obtain follow up lipid panel in 3 months - I will call you and remind you to obtain follow up lipid panel   Please reach out to me if you have any questions!  Latresha Yahr E. Navreet Bolda, Pharm.D Seymour Elspeth BIRCH. [facility] [address], [place], [place] [address] Phone: [phone]; Fax: [phone]

## 2024-08-11 NOTE — Assessment & Plan Note (Addendum)
 Assessment:  LDL goal: < 70 mg/dl; last LDLc 85 mg/dl (89/7975) Liver enzymes (01/2024): ALT 16, AST 19, Alk phos 47 WNL Patient experienced an episode of weakness during a bike ride, possibly related to atorvastatin Patient willing to retry rosuvastatin at a low dose as he admits to not giving rosuvastatin a fair trial  Plan: Obtain baseline fasting lipid panel at upcoming cardiology visit (08/18/24) Begin rosuvastatin 5 mg daily after obtaining lipid panel next week  Repeat lipid lab and hepatic panel due in 3 months after starting statin Continue healthy lifestyle modifications (balanced diet, regular physical activity)

## 2024-08-12 ENCOUNTER — Telehealth: Payer: Self-pay | Admitting: Cardiology

## 2024-08-12 NOTE — Telephone Encounter (Signed)
 Returned patient's call. He called to ask if he could have his fasting lipid panel done this week instead of at next [facility] cardiology appointment as he originally stated. I informed him that this was fine and no appointment was required. The patient was instructed to bring the lab slip to [facility].

## 2024-08-12 NOTE — Telephone Encounter (Signed)
 Patient stated he is returning Nathaniel Bush's, RPH call.

## 2024-08-13 ENCOUNTER — Other Ambulatory Visit: Payer: Self-pay | Admitting: Psychiatry

## 2024-08-13 MED ORDER — LITHIUM CARBONATE 150 MG PO CAPS: 450.0000 mg | ORAL_CAPSULE | Freq: Every evening | ORAL | 0 refills | Status: DC

## 2024-08-13 NOTE — Telephone Encounter (Signed)
 Sent!

## 2024-08-16 NOTE — Progress Notes (Deleted)
  Cardiology Office Note:   Date:  08/16/2024  ID:  Nathaniel Bush, DOB 1950/10/05, MRN [id number] PCP: Shayne Anes, MD  [facility] Providers Cardiologist:  Lynwood Schilling, MD {  History of Present Illness:   Nathaniel Bush is a 73 y.o. male who follow up of atrial fib. He is status post atrial flutter ablation. He had a coronary calcium score which was 89.4 which put him at the 48th percentile. Since I last saw him he has done well. He rides his bike. He rides a stationary bicycle. He denies any new cardiovascular symptoms. He denies any chest pressure, neck or arm discomfort. He is had no new palpitations, presyncope or syncope. He has had no weight gain or edema.   ROS: ***  Studies Reviewed:    EKG:       ***  Risk Assessment/Calculations:   {Does this patient have ATRIAL FIBRILLATION?:[id number]} No BP recorded.  {Refresh Note OR Click here to enter BP  :1}***        Physical Exam:   VS:  There were no vitals taken for this visit.   Wt Readings from Last 3 Encounters:  04/21/24 172 lb (78 kg)  09/11/23 172 lb 12.8 oz (78.4 kg)  08/23/23 173 lb (78.5 kg)     GEN: Well nourished, well developed in no acute distress NECK: No JVD; No carotid bruits CARDIAC: ***RR, *** murmurs, rubs, gallops RESPIRATORY:  Clear to auscultation without rales, wheezing or rhonchi  ABDOMEN: Soft, non-tender, non-distended EXTREMITIES:  No edema; No deformity   ASSESSMENT AND PLAN:   ATRIAL FLUTTER:  ***    He is not had any symptomatic paroxysms.  He has rare ectopy.  No change in therapy.   ELEVATED CORONARY CALCIUM:   ***   We are talking about primary risk reduction.  He was put on Crestor but he really did not tolerate this.  He has not been switched to Lipitor which he has not started yet.  He wanted to talk about this.  I gave him an LDL goal in the 50s but I do not know the most recent labs.  He does his level is not a bad and he will start the Lipitor and follow-up with Perini,  Mark, MD    DYSLIPIDEMIA:  Goal is LDL less than 70.  He was seen in the Pharm D Clinic.    ***  As above.   BRADYCARDIA:   ***    He has had no symptomatic bradycardia.  No change in therapy.        Follow up ***  Signed, Lynwood Schilling, MD

## 2024-08-18 ENCOUNTER — Ambulatory Visit: Attending: Cardiology | Admitting: Cardiology

## 2024-08-18 ENCOUNTER — Encounter: Payer: Self-pay | Admitting: Cardiology

## 2024-08-18 DIAGNOSIS — E785 Hyperlipidemia, unspecified: Secondary | ICD-10-CM

## 2024-08-18 DIAGNOSIS — I4892 Unspecified atrial flutter: Secondary | ICD-10-CM

## 2024-08-18 DIAGNOSIS — R931 Abnormal findings on diagnostic imaging of heart and coronary circulation: Secondary | ICD-10-CM

## 2024-08-18 DIAGNOSIS — R001 Bradycardia, unspecified: Secondary | ICD-10-CM

## 2024-08-19 LAB — LIPID PANEL
Chol/HDL Ratio: 2.6 ratio (ref 0.0–5.0)
Cholesterol, Total: 151 mg/dL (ref 100–199)
HDL: 57 mg/dL (ref >39)
LDL Chol Calc (NIH): 79 mg/dL (ref 0–99)
Triglycerides: 78 mg/dL (ref 0–149)
VLDL Cholesterol Cal: 15 mg/dL (ref 5–40)

## 2024-08-23 ENCOUNTER — Ambulatory Visit: Payer: Self-pay | Admitting: Cardiology

## 2024-08-24 NOTE — Telephone Encounter (Signed)
 I agree, rosuvastatin  10 mg is ideal. Saw patient 2 weeks ago and only willing to start rosuvastatin  5 mg. Admits to not giving rosuvastatin  and atorvastatin a fair trial in the past, had some intolerances. This was a baseline lipid panel before starting therapy. Plan is to recheck in 3-4 mo, assess tolerability and reinforce the need for a higher dose to reach goal. Thanks.

## 2024-08-24 NOTE — Progress Notes (Unsigned)
 Cardiology Office Note:   Date:  08/26/2024  ID:  Nathaniel Bush, DOB 01-Mar-1951, MRN [id number] PCP: Shayne Anes, MD  [facility] Providers Cardiologist:  Lynwood Schilling, MD {  History of Present Illness:   Nathaniel Bush is a 73 y.o. male  who follow up of atrial fib.   He is status post atrial flutter ablation.  He had a coronary calcium  score which was 89.4 which put him at the 48th percentile.  Since I last saw him he had an episode of tachycardia and presyncope.  He could not catch this on his watch.   He has had a couple more episodes of these palpitations.  Once he was working up an incline in [place] this summer and he felt them.  He feels a faint headedness and lightheadedness.  His watch tells him he is in atrial fibrillation sometimes 50% of the time but he is not to catch any rhythm strips.  He is in regular rhythm here.  He has not had any new chest pressure, neck or arm discomfort.  He is not having any new reproducible shortness of breath, PND or orthopnea.  He can pedal his outside and stationary bicycles without bringing on any symptoms but then again might have some tachypalpitations.  He is not sure that it feels like his flutter.  He thinks it feels like maybe the beginning of his flutter.  It does not sound like it is been sustained other than a few times as mentioned in the previous note.  ROS: As stated in the HPI and negative for all other systems.  Studies Reviewed:    EKG:   EKG Interpretation Date/Time:  Wednesday August 26 2024 08:52:19 EST Ventricular Rate:  57 PR Interval:  256 QRS Duration:  106 QT Interval:  422 QTC Calculation: 410 R Axis:   -36  Text Interpretation: Sinus bradycardia with 1st degree A-V block with Premature atrial complexes Left axis deviation Poor anterior R wave progression When compared with ECG of 21-Apr-2024 15:33, Premature atrial complexes are now Present Confirmed by Schilling Lynwood ([id number]) on 08/26/2024 9:16:47  AMNA  Risk Assessment/Calculations:    CHA2DS2-VASc Score = 1   This indicates a 0.6% annual risk of stroke. The patient's score is based upon: CHF History: 0 HTN History: 0 Diabetes History: 0 Stroke History: 0 Vascular Disease History: 0 Age Score: 1 Gender Score: 0           Physical Exam:   VS:  BP 129/65 (BP Location: Right Arm, Patient Position: Sitting, Cuff Size: Normal)   Pulse (!) 57   Ht 5' 10 (1.778 m)   Wt 175 lb 11.2 oz (79.7 kg)   SpO2 98%   BMI 25.21 kg/m    Wt Readings from Last 3 Encounters:  08/26/24 175 lb 11.2 oz (79.7 kg)  04/21/24 172 lb (78 kg)  09/11/23 172 lb 12.8 oz (78.4 kg)     GEN: Well nourished, well developed in no acute distress NECK: No JVD; No carotid bruits CARDIAC: RRR, no murmurs, rubs, gallops RESPIRATORY:  Clear to auscultation without rales, wheezing or rhonchi  ABDOMEN: Soft, non-tender, non-distended EXTREMITIES:  No edema; No deformity   ASSESSMENT AND PLAN:   ATRIAL FLUTTER:   The patient does feel palpitations and has been having some abnormal readings on his watch.  He is not able to give me tracings.  I am gena have him wear a 2-week ZIO.  TACHYCARDIA AND PRESYNCOPE: As above.  ELEVATED CORONARY CALCIUM :  Participating in aggressive risk reduction.  He has no ongoing symptoms.  No change in therapy.  DYSLIPIDEMIA:    The patient stopped his Crestor  because he thought he was getting urinary symptoms.  He wants to try something else.  His LDL is not far from target although this was on the statin.  He was 79 and HDL of 57.  I am going to see how he does with pravastatin  80 mg since he has been sensitive to medications.  He will come back in 3 months for lipid profile.erapy.     Follow up with APP in 6 months  Signed, Lynwood Schilling, MD

## 2024-08-26 ENCOUNTER — Ambulatory Visit

## 2024-08-26 ENCOUNTER — Encounter: Payer: Self-pay | Admitting: Cardiology

## 2024-08-26 ENCOUNTER — Ambulatory Visit: Attending: Cardiology | Admitting: Cardiology

## 2024-08-26 VITALS — BP 129/65 | HR 57 | Ht 70.0 in | Wt 175.7 lb

## 2024-08-26 DIAGNOSIS — R002 Palpitations: Secondary | ICD-10-CM

## 2024-08-26 DIAGNOSIS — R931 Abnormal findings on diagnostic imaging of heart and coronary circulation: Secondary | ICD-10-CM | POA: Diagnosis not present

## 2024-08-26 DIAGNOSIS — E785 Hyperlipidemia, unspecified: Secondary | ICD-10-CM

## 2024-08-26 DIAGNOSIS — R001 Bradycardia, unspecified: Secondary | ICD-10-CM | POA: Diagnosis not present

## 2024-08-26 DIAGNOSIS — I4892 Unspecified atrial flutter: Secondary | ICD-10-CM | POA: Diagnosis not present

## 2024-08-26 MED ORDER — PRAVASTATIN SODIUM 80 MG PO TABS: 80.0000 mg | ORAL_TABLET | Freq: Every evening | ORAL | 3 refills | Status: AC

## 2024-08-26 NOTE — Progress Notes (Unsigned)
 Enrolled patient for a 14 day Zio XT  monitor to be mailed to patients home

## 2024-08-26 NOTE — Patient Instructions (Signed)
 Medication Instructions:  Stop Crestor  Start Pravastatin  80 mg once daily *If you need a refill on your cardiac medications before your next appointment, please call your pharmacy*  Lab Work: Fasting lipid panel in 3 months at [facility] If you have labs (blood work) drawn today and your tests are completely normal, you will receive your results only by: MyChart Message (if you have MyChart) OR A paper copy in the mail If you have any lab test that is abnormal or we need to change your treatment, we will call you to review the results.  Testing/Procedures: 14 Day Zio Heart Monitor Your physician has requested that you wear a Zio heart monitor for __14___ days. This will be mailed to your home with instructions on how to apply the monitor and how to return it when finished. Please allow 2 weeks after returning the heart monitor before our office calls you with the results.   Follow-Up: At [facility], you and your health needs are our priority.  As part of our continuing mission to provide you with exceptional heart care, our providers are all part of one team.  This team includes your primary Cardiologist (physician) and Advanced Practice Providers or APPs (Physician Assistants and Nurse Practitioners) who all work together to provide you with the care you need, when you need it.  Your next appointment:   6 month(s)  Provider:   Josefa Beauvais, NP  We recommend signing up for the patient portal called MyChart.  Sign up information is provided on this After Visit Summary.  MyChart is used to connect with patients for Virtual Visits (Telemedicine).  Patients are able to view lab/test results, encounter notes, upcoming appointments, etc.  Non-urgent messages can be sent to your provider as well.   To learn more about what you can do with MyChart, go to [email or web address].   Other Instructions ZIO XT- Long Term Monitor Instructions  Your physician has requested you wear a ZIO  patch monitor for 14 days.  This is a single patch monitor. [employer] supplies one patch monitor per enrollment. Additional stickers are not available. Please do not apply patch if you will be having a Nuclear Stress Test,  Echocardiogram, Cardiac CT, MRI, or Chest Xray during the period you would be wearing the  monitor. The patch cannot be worn during these tests. You cannot remove and re-apply the  ZIO XT patch monitor.  Your ZIO patch monitor will be mailed 3 day USPS to your address on file. It may take 3-5 days  to receive your monitor after you have been enrolled.  Once you have received your monitor, please review the enclosed instructions. Your monitor  has already been registered assigning a specific monitor serial # to you.  Billing and Patient Assistance Program Information  We have supplied [employer] with any of your insurance information on file for billing purposes. [employer] offers a sliding scale Patient Assistance Program for patients that do not have  insurance, or whose insurance does not completely cover the cost of the ZIO monitor.  You must apply for the Patient Assistance Program to qualify for this discounted rate.  To apply, please call [employer] at [phone], select option 4, select option 2, ask to apply for  Patient Assistance Program. Meredeth will ask your household income, and how many people  are in your household. They will quote your out-of-pocket cost based on that information.  [employer] will also be able to set up a 74-month, interest-free payment plan if needed.  Applying the monitor   Shave hair from upper left chest.  Hold abrader disc by orange tab. Rub abrader in 40 strokes over the upper left chest as  indicated in your monitor instructions.  Clean area with 4 enclosed alcohol pads. Let dry.  Apply patch as indicated in monitor instructions. Patch will be placed under collarbone on left  side of chest with arrow pointing upward.  Rub patch adhesive  wings for 2 minutes. Remove white label marked 1. Remove the white  label marked 2. Rub patch adhesive wings for 2 additional minutes.  While looking in a mirror, press and release button in center of patch. A small green light will  flash 3-4 times. This will be your only indicator that the monitor has been turned on.  Do not shower for the first 24 hours. You may shower after the first 24 hours.  Press the button if you feel a symptom. You will hear a small click. Record Date, Time and  Symptom in the Patient Logbook.  When you are ready to remove the patch, follow instructions on the last 2 pages of Patient  Logbook. Stick patch monitor onto the last page of Patient Logbook.  Place Patient Logbook in the blue and white box. Use locking tab on box and tape box closed  securely. The blue and white box has prepaid postage on it. Please place it in the mailbox as  soon as possible. Your physician should have your test results approximately 7 days after the  monitor has been mailed back to [facility].  Call [facility] Customer Care at [phone] if you have questions regarding  your ZIO XT patch monitor. Call them immediately if you see an orange light blinking on your  monitor.  If your monitor falls off in less than 4 days, contact our Monitor department at [phone].  If your monitor becomes loose or falls off after 4 days call [employer] at [phone] for  suggestions on securing your monitor

## 2024-09-01 ENCOUNTER — Telehealth: Payer: Self-pay | Admitting: Psychiatry

## 2024-09-01 NOTE — Telephone Encounter (Signed)
 LVM to [facility]

## 2024-09-01 NOTE — Telephone Encounter (Signed)
 Patient lvm requesting a call back from the nurse to discuss medication. Last seen 08/03/24, follow up 12/01/24.

## 2024-09-02 NOTE — Telephone Encounter (Signed)
 Pt reports trying ER vs IR lithium . He reports he wanted to try ER because he read it was better for GI issues. He reports it has actually made them worse - has GI reflux. He said IR makes it hard for him to get to sleep, that he just lays in bed worrying and then he gets up in the morning the worry starts immediately. He is asking for suggestions as to what to do from here.

## 2024-09-02 NOTE — Telephone Encounter (Signed)
 Left second VM, also sent MyChart message.

## 2024-09-03 NOTE — Telephone Encounter (Signed)
 Sorry to hear that Lithium  ER caused reflux.  Be assured lithium  does not cause anxiety.  It just doesn't always help anxiety that much.  But lithium  has been the best mood med he has taken, so continue it in either form of lithium  he wants.  He can also take it any time of day he wants but likely will have less GI issues if the can take lithium  with food.

## 2024-09-03 NOTE — Telephone Encounter (Signed)
 Patient notified of recommendations.

## 2024-09-18 ENCOUNTER — Ambulatory Visit: Payer: Self-pay | Admitting: Cardiology

## 2024-09-28 ENCOUNTER — Telehealth: Payer: Self-pay | Admitting: Cardiology

## 2024-09-28 ENCOUNTER — Encounter: Payer: Self-pay | Admitting: Cardiology

## 2024-09-28 NOTE — Telephone Encounter (Signed)
 Patient called to follow-up on his heart monitor test results.

## 2024-09-28 NOTE — Telephone Encounter (Signed)
 Duplicate message. Replied and routed via [employer].

## 2024-09-30 NOTE — Telephone Encounter (Signed)
 Pt notified of Dr. Denver comments via MyChart. Last read by Ulyses Harbin at 5:21PM on 09/28/2024.

## 2024-10-05 DIAGNOSIS — R002 Palpitations: Secondary | ICD-10-CM | POA: Diagnosis not present

## 2024-10-08 ENCOUNTER — Telehealth: Payer: Self-pay | Admitting: Psychiatry

## 2024-10-08 NOTE — Telephone Encounter (Signed)
 See results follow up from 12/19

## 2024-10-08 NOTE — Telephone Encounter (Signed)
 Pt lvm needing rf on Lithium  and wants to know if he can get fast acting.

## 2024-10-09 ENCOUNTER — Other Ambulatory Visit: Payer: Self-pay | Admitting: Psychiatry

## 2024-10-09 DIAGNOSIS — F319 Bipolar disorder, unspecified: Secondary | ICD-10-CM

## 2024-10-09 MED ORDER — LITHIUM CARBONATE 150 MG PO CAPS: 750.0000 mg | ORAL_CAPSULE | Freq: Every evening | ORAL | 0 refills | Status: AC

## 2024-10-09 MED ORDER — LITHIUM CARBONATE 150 MG PO CAPS: 750.0000 mg | ORAL_CAPSULE | Freq: Every evening | ORAL | 0 refills | Status: DC

## 2024-10-09 NOTE — Telephone Encounter (Signed)
 Just spoke with Nathaniel Bush, reviewed your message with him, and he verbally confirmed understanding. I also asked about the mail-order pharmacy information, he needs it to go to [facility], not [facility].

## 2024-10-09 NOTE — Telephone Encounter (Signed)
 Ok.  Let him know I'll send in lithium  150 mg capsules, 5 nightly.

## 2024-10-09 NOTE — Telephone Encounter (Signed)
 Also, I assumed I was to send it to mail order, so that's what I did

## 2024-10-09 NOTE — Telephone Encounter (Signed)
 Pt called to check on the status of req

## 2024-10-09 NOTE — Telephone Encounter (Signed)
 See previous phone messages pt was reporting how the IR and XR work for him, now asking to change.

## 2024-10-10 NOTE — Telephone Encounter (Signed)
 Dr. Geoffry sent Rx to [facility].

## 2024-11-01 ENCOUNTER — Other Ambulatory Visit: Payer: Self-pay | Admitting: Psychiatry

## 2024-11-01 DIAGNOSIS — F319 Bipolar disorder, unspecified: Secondary | ICD-10-CM

## 2024-11-23 ENCOUNTER — Ambulatory Visit (HOSPITAL_BASED_OUTPATIENT_CLINIC_OR_DEPARTMENT_OTHER): Admitting: Physical Therapy

## 2024-12-01 ENCOUNTER — Ambulatory Visit: Admitting: Psychiatry

## 2025-02-23 ENCOUNTER — Ambulatory Visit: Admitting: General Practice
